# Patient Record
Sex: Female | Born: 1935 | Race: White | Hispanic: No | Smoking: Never smoker
Health system: Southern US, Community
[De-identification: ages and names within clinical notes are randomized; demographics above are authoritative.]

## PROBLEM LIST (undated history)

## (undated) ENCOUNTER — Emergency Department (HOSPITAL_COMMUNITY): Admission: EM | Payer: Medicare Other | Source: Home / Self Care

## (undated) DIAGNOSIS — D649 Anemia, unspecified: Secondary | ICD-10-CM

## (undated) DIAGNOSIS — C649 Malignant neoplasm of unspecified kidney, except renal pelvis: Secondary | ICD-10-CM

## (undated) DIAGNOSIS — S7290XA Unspecified fracture of unspecified femur, initial encounter for closed fracture: Secondary | ICD-10-CM

## (undated) DIAGNOSIS — R110 Nausea: Secondary | ICD-10-CM

## (undated) DIAGNOSIS — Z9289 Personal history of other medical treatment: Secondary | ICD-10-CM

## (undated) DIAGNOSIS — R262 Difficulty in walking, not elsewhere classified: Secondary | ICD-10-CM

## (undated) DIAGNOSIS — IMO0001 Reserved for inherently not codable concepts without codable children: Secondary | ICD-10-CM

## (undated) DIAGNOSIS — R296 Repeated falls: Secondary | ICD-10-CM

## (undated) DIAGNOSIS — C719 Malignant neoplasm of brain, unspecified: Secondary | ICD-10-CM

## (undated) DIAGNOSIS — F329 Major depressive disorder, single episode, unspecified: Secondary | ICD-10-CM

## (undated) DIAGNOSIS — C3490 Malignant neoplasm of unspecified part of unspecified bronchus or lung: Secondary | ICD-10-CM

## (undated) DIAGNOSIS — M6281 Muscle weakness (generalized): Secondary | ICD-10-CM

## (undated) DIAGNOSIS — F32A Depression, unspecified: Secondary | ICD-10-CM

## (undated) DIAGNOSIS — Z860100 Personal history of colon polyps, unspecified: Secondary | ICD-10-CM

## (undated) HISTORY — PX: NEPHRECTOMY: SHX65

## (undated) HISTORY — DX: Personal history of colonic polyps: Z86.010

## (undated) HISTORY — DX: Personal history of colon polyps, unspecified: Z86.0100

## (undated) HISTORY — DX: Nausea: R11.0

## (undated) HISTORY — DX: Reserved for inherently not codable concepts without codable children: IMO0001

## (undated) HISTORY — PX: WRIST FRACTURE SURGERY: SHX121

## (undated) HISTORY — DX: Anemia, unspecified: D64.9

## (undated) HISTORY — DX: Depression, unspecified: F32.A

## (undated) HISTORY — PX: ROTATOR CUFF REPAIR: SHX139

## (undated) HISTORY — PX: TOTAL HIP ARTHROPLASTY: SHX124

## (undated) HISTORY — PX: LUNG LOBECTOMY: SHX167

## (undated) HISTORY — PX: FOOT SURGERY: SHX648

## (undated) HISTORY — DX: Major depressive disorder, single episode, unspecified: F32.9

## (undated) SURGERY — Surgical Case
Anesthesia: *Unknown

---

## 1999-12-02 ENCOUNTER — Ambulatory Visit (HOSPITAL_COMMUNITY): Admission: RE | Admit: 1999-12-02 | Discharge: 1999-12-02 | Payer: Self-pay | Admitting: Urology

## 1999-12-02 ENCOUNTER — Encounter: Payer: Self-pay | Admitting: Urology

## 1999-12-05 ENCOUNTER — Encounter: Payer: Self-pay | Admitting: Urology

## 1999-12-09 ENCOUNTER — Encounter: Payer: Self-pay | Admitting: Urology

## 1999-12-09 ENCOUNTER — Inpatient Hospital Stay (HOSPITAL_COMMUNITY): Admission: RE | Admit: 1999-12-09 | Discharge: 1999-12-14 | Payer: Self-pay | Admitting: Urology

## 1999-12-09 ENCOUNTER — Encounter (INDEPENDENT_AMBULATORY_CARE_PROVIDER_SITE_OTHER): Payer: Self-pay | Admitting: *Deleted

## 2000-04-01 ENCOUNTER — Encounter: Payer: Self-pay | Admitting: Urology

## 2000-04-01 ENCOUNTER — Encounter: Admission: RE | Admit: 2000-04-01 | Discharge: 2000-04-01 | Payer: Self-pay | Admitting: Urology

## 2000-10-08 ENCOUNTER — Encounter: Admission: RE | Admit: 2000-10-08 | Discharge: 2000-10-08 | Payer: Self-pay | Admitting: Urology

## 2000-10-08 ENCOUNTER — Encounter: Payer: Self-pay | Admitting: Urology

## 2000-10-09 ENCOUNTER — Inpatient Hospital Stay (HOSPITAL_COMMUNITY): Admission: RE | Admit: 2000-10-09 | Discharge: 2000-10-11 | Payer: Self-pay | Admitting: Specialist

## 2000-11-16 ENCOUNTER — Encounter: Payer: Self-pay | Admitting: Urology

## 2000-11-16 ENCOUNTER — Encounter: Admission: RE | Admit: 2000-11-16 | Discharge: 2000-11-16 | Payer: Self-pay | Admitting: Urology

## 2001-01-06 ENCOUNTER — Encounter: Payer: Self-pay | Admitting: Urology

## 2001-01-06 ENCOUNTER — Encounter: Admission: RE | Admit: 2001-01-06 | Discharge: 2001-01-06 | Payer: Self-pay | Admitting: Urology

## 2001-04-14 ENCOUNTER — Encounter: Admission: RE | Admit: 2001-04-14 | Discharge: 2001-04-14 | Payer: Self-pay | Admitting: Thoracic Surgery

## 2001-04-14 ENCOUNTER — Encounter: Payer: Self-pay | Admitting: Thoracic Surgery

## 2001-08-26 ENCOUNTER — Encounter: Payer: Self-pay | Admitting: Thoracic Surgery

## 2001-08-26 ENCOUNTER — Encounter: Admission: RE | Admit: 2001-08-26 | Discharge: 2001-08-26 | Payer: Self-pay | Admitting: Thoracic Surgery

## 2001-10-21 ENCOUNTER — Encounter: Payer: Self-pay | Admitting: Urology

## 2001-10-21 ENCOUNTER — Encounter: Admission: RE | Admit: 2001-10-21 | Discharge: 2001-10-21 | Payer: Self-pay | Admitting: Urology

## 2002-02-10 ENCOUNTER — Encounter: Payer: Self-pay | Admitting: Thoracic Surgery

## 2002-02-10 ENCOUNTER — Encounter: Admission: RE | Admit: 2002-02-10 | Discharge: 2002-02-10 | Payer: Self-pay | Admitting: Thoracic Surgery

## 2002-08-25 ENCOUNTER — Encounter: Admission: RE | Admit: 2002-08-25 | Discharge: 2002-08-25 | Payer: Self-pay | Admitting: Thoracic Surgery

## 2002-08-25 ENCOUNTER — Encounter: Payer: Self-pay | Admitting: Thoracic Surgery

## 2003-03-14 ENCOUNTER — Encounter: Admission: RE | Admit: 2003-03-14 | Discharge: 2003-03-14 | Payer: Self-pay | Admitting: Thoracic Surgery

## 2003-10-25 ENCOUNTER — Ambulatory Visit (HOSPITAL_BASED_OUTPATIENT_CLINIC_OR_DEPARTMENT_OTHER): Admission: RE | Admit: 2003-10-25 | Discharge: 2003-10-25 | Payer: Self-pay | Admitting: Orthopedic Surgery

## 2005-10-15 ENCOUNTER — Encounter: Admission: RE | Admit: 2005-10-15 | Discharge: 2005-10-15 | Payer: Self-pay | Admitting: Thoracic Surgery

## 2005-10-21 ENCOUNTER — Ambulatory Visit (HOSPITAL_COMMUNITY): Admission: RE | Admit: 2005-10-21 | Discharge: 2005-10-21 | Payer: Self-pay | Admitting: Thoracic Surgery

## 2005-10-23 ENCOUNTER — Ambulatory Visit (HOSPITAL_COMMUNITY): Admission: RE | Admit: 2005-10-23 | Discharge: 2005-10-23 | Payer: Self-pay | Admitting: Thoracic Surgery

## 2005-10-23 ENCOUNTER — Encounter (INDEPENDENT_AMBULATORY_CARE_PROVIDER_SITE_OTHER): Payer: Self-pay | Admitting: Specialist

## 2005-11-18 ENCOUNTER — Encounter (INDEPENDENT_AMBULATORY_CARE_PROVIDER_SITE_OTHER): Payer: Self-pay | Admitting: Specialist

## 2005-11-18 ENCOUNTER — Inpatient Hospital Stay (HOSPITAL_COMMUNITY): Admission: RE | Admit: 2005-11-18 | Discharge: 2005-11-22 | Payer: Self-pay | Admitting: Thoracic Surgery

## 2005-11-20 ENCOUNTER — Ambulatory Visit: Payer: Self-pay | Admitting: Oncology

## 2005-11-25 ENCOUNTER — Ambulatory Visit: Payer: Self-pay | Admitting: Oncology

## 2005-12-03 ENCOUNTER — Encounter: Admission: RE | Admit: 2005-12-03 | Discharge: 2005-12-03 | Payer: Self-pay | Admitting: Thoracic Surgery

## 2005-12-23 LAB — CBC WITH DIFFERENTIAL/PLATELET
Basophils Absolute: 0 10*3/uL (ref 0.0–0.1)
EOS%: 4.2 % (ref 0.0–7.0)
Eosinophils Absolute: 0.2 10*3/uL (ref 0.0–0.5)
HCT: 35.1 % (ref 34.8–46.6)
HGB: 12.1 g/dL (ref 11.6–15.9)
MCH: 31.4 pg (ref 26.0–34.0)
MCV: 91.1 fL (ref 81.0–101.0)
MONO%: 9.9 % (ref 0.0–13.0)
NEUT%: 52.5 % (ref 39.6–76.8)
Platelets: 267 10*3/uL (ref 145–400)

## 2005-12-23 LAB — COMPREHENSIVE METABOLIC PANEL
AST: 23 U/L (ref 0–37)
Alkaline Phosphatase: 103 U/L (ref 39–117)
BUN: 23 mg/dL (ref 6–23)
Calcium: 9.4 mg/dL (ref 8.4–10.5)
Creatinine, Ser: 0.99 mg/dL (ref 0.40–1.20)
Glucose, Bld: 68 mg/dL — ABNORMAL LOW (ref 70–99)

## 2005-12-24 ENCOUNTER — Encounter: Admission: RE | Admit: 2005-12-24 | Discharge: 2005-12-24 | Payer: Self-pay | Admitting: Thoracic Surgery

## 2006-01-22 ENCOUNTER — Ambulatory Visit (HOSPITAL_COMMUNITY): Admission: RE | Admit: 2006-01-22 | Discharge: 2006-01-22 | Payer: Self-pay | Admitting: Oncology

## 2006-02-11 ENCOUNTER — Encounter: Admission: RE | Admit: 2006-02-11 | Discharge: 2006-02-11 | Payer: Self-pay | Admitting: Thoracic Surgery

## 2006-03-31 ENCOUNTER — Ambulatory Visit: Payer: Self-pay | Admitting: Oncology

## 2006-05-13 ENCOUNTER — Encounter: Admission: RE | Admit: 2006-05-13 | Discharge: 2006-05-13 | Payer: Self-pay | Admitting: Thoracic Surgery

## 2006-08-10 ENCOUNTER — Ambulatory Visit: Payer: Self-pay | Admitting: Oncology

## 2006-08-13 LAB — COMPREHENSIVE METABOLIC PANEL
Albumin: 4.3 g/dL (ref 3.5–5.2)
Alkaline Phosphatase: 90 U/L (ref 39–117)
BUN: 33 mg/dL — ABNORMAL HIGH (ref 6–23)
CO2: 25 mEq/L (ref 19–32)
Calcium: 9.1 mg/dL (ref 8.4–10.5)
Glucose, Bld: 98 mg/dL (ref 70–99)
Potassium: 4.6 mEq/L (ref 3.5–5.3)
Sodium: 138 mEq/L (ref 135–145)
Total Protein: 6.8 g/dL (ref 6.0–8.3)

## 2006-08-13 LAB — CBC WITH DIFFERENTIAL/PLATELET
Basophils Absolute: 0 10*3/uL (ref 0.0–0.1)
Eosinophils Absolute: 0.1 10*3/uL (ref 0.0–0.5)
HGB: 11.7 g/dL (ref 11.6–15.9)
MCV: 90.7 fL (ref 81.0–101.0)
MONO#: 0.4 10*3/uL (ref 0.1–0.9)
MONO%: 7.3 % (ref 0.0–13.0)
NEUT#: 2.6 10*3/uL (ref 1.5–6.5)
Platelets: 267 10*3/uL (ref 145–400)
RDW: 12.8 % (ref 11.3–14.5)
WBC: 5 10*3/uL (ref 3.9–10.0)

## 2006-08-17 ENCOUNTER — Ambulatory Visit (HOSPITAL_COMMUNITY): Admission: RE | Admit: 2006-08-17 | Discharge: 2006-08-17 | Payer: Self-pay | Admitting: Oncology

## 2006-12-01 ENCOUNTER — Ambulatory Visit: Payer: Self-pay | Admitting: Oncology

## 2006-12-13 IMAGING — CR DG CHEST 1V PORT
1 series · 1 of 1 positions shown · non-contrast
Comparison: 11/19/2005.

CLINICAL DATA: Lung nodule.  Chest tube follow-up.
 PORTABLE CHEST, ONE VIEW ? 11/20/2005 ? (4475 HOURS):

[view not recorded]
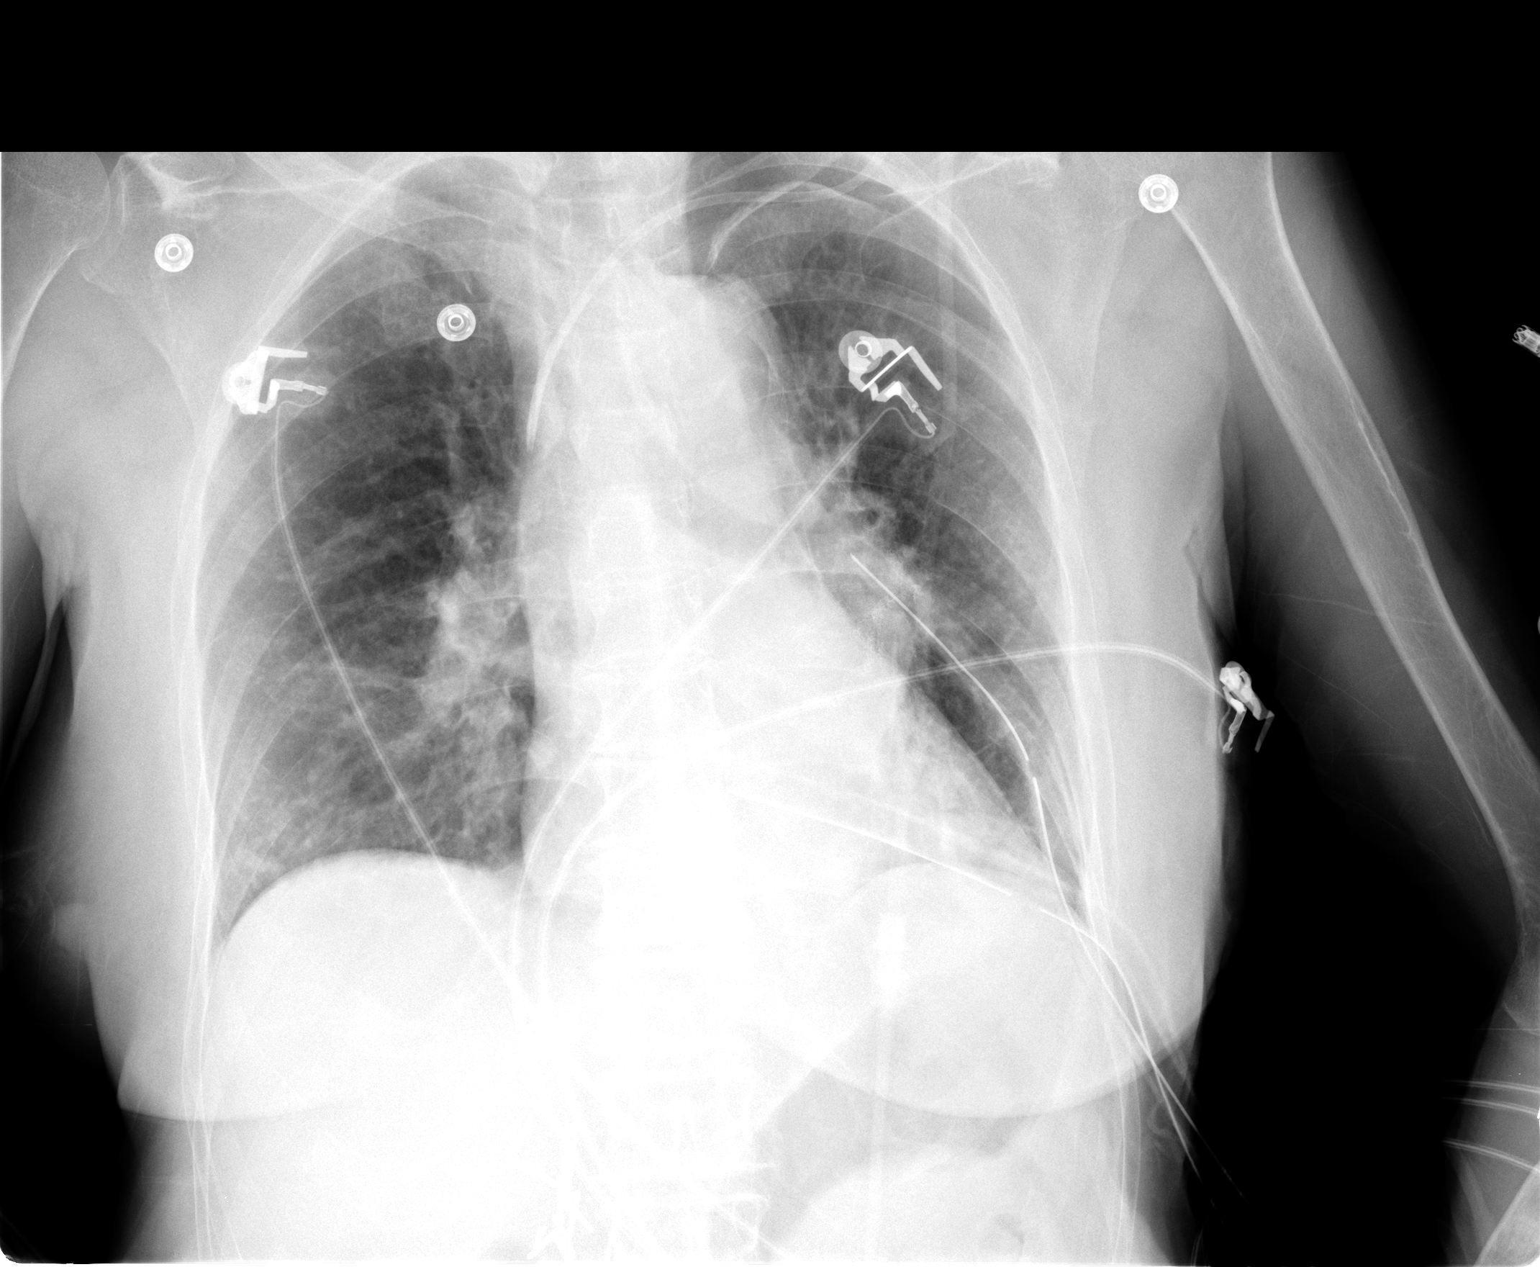

[1 of 1 positions shown; findings below may reference images not displayed]

FINDINGS: The lungs are better aerated.  There is little change in small left apical pneumothorax with two left chest tubes present.  A left central venous line is unchanged.
IMPRESSION: No change in left apical pneumothorax with two left chest tubes remaining.  Slightly better aeration.

## 2007-03-02 ENCOUNTER — Ambulatory Visit: Payer: Self-pay | Admitting: Oncology

## 2007-03-04 LAB — CBC WITH DIFFERENTIAL/PLATELET
Basophils Absolute: 0 10*3/uL (ref 0.0–0.1)
Eosinophils Absolute: 0.1 10*3/uL (ref 0.0–0.5)
HGB: 11.8 g/dL (ref 11.6–15.9)
MCV: 91.3 fL (ref 81.0–101.0)
MONO#: 0.4 10*3/uL (ref 0.1–0.9)
MONO%: 8.2 % (ref 0.0–13.0)
NEUT#: 2.6 10*3/uL (ref 1.5–6.5)
RBC: 3.64 10*6/uL — ABNORMAL LOW (ref 3.70–5.32)
RDW: 12.6 % (ref 11.3–14.5)
WBC: 4.8 10*3/uL (ref 3.9–10.0)
lymph#: 1.7 10*3/uL (ref 0.9–3.3)

## 2007-03-04 LAB — COMPREHENSIVE METABOLIC PANEL
Albumin: 4.2 g/dL (ref 3.5–5.2)
Alkaline Phosphatase: 84 U/L (ref 39–117)
BUN: 32 mg/dL — ABNORMAL HIGH (ref 6–23)
Calcium: 8.6 mg/dL (ref 8.4–10.5)
Chloride: 107 mEq/L (ref 96–112)
Creatinine, Ser: 1 mg/dL (ref 0.40–1.20)
Glucose, Bld: 89 mg/dL (ref 70–99)
Potassium: 4.4 mEq/L (ref 3.5–5.3)

## 2007-03-08 ENCOUNTER — Ambulatory Visit (HOSPITAL_COMMUNITY): Admission: RE | Admit: 2007-03-08 | Discharge: 2007-03-08 | Payer: Self-pay | Admitting: Oncology

## 2007-07-12 ENCOUNTER — Ambulatory Visit: Payer: Self-pay | Admitting: Oncology

## 2007-07-14 LAB — COMPREHENSIVE METABOLIC PANEL
ALT: 13 U/L (ref 0–35)
AST: 26 U/L (ref 0–37)
Alkaline Phosphatase: 75 U/L (ref 39–117)
Calcium: 8.7 mg/dL (ref 8.4–10.5)
Chloride: 104 mEq/L (ref 96–112)
Creatinine, Ser: 1.06 mg/dL (ref 0.40–1.20)
Total Bilirubin: 0.8 mg/dL (ref 0.3–1.2)

## 2007-07-14 LAB — CBC WITH DIFFERENTIAL/PLATELET
BASO%: 0.9 % (ref 0.0–2.0)
EOS%: 2.5 % (ref 0.0–7.0)
HCT: 34 % — ABNORMAL LOW (ref 34.8–46.6)
MCH: 31.4 pg (ref 26.0–34.0)
MCHC: 34.3 g/dL (ref 32.0–36.0)
MONO%: 8.8 % (ref 0.0–13.0)
NEUT%: 49.7 % (ref 39.6–76.8)
lymph#: 1.5 10*3/uL (ref 0.9–3.3)

## 2007-07-28 ENCOUNTER — Ambulatory Visit (HOSPITAL_COMMUNITY): Admission: RE | Admit: 2007-07-28 | Discharge: 2007-07-28 | Payer: Self-pay | Admitting: Oncology

## 2007-11-09 ENCOUNTER — Ambulatory Visit: Payer: Self-pay | Admitting: Oncology

## 2007-11-12 LAB — CBC WITH DIFFERENTIAL/PLATELET
BASO%: 0.8 % (ref 0.0–2.0)
MCHC: 34.7 g/dL (ref 32.0–36.0)
MONO#: 0.4 10*3/uL (ref 0.1–0.9)
RBC: 3.61 10*6/uL — ABNORMAL LOW (ref 3.70–5.32)
RDW: 13.4 % (ref 11.3–14.5)
WBC: 4.8 10*3/uL (ref 3.9–10.0)
lymph#: 1.8 10*3/uL (ref 0.9–3.3)

## 2007-11-12 LAB — COMPREHENSIVE METABOLIC PANEL
ALT: 13 U/L (ref 0–35)
CO2: 26 mEq/L (ref 19–32)
Calcium: 8.9 mg/dL (ref 8.4–10.5)
Chloride: 107 mEq/L (ref 96–112)
Sodium: 141 mEq/L (ref 135–145)
Total Bilirubin: 0.4 mg/dL (ref 0.3–1.2)
Total Protein: 6.3 g/dL (ref 6.0–8.3)

## 2007-11-12 LAB — LACTATE DEHYDROGENASE: LDH: 149 U/L (ref 94–250)

## 2008-03-23 ENCOUNTER — Ambulatory Visit: Payer: Self-pay | Admitting: Oncology

## 2008-03-23 ENCOUNTER — Ambulatory Visit (HOSPITAL_COMMUNITY): Admission: RE | Admit: 2008-03-23 | Discharge: 2008-03-23 | Payer: Self-pay | Admitting: Oncology

## 2008-03-23 LAB — COMPREHENSIVE METABOLIC PANEL
Albumin: 4.2 g/dL (ref 3.5–5.2)
CO2: 24 mEq/L (ref 19–32)
Glucose, Bld: 86 mg/dL (ref 70–99)
Potassium: 4.4 mEq/L (ref 3.5–5.3)
Sodium: 142 mEq/L (ref 135–145)
Total Protein: 6.6 g/dL (ref 6.0–8.3)

## 2008-03-23 LAB — CBC WITH DIFFERENTIAL/PLATELET
Basophils Absolute: 0 10*3/uL (ref 0.0–0.1)
Eosinophils Absolute: 0.1 10*3/uL (ref 0.0–0.5)
HGB: 11.9 g/dL (ref 11.6–15.9)
LYMPH%: 36.5 % (ref 14.0–48.0)
MONO#: 0.3 10*3/uL (ref 0.1–0.9)
NEUT#: 2.3 10*3/uL (ref 1.5–6.5)
Platelets: 232 10*3/uL (ref 145–400)
RBC: 3.65 10*6/uL — ABNORMAL LOW (ref 3.70–5.32)
WBC: 4.3 10*3/uL (ref 3.9–10.0)

## 2008-03-23 LAB — LACTATE DEHYDROGENASE: LDH: 154 U/L (ref 94–250)

## 2008-05-29 ENCOUNTER — Inpatient Hospital Stay (HOSPITAL_COMMUNITY): Admission: EM | Admit: 2008-05-29 | Discharge: 2008-06-06 | Payer: Self-pay | Admitting: Orthopedic Surgery

## 2008-10-03 ENCOUNTER — Ambulatory Visit: Payer: Self-pay | Admitting: Oncology

## 2008-10-05 LAB — COMPREHENSIVE METABOLIC PANEL
Albumin: 4 g/dL (ref 3.5–5.2)
Alkaline Phosphatase: 86 U/L (ref 39–117)
BUN: 27 mg/dL — ABNORMAL HIGH (ref 6–23)
Creatinine, Ser: 1.13 mg/dL (ref 0.40–1.20)
Glucose, Bld: 83 mg/dL (ref 70–99)
Potassium: 4.3 mEq/L (ref 3.5–5.3)
Total Bilirubin: 0.6 mg/dL (ref 0.3–1.2)

## 2008-10-05 LAB — CBC WITH DIFFERENTIAL/PLATELET
Basophils Absolute: 0 10*3/uL (ref 0.0–0.1)
EOS%: 3.8 % (ref 0.0–7.0)
Eosinophils Absolute: 0.2 10*3/uL (ref 0.0–0.5)
HCT: 33.8 % — ABNORMAL LOW (ref 34.8–46.6)
HGB: 11.6 g/dL (ref 11.6–15.9)
LYMPH%: 36.2 % (ref 14.0–49.7)
MCH: 32.4 pg (ref 25.1–34.0)
MCV: 94.2 fL (ref 79.5–101.0)
MONO%: 8.6 % (ref 0.0–14.0)
NEUT#: 2.1 10*3/uL (ref 1.5–6.5)
NEUT%: 50.6 % (ref 38.4–76.8)
Platelets: 269 10*3/uL (ref 145–400)

## 2008-10-05 LAB — LACTATE DEHYDROGENASE: LDH: 138 U/L (ref 94–250)

## 2008-10-12 ENCOUNTER — Ambulatory Visit (HOSPITAL_COMMUNITY): Admission: RE | Admit: 2008-10-12 | Discharge: 2008-10-12 | Payer: Self-pay | Admitting: Oncology

## 2009-03-09 ENCOUNTER — Ambulatory Visit: Payer: Self-pay | Admitting: Oncology

## 2009-03-13 LAB — CBC WITH DIFFERENTIAL/PLATELET
BASO%: 1.2 % (ref 0.0–2.0)
Basophils Absolute: 0 10*3/uL (ref 0.0–0.1)
EOS%: 2.7 % (ref 0.0–7.0)
HCT: 34.7 % — ABNORMAL LOW (ref 34.8–46.6)
HGB: 11.8 g/dL (ref 11.6–15.9)
MCH: 33 pg (ref 25.1–34.0)
MCHC: 34 g/dL (ref 31.5–36.0)
MCV: 96.9 fL (ref 79.5–101.0)
MONO%: 8.1 % (ref 0.0–14.0)
NEUT%: 58 % (ref 38.4–76.8)
RDW: 12.7 % (ref 11.2–14.5)
lymph#: 1.2 10*3/uL (ref 0.9–3.3)

## 2009-03-13 LAB — COMPREHENSIVE METABOLIC PANEL
ALT: 16 U/L (ref 0–35)
AST: 24 U/L (ref 0–37)
Alkaline Phosphatase: 74 U/L (ref 39–117)
BUN: 29 mg/dL — ABNORMAL HIGH (ref 6–23)
Chloride: 106 mEq/L (ref 96–112)
Creatinine, Ser: 1.13 mg/dL (ref 0.40–1.20)

## 2009-07-09 ENCOUNTER — Ambulatory Visit (HOSPITAL_COMMUNITY): Admission: RE | Admit: 2009-07-09 | Discharge: 2009-07-09 | Payer: Self-pay | Admitting: Oncology

## 2009-07-09 ENCOUNTER — Ambulatory Visit: Payer: Self-pay | Admitting: Oncology

## 2009-07-11 LAB — COMPREHENSIVE METABOLIC PANEL
ALT: 12 U/L (ref 0–35)
CO2: 25 mEq/L (ref 19–32)
Calcium: 9 mg/dL (ref 8.4–10.5)
Chloride: 106 mEq/L (ref 96–112)
Creatinine, Ser: 1.26 mg/dL — ABNORMAL HIGH (ref 0.40–1.20)
Glucose, Bld: 82 mg/dL (ref 70–99)
Sodium: 141 mEq/L (ref 135–145)
Total Bilirubin: 0.7 mg/dL (ref 0.3–1.2)
Total Protein: 6.8 g/dL (ref 6.0–8.3)

## 2009-07-11 LAB — CBC WITH DIFFERENTIAL/PLATELET
BASO%: 0.7 % (ref 0.0–2.0)
Eosinophils Absolute: 0.1 10*3/uL (ref 0.0–0.5)
HCT: 34.7 % — ABNORMAL LOW (ref 34.8–46.6)
LYMPH%: 38.3 % (ref 14.0–49.7)
MCHC: 34.8 g/dL (ref 31.5–36.0)
MONO#: 0.3 10*3/uL (ref 0.1–0.9)
NEUT#: 1.6 10*3/uL (ref 1.5–6.5)
NEUT%: 48.5 % (ref 38.4–76.8)
Platelets: 238 10*3/uL (ref 145–400)
WBC: 3.4 10*3/uL — ABNORMAL LOW (ref 3.9–10.3)
lymph#: 1.3 10*3/uL (ref 0.9–3.3)

## 2009-07-11 LAB — LACTATE DEHYDROGENASE: LDH: 147 U/L (ref 94–250)

## 2010-01-08 ENCOUNTER — Ambulatory Visit: Payer: Self-pay | Admitting: Oncology

## 2010-01-15 LAB — COMPREHENSIVE METABOLIC PANEL
Alkaline Phosphatase: 69 U/L (ref 39–117)
BUN: 23 mg/dL (ref 6–23)
CO2: 25 mEq/L (ref 19–32)
Creatinine, Ser: 1.24 mg/dL — ABNORMAL HIGH (ref 0.40–1.20)
Glucose, Bld: 89 mg/dL (ref 70–99)
Total Bilirubin: 0.6 mg/dL (ref 0.3–1.2)

## 2010-01-15 LAB — CBC WITH DIFFERENTIAL/PLATELET
Basophils Absolute: 0 10*3/uL (ref 0.0–0.1)
Eosinophils Absolute: 0.1 10*3/uL (ref 0.0–0.5)
HCT: 34 % — ABNORMAL LOW (ref 34.8–46.6)
LYMPH%: 44.1 % (ref 14.0–49.7)
MCV: 93.2 fL (ref 79.5–101.0)
MONO#: 0.4 10*3/uL (ref 0.1–0.9)
MONO%: 10.3 % (ref 0.0–14.0)
NEUT#: 1.4 10*3/uL — ABNORMAL LOW (ref 1.5–6.5)
NEUT%: 40.8 % (ref 38.4–76.8)
Platelets: 229 10*3/uL (ref 145–400)
RBC: 3.64 10*6/uL — ABNORMAL LOW (ref 3.70–5.45)

## 2010-01-15 LAB — LACTATE DEHYDROGENASE: LDH: 142 U/L (ref 94–250)

## 2010-01-25 ENCOUNTER — Ambulatory Visit (HOSPITAL_COMMUNITY): Admission: RE | Admit: 2010-01-25 | Discharge: 2010-01-25 | Payer: Self-pay | Admitting: Oncology

## 2010-02-19 ENCOUNTER — Encounter: Admission: RE | Admit: 2010-02-19 | Discharge: 2010-02-19 | Payer: Self-pay | Admitting: Oncology

## 2010-04-10 ENCOUNTER — Ambulatory Visit (HOSPITAL_COMMUNITY)
Admission: AD | Admit: 2010-04-10 | Discharge: 2010-04-11 | Payer: Self-pay | Source: Home / Self Care | Attending: Diagnostic Radiology | Admitting: Diagnostic Radiology

## 2010-04-10 ENCOUNTER — Encounter (INDEPENDENT_AMBULATORY_CARE_PROVIDER_SITE_OTHER): Payer: Self-pay | Admitting: Diagnostic Radiology

## 2010-05-22 ENCOUNTER — Encounter
Admission: RE | Admit: 2010-05-22 | Discharge: 2010-05-22 | Payer: Self-pay | Source: Home / Self Care | Attending: Oncology | Admitting: Oncology

## 2010-07-15 LAB — CBC
HCT: 27 % — ABNORMAL LOW (ref 36.0–46.0)
MCH: 31.4 pg (ref 26.0–34.0)
MCV: 94.1 fL (ref 78.0–100.0)
Platelets: 285 10*3/uL (ref 150–400)
RBC: 3.62 MIL/uL — ABNORMAL LOW (ref 3.87–5.11)
RDW: 12.3 % (ref 11.5–15.5)
RDW: 12.6 % (ref 11.5–15.5)
WBC: 5.5 10*3/uL (ref 4.0–10.5)
WBC: 7 10*3/uL (ref 4.0–10.5)

## 2010-07-15 LAB — BASIC METABOLIC PANEL
BUN: 17 mg/dL (ref 6–23)
BUN: 23 mg/dL (ref 6–23)
CO2: 25 mEq/L (ref 19–32)
Chloride: 107 mEq/L (ref 96–112)
Creatinine, Ser: 1.2 mg/dL (ref 0.4–1.2)
GFR calc Af Amer: 53 mL/min — ABNORMAL LOW (ref >60)
GFR calc non Af Amer: 44 mL/min — ABNORMAL LOW (ref >60)
Potassium: 4.1 mEq/L (ref 3.5–5.1)

## 2010-07-15 LAB — APTT: aPTT: 35 seconds (ref 24–37)

## 2010-07-15 LAB — CROSSMATCH
ABO/RH(D): A POS
Antibody Screen: NEGATIVE
Unit division: 0

## 2010-07-15 LAB — PROTIME-INR: INR: 0.91 (ref 0.00–1.49)

## 2010-07-25 ENCOUNTER — Other Ambulatory Visit: Payer: Self-pay | Admitting: Diagnostic Radiology

## 2010-07-25 ENCOUNTER — Other Ambulatory Visit: Payer: Self-pay | Admitting: Oncology

## 2010-07-25 DIAGNOSIS — N2889 Other specified disorders of kidney and ureter: Secondary | ICD-10-CM

## 2010-08-14 ENCOUNTER — Ambulatory Visit
Admission: RE | Admit: 2010-08-14 | Discharge: 2010-08-14 | Disposition: A | Discharge status: Home / Self Care | Payer: PRIVATE HEALTH INSURANCE | Source: Ambulatory Visit | Attending: Oncology | Admitting: Oncology

## 2010-08-14 ENCOUNTER — Ambulatory Visit
Admission: RE | Admit: 2010-08-14 | Discharge: 2010-08-14 | Disposition: A | Discharge status: Home / Self Care | Payer: PRIVATE HEALTH INSURANCE | Source: Ambulatory Visit | Attending: Diagnostic Radiology | Admitting: Diagnostic Radiology

## 2010-08-14 DIAGNOSIS — N2889 Other specified disorders of kidney and ureter: Secondary | ICD-10-CM

## 2010-08-14 MED ORDER — IOHEXOL 300 MG/ML  SOLN
80.0000 mL | Freq: Once | INTRAMUSCULAR | Status: AC | PRN
  Administered 2010-08-14: 80 mL via INTRAVENOUS

## 2010-08-15 ENCOUNTER — Encounter: Payer: Self-pay | Admitting: Cardiovascular Disease

## 2010-08-19 ENCOUNTER — Ambulatory Visit (INDEPENDENT_AMBULATORY_CARE_PROVIDER_SITE_OTHER): Payer: PRIVATE HEALTH INSURANCE | Admitting: Cardiovascular Disease

## 2010-08-19 ENCOUNTER — Encounter: Payer: Self-pay | Admitting: Cardiovascular Disease

## 2010-08-19 VITALS — BP 100/63 | HR 93 | Wt 107.0 lb

## 2010-08-19 DIAGNOSIS — R42 Dizziness and giddiness: Secondary | ICD-10-CM | POA: Insufficient documentation

## 2010-08-19 DIAGNOSIS — R0600 Dyspnea, unspecified: Secondary | ICD-10-CM | POA: Insufficient documentation

## 2010-08-19 DIAGNOSIS — R55 Syncope and collapse: Secondary | ICD-10-CM

## 2010-08-19 DIAGNOSIS — R0989 Other specified symptoms and signs involving the circulatory and respiratory systems: Secondary | ICD-10-CM

## 2010-08-19 LAB — CBC
HCT: 23.1 % — ABNORMAL LOW (ref 36.0–46.0)
HCT: 26.1 % — ABNORMAL LOW (ref 36.0–46.0)
HCT: 30.9 % — ABNORMAL LOW (ref 36.0–46.0)
Hemoglobin: 10.5 g/dL — ABNORMAL LOW (ref 12.0–15.0)
Hemoglobin: 8.8 g/dL — ABNORMAL LOW (ref 12.0–15.0)
MCHC: 33.2 g/dL (ref 30.0–36.0)
MCHC: 33.7 g/dL (ref 30.0–36.0)
MCHC: 33.8 g/dL (ref 30.0–36.0)
MCV: 92.3 fL (ref 78.0–100.0)
MCV: 94.6 fL (ref 78.0–100.0)
MCV: 95.2 fL (ref 78.0–100.0)
MCV: 95.8 fL (ref 78.0–100.0)
Platelets: 145 10*3/uL — ABNORMAL LOW (ref 150–400)
Platelets: 169 10*3/uL (ref 150–400)
RBC: 3.27 MIL/uL — ABNORMAL LOW (ref 3.87–5.11)
RBC: 3.7 MIL/uL — ABNORMAL LOW (ref 3.87–5.11)
RDW: 12.4 % (ref 11.5–15.5)
RDW: 13 % (ref 11.5–15.5)
RDW: 14.9 % (ref 11.5–15.5)
WBC: 5.5 10*3/uL (ref 4.0–10.5)
WBC: 7.2 10*3/uL (ref 4.0–10.5)

## 2010-08-19 LAB — CROSSMATCH: ABO/RH(D): A POS

## 2010-08-19 LAB — BASIC METABOLIC PANEL
BUN: 13 mg/dL (ref 6–23)
BUN: 14 mg/dL (ref 6–23)
BUN: 19 mg/dL (ref 6–23)
CO2: 23 mEq/L (ref 19–32)
CO2: 30 mEq/L (ref 19–32)
Calcium: 8.3 mg/dL — ABNORMAL LOW (ref 8.4–10.5)
Chloride: 103 mEq/L (ref 96–112)
Chloride: 107 mEq/L (ref 96–112)
Chloride: 97 mEq/L (ref 96–112)
Chloride: 99 mEq/L (ref 96–112)
Creatinine, Ser: 0.95 mg/dL (ref 0.4–1.2)
Creatinine, Ser: 0.95 mg/dL (ref 0.4–1.2)
GFR calc Af Amer: >60 mL/min (ref >60)
GFR calc Af Amer: >60 mL/min (ref >60)
GFR calc non Af Amer: 54 mL/min — ABNORMAL LOW (ref >60)
Glucose, Bld: 110 mg/dL — ABNORMAL HIGH (ref 70–99)
Glucose, Bld: 110 mg/dL — ABNORMAL HIGH (ref 70–99)
Glucose, Bld: 128 mg/dL — ABNORMAL HIGH (ref 70–99)
Glucose, Bld: 170 mg/dL — ABNORMAL HIGH (ref 70–99)
Potassium: 4 mEq/L (ref 3.5–5.1)
Potassium: 4 mEq/L (ref 3.5–5.1)
Potassium: 4.3 mEq/L (ref 3.5–5.1)
Sodium: 133 mEq/L — ABNORMAL LOW (ref 135–145)
Sodium: 134 mEq/L — ABNORMAL LOW (ref 135–145)

## 2010-08-19 NOTE — Assessment & Plan Note (Signed)
The patient's dyspnea is likely multifactorial due to overall deterioration in functional status since her recent diagnosis with the current renal cell carcinoma. She has no symptoms of chest pain or discomfort. Other than her age, she does not have risk factors for coronary artery disease. Her ECG does not show any ischemic changes. I don't think a stress test is needed at this time. However, would proceed with a transthoracic echocardiogram to evaluate her LV systolic function as well as possibility of pericardial effusion given her history of cancer. At treating urologist is Dr. Richarda Overlie in [place].

## 2010-08-19 NOTE — Assessment & Plan Note (Signed)
She was found to be slightly orthostatic recently which explains her symptoms. Blood pressure is borderline low. I advised her to take precautions when she changes position suddenly and not to stand up too fast. I also encouraged her to increase her fluid and sodium intake. We'll recheck this on followup. She is still orthostatic, we'll consider adding Midodrine.

## 2010-08-19 NOTE — Progress Notes (Signed)
HPI  [name] is a 77 year old female who is referred by Dr. Sherral Hammers for further evaluation of dizziness and slightly abnormal ECG. The patient has history of renal cell carcinoma status post right nephrectomy about 10 years ago. She had recurrence in her lung that required resection. She was recently found to have a tumor of her left kidney likely related to the same process. She is being treated for that. Since then, the patient had gradual  decline in her functional capacity. She does feel dizzy and lightheaded especially when she stands up. There has been no syncope or presyncope. She is known to have relatively low blood pressure for many years. She does not mild exertional dyspnea but that has not changed significantly recently. She continues to lose weight. She has no previous cardiac history. She had an ECG done recently which showed nonspecific ST changes in the anterior leads. She denies any PND, orthopnea or lower extremity edema.  No Known Allergies   Current Outpatient Prescriptions on File Prior to Visit  Medication Sig Dispense Refill  . buPROPion (WELLBUTRIN SR) 150 MG 12 hr tablet Take 1 tablet by mouth Twice daily.      . Calcium Carbonate-Vitamin D (CALCIUM + D PO) Take by mouth daily.        . Multiple Vitamin (MULTIVITAMIN) tablet Take 1 tablet by mouth daily.        . Omega-3 Fatty Acids (FISH OIL) 1200 MG CAPS Take by mouth daily.        Marland Kitchen omeprazole (PRILOSEC) 20 MG capsule Take 1 tablet by mouth daily.      Marland Kitchen PARoxetine (PAXIL) 10 MG tablet Take 1 tablet by mouth daily.         Past Medical History  Diagnosis Date  . Depression   . Nausea      Past Surgical History  Procedure Date  . Nephrectomy     right  . Rotator cuff repair     right  . Foot surgery   . Lung lobectomy   . Total hip arthroplasty      Family History  Problem Relation Age of Onset  . Breast cancer    . Colon cancer    . Heart disease    . Heart attack       History    Social History  . Marital Status: Widowed    Spouse Name: N/A    Number of Children: N/A  . Years of Education: N/A   Occupational History  . Not on file.   Social History Main Topics  . Smoking status: Never Smoker   . Smokeless tobacco: Not on file  . Alcohol Use: No  . Drug Use: No  . Sexually Active: Not on file   Other Topics Concern  . Not on file   Social History Narrative  . No narrative on file     ROS Constitutional: Negative for fever, chills and diaphoresis. HENT: Negative for hearing loss, nosebleeds, congestion, sore throat, facial swelling, drooling, trouble swallowing, neck pain, voice change, sinus pressure and tinnitus.  Eyes: Negative for photophobia, pain, discharge and visual disturbance.  Respiratory: Negative for apnea, cough, chest tightness and wheezing.  Cardiovascular: Negative for chest pain, palpitations and leg swelling.  Gastrointestinal: Negative for nausea, vomiting, abdominal pain, diarrhea, constipation, blood in stool and abdominal distention.  Genitourinary: Negative for dysuria, urgency, frequency, hematuria and decreased urine volume.  Musculoskeletal: Negative for myalgias, back pain, joint swelling, arthralgias and gait problem.  Skin: Negative  for color change, pallor, rash and wound.  Neurological: Negative for  tremors, seizures, syncope, speech difficulty, weakness, numbness and headaches.  Psychiatric/Behavioral: Negative for suicidal ideas, hallucinations, behavioral problems and agitation. The patient is not nervous/anxious.     PHYSICAL EXAM   BP 100/63  Pulse 93  Wt 107 lb (48.535 kg)  SpO2 96% Constitutional: She is oriented to person, place, and time. She appears underweight. No distress.  HENT: No nasal discharge.  Head: Normocephalic and atraumatic.  Eyes: Pupils are equal, round, and reactive to light. Right eye exhibits no discharge. Left eye exhibits no discharge.  Neck: Normal range of motion. Neck supple.  No JVD present. No thyromegaly present.  Cardiovascular: Normal rate, regular rhythm, normal heart sounds and intact distal pulses. Exam reveals no gallop and no friction rub.  No murmur heard.  Pulmonary/Chest: Effort normal and breath sounds normal. No stridor. No respiratory distress. She has no wheezes. She has no rales. She exhibits no tenderness.  Abdominal: Soft. Bowel sounds are normal. She exhibits no distension. There is no tenderness. There is no rebound and no guarding.  Musculoskeletal: Normal range of motion. She exhibits no edema and no tenderness.  Neurological: She is alert and oriented to person, place, and time. Coordination normal.  Skin: Skin is warm and dry. No rash noted. She is not diaphoretic. No erythema. No pallor.  Psychiatric: She has a normal mood and affect. Her behavior is normal. Judgment and thought content normal.     EKG: Normal sinus rhythm with no significant ST or T wave changes. There is no evidence of prior infarct.  ASSESSMENT AND PLAN

## 2010-08-26 ENCOUNTER — Ambulatory Visit (INDEPENDENT_AMBULATORY_CARE_PROVIDER_SITE_OTHER): Payer: PRIVATE HEALTH INSURANCE | Admitting: Cardiovascular Disease

## 2010-08-26 DIAGNOSIS — R0989 Other specified symptoms and signs involving the circulatory and respiratory systems: Secondary | ICD-10-CM

## 2010-08-26 DIAGNOSIS — R0600 Dyspnea, unspecified: Secondary | ICD-10-CM

## 2010-08-26 DIAGNOSIS — R42 Dizziness and giddiness: Secondary | ICD-10-CM

## 2010-08-27 ENCOUNTER — Other Ambulatory Visit (HOSPITAL_COMMUNITY): Payer: Self-pay | Admitting: Diagnostic Radiology

## 2010-08-29 ENCOUNTER — Other Ambulatory Visit: Payer: Self-pay

## 2010-08-29 ENCOUNTER — Encounter: Payer: Self-pay | Admitting: Cardiovascular Disease

## 2010-08-29 ENCOUNTER — Other Ambulatory Visit (HOSPITAL_COMMUNITY): Payer: Self-pay | Admitting: Diagnostic Radiology

## 2010-08-29 DIAGNOSIS — N289 Disorder of kidney and ureter, unspecified: Secondary | ICD-10-CM

## 2010-08-29 DIAGNOSIS — T43695A Adverse effect of other psychostimulants, initial encounter: Secondary | ICD-10-CM

## 2010-08-29 NOTE — Progress Notes (Signed)
HPI  [name] is a 77 year old female who was seen recently for  evaluation of dizziness and slightly abnormal ECG. The patient has history of renal cell carcinoma status post right nephrectomy about 10 years ago. She had recurrence in her lung that required resection. She was recently found to have a tumor of her left kidney likely related to the same process. She is being treated for that. Since then, the patient had gradual  decline in her functional capacity. She does feel dizzy and lightheaded especially when she stands up. There has been no syncope or presyncope. She is known to have relatively low blood pressure for many years. She does not mild exertional dyspnea but that has not changed significantly recently. She continues to lose weight. She has no previous cardiac history. She had an ECG done recently which showed nonspecific ST changes in the anterior leads. She denies any PND, orthopnea or lower extremity edema. Has been no syncope since her last visit. She does have orthostatic dizziness. However, her symptoms are overall mild.  No Known Allergies   Current Outpatient Prescriptions on File Prior to Visit  Medication Sig Dispense Refill  . buPROPion (WELLBUTRIN SR) 150 MG 12 hr tablet Take 1 tablet by mouth Twice daily.      . Calcium Carbonate-Vitamin D (CALCIUM + D PO) Take by mouth daily.        . folic acid (FOLVITE) 400 MCG tablet Take 400 mcg by mouth daily.        . Multiple Vitamin (MULTIVITAMIN) tablet Take 1 tablet by mouth daily.        . Omega-3 Fatty Acids (FISH OIL) 1200 MG CAPS Take by mouth daily.        Marland Kitchen omeprazole (PRILOSEC) 20 MG capsule Take 1 tablet by mouth daily.      Marland Kitchen PARoxetine (PAXIL) 10 MG tablet Take 1 tablet by mouth daily.      . vitamin B-12 (CYANOCOBALAMIN) 1000 MCG tablet Take 1,000 mcg by mouth daily.           Past Medical History  Diagnosis Date  . Depression   . Nausea      Past Surgical History  Procedure Date  . Nephrectomy       right  . Rotator cuff repair     right  . Foot surgery   . Lung lobectomy   . Total hip arthroplasty      Family History  Problem Relation Age of Onset  . Breast cancer    . Colon cancer    . Heart disease    . Heart attack       History   Social History  . Marital Status: Widowed    Spouse Name: N/A    Number of Children: N/A  . Years of Education: N/A   Occupational History  . Not on file.   Social History Main Topics  . Smoking status: Never Smoker   . Smokeless tobacco: Not on file  . Alcohol Use: No  . Drug Use: No  . Sexually Active: Not on file   Other Topics Concern  . Not on file   Social History Narrative  . No narrative on file     ROS Constitutional: Negative for fever, chills and diaphoresis. HENT: Negative for hearing loss, nosebleeds, congestion, sore throat, facial swelling, drooling, trouble swallowing, neck pain, voice change, sinus pressure and tinnitus.  Eyes: Negative for photophobia, pain, discharge and visual disturbance.  Respiratory: Negative for apnea, cough,  chest tightness and wheezing.  Cardiovascular: Negative for chest pain, palpitations and leg swelling.  Gastrointestinal: Negative for nausea, vomiting, abdominal pain, diarrhea, constipation, blood in stool and abdominal distention.  Genitourinary: Negative for dysuria, urgency, frequency, hematuria and decreased urine volume.  Musculoskeletal: Negative for myalgias, back pain, joint swelling, arthralgias and gait problem.  Skin: Negative for color change, pallor, rash and wound.  Neurological: Negative for  tremors, seizures, syncope, speech difficulty, weakness, numbness and headaches.  Psychiatric/Behavioral: Negative for suicidal ideas, hallucinations, behavioral problems and agitation. The patient is not nervous/anxious.     PHYSICAL EXAM   BP 94/59  Pulse 81  Ht 5\' 4"  (1.626 m)  Wt 110 lb (49.896 kg)  BMI 18.88 kg/m2  SpO2 93% Constitutional: She is oriented  to person, place, and time. She appears underweight. No distress.  HENT: No nasal discharge.  Head: Normocephalic and atraumatic.  Eyes: Pupils are equal, round, and reactive to light. Right eye exhibits no discharge. Left eye exhibits no discharge.  Neck: Normal range of motion. Neck supple. No JVD present. No thyromegaly present.  Cardiovascular: Normal rate, regular rhythm, normal heart sounds and intact distal pulses. Exam reveals no gallop and no friction rub.  No murmur heard.  Pulmonary/Chest: Effort normal and breath sounds normal. No stridor. No respiratory distress. She has no wheezes. She has no rales. She exhibits no tenderness.  Abdominal: Soft. Bowel sounds are normal. She exhibits no distension. There is no tenderness. There is no rebound and no guarding.  Musculoskeletal: Normal range of motion. She exhibits no edema and no tenderness.  Neurological: She is alert and oriented to person, place, and time. Coordination normal.  Skin: Skin is warm and dry. No rash noted. She is not diaphoretic. No erythema. No pallor.  Psychiatric: She has a normal mood and affect. Her behavior is normal. Judgment and thought content normal.      ASSESSMENT AND PLAN

## 2010-08-29 NOTE — Assessment & Plan Note (Signed)
Blood pressure is borderline low. Her echocardiogram showed normal LV systolic function, trace mitral and tricuspid regurgitation, mild to moderate aortic insufficiency, no evidence of pericardial effusion.  I advised her to take precautions when she changes position suddenly and not to stand up too fast. I also encouraged her to increase her fluid and sodium intake. If her symptoms get worse in the future, consider adding Midodrine.

## 2010-08-29 NOTE — Assessment & Plan Note (Signed)
The patient's dyspnea is likely multifactorial due to overall deterioration in functional status since her recent diagnosis with the current renal cell carcinoma.  Her echocardiogram showed normal LV systolic function and no evidence of pulmonary hypertension.

## 2010-09-05 ENCOUNTER — Other Ambulatory Visit (HOSPITAL_COMMUNITY): Payer: Medicare Other

## 2010-09-05 ENCOUNTER — Other Ambulatory Visit: Payer: Self-pay | Admitting: Diagnostic Radiology

## 2010-09-05 ENCOUNTER — Telehealth: Payer: Self-pay | Admitting: Cardiovascular Disease

## 2010-09-05 ENCOUNTER — Encounter (HOSPITAL_COMMUNITY): Payer: Medicare Other | Attending: Diagnostic Radiology

## 2010-09-05 DIAGNOSIS — Z79899 Other long term (current) drug therapy: Secondary | ICD-10-CM | POA: Insufficient documentation

## 2010-09-05 DIAGNOSIS — Z01812 Encounter for preprocedural laboratory examination: Secondary | ICD-10-CM | POA: Insufficient documentation

## 2010-09-05 DIAGNOSIS — F329 Major depressive disorder, single episode, unspecified: Secondary | ICD-10-CM | POA: Insufficient documentation

## 2010-09-05 DIAGNOSIS — F3289 Other specified depressive episodes: Secondary | ICD-10-CM | POA: Insufficient documentation

## 2010-09-05 DIAGNOSIS — C649 Malignant neoplasm of unspecified kidney, except renal pelvis: Secondary | ICD-10-CM | POA: Insufficient documentation

## 2010-09-05 LAB — BASIC METABOLIC PANEL
BUN: 26 mg/dL — ABNORMAL HIGH (ref 6–23)
Creatinine, Ser: 1.31 mg/dL — ABNORMAL HIGH (ref 0.4–1.2)
GFR calc Af Amer: 48 mL/min — ABNORMAL LOW (ref >60)
GFR calc non Af Amer: 40 mL/min — ABNORMAL LOW (ref >60)
Potassium: 4.3 mEq/L (ref 3.5–5.1)

## 2010-09-05 LAB — CBC
MCV: 90.9 fL (ref 78.0–100.0)
Platelets: 238 10*3/uL (ref 150–400)
RBC: 3.86 MIL/uL — ABNORMAL LOW (ref 3.87–5.11)
RDW: 13 % (ref 11.5–15.5)
WBC: 5.5 10*3/uL (ref 4.0–10.5)

## 2010-09-05 LAB — PROTIME-INR
INR: 1.01 (ref 0.00–1.49)
Prothrombin Time: 13.5 seconds (ref 11.6–15.2)

## 2010-09-05 NOTE — Telephone Encounter (Signed)
Faxed OV & EKG to [facility] @ [facility] ([phone]).

## 2010-09-08 ENCOUNTER — Ambulatory Visit (HOSPITAL_COMMUNITY)
Admission: RE | Admit: 2010-09-08 | Discharge: 2010-09-08 | Disposition: A | Discharge status: Home / Self Care | Payer: Medicare Other | Source: Ambulatory Visit | Attending: Diagnostic Radiology | Admitting: Diagnostic Radiology

## 2010-09-08 DIAGNOSIS — Z905 Acquired absence of kidney: Secondary | ICD-10-CM | POA: Insufficient documentation

## 2010-09-08 DIAGNOSIS — N289 Disorder of kidney and ureter, unspecified: Secondary | ICD-10-CM | POA: Insufficient documentation

## 2010-09-08 DIAGNOSIS — K869 Disease of pancreas, unspecified: Secondary | ICD-10-CM | POA: Insufficient documentation

## 2010-09-08 DIAGNOSIS — Z85528 Personal history of other malignant neoplasm of kidney: Secondary | ICD-10-CM | POA: Insufficient documentation

## 2010-09-08 DIAGNOSIS — J984 Other disorders of lung: Secondary | ICD-10-CM | POA: Insufficient documentation

## 2010-09-08 MED ORDER — GADOBENATE DIMEGLUMINE 529 MG/ML IV SOLN
5.0000 mL | Freq: Once | INTRAVENOUS | Status: AC | PRN
  Administered 2010-09-08: 5 mL via INTRAVENOUS

## 2010-09-09 ENCOUNTER — Encounter: Payer: Self-pay | Admitting: Cardiovascular Disease

## 2010-09-13 ENCOUNTER — Ambulatory Visit (HOSPITAL_COMMUNITY)
Admission: RE | Admit: 2010-09-13 | Discharge: 2010-09-13 | Disposition: A | Discharge status: Home / Self Care | Payer: Medicare Other | Source: Ambulatory Visit | Attending: Diagnostic Radiology | Admitting: Diagnostic Radiology

## 2010-09-13 ENCOUNTER — Ambulatory Visit (HOSPITAL_COMMUNITY)
Admission: RE | Admit: 2010-09-13 | Discharge: 2010-09-14 | Disposition: A | Discharge status: Home / Self Care | Payer: Medicare Other | Source: Ambulatory Visit | Attending: Diagnostic Radiology | Admitting: Diagnostic Radiology

## 2010-09-13 DIAGNOSIS — Z79899 Other long term (current) drug therapy: Secondary | ICD-10-CM | POA: Insufficient documentation

## 2010-09-13 DIAGNOSIS — Z85528 Personal history of other malignant neoplasm of kidney: Secondary | ICD-10-CM | POA: Insufficient documentation

## 2010-09-13 DIAGNOSIS — D4959 Neoplasm of unspecified behavior of other genitourinary organ: Secondary | ICD-10-CM | POA: Insufficient documentation

## 2010-09-13 DIAGNOSIS — Z905 Acquired absence of kidney: Secondary | ICD-10-CM | POA: Insufficient documentation

## 2010-09-13 DIAGNOSIS — R109 Unspecified abdominal pain: Secondary | ICD-10-CM | POA: Insufficient documentation

## 2010-09-13 DIAGNOSIS — F329 Major depressive disorder, single episode, unspecified: Secondary | ICD-10-CM | POA: Insufficient documentation

## 2010-09-13 DIAGNOSIS — F3289 Other specified depressive episodes: Secondary | ICD-10-CM | POA: Insufficient documentation

## 2010-09-13 MED ORDER — IOHEXOL 300 MG/ML  SOLN
50.0000 mL | Freq: Once | INTRAMUSCULAR | Status: AC | PRN
  Administered 2010-09-13: 50 mL via INTRAVENOUS

## 2010-09-14 LAB — CBC
HCT: 29.3 % — ABNORMAL LOW (ref 36.0–46.0)
Hemoglobin: 9.7 g/dL — ABNORMAL LOW (ref 12.0–15.0)
MCV: 91.6 fL (ref 78.0–100.0)
RBC: 3.2 MIL/uL — ABNORMAL LOW (ref 3.87–5.11)
WBC: 5.6 10*3/uL (ref 4.0–10.5)

## 2010-09-14 LAB — BASIC METABOLIC PANEL
BUN: 25 mg/dL — ABNORMAL HIGH (ref 6–23)
CO2: 28 mEq/L (ref 19–32)
Chloride: 106 mEq/L (ref 96–112)
Glucose, Bld: 86 mg/dL (ref 70–99)
Potassium: 4.3 mEq/L (ref 3.5–5.1)

## 2010-09-16 ENCOUNTER — Other Ambulatory Visit: Payer: Self-pay | Admitting: Diagnostic Radiology

## 2010-09-16 DIAGNOSIS — N2889 Other specified disorders of kidney and ureter: Secondary | ICD-10-CM

## 2010-09-17 LAB — CROSSMATCH: Unit division: 0

## 2010-09-17 NOTE — Discharge Summary (Signed)
[name], HODGENS           ACCOUNT NO.:  [id number]   MEDICAL RECORD NO.:  [id number]          PATIENT TYPE:  INP   LOCATION:  1615                         FACILITY:  [facility]   PHYSICIAN:  Madlyn Frankel. Charlann Boxer, M.D.  DATE OF BIRTH:  03-07-1933   DATE OF ADMISSION:  05/29/2008  DATE OF DISCHARGE:  06/02/2008                               DISCHARGE SUMMARY   ADMISSION DIAGNOSES:  1. Osteoporosis.  2. Cancer.   DISCHARGE DIAGNOSES:  1. Osteoporosis.  2. Cancer.  3. Acute blood  loss anemia, transfused, resolved prior to discharge.   HISTORY OF PRESENT ILLNESS:  This 77 year old female fell while walking  at [facility] on her right hip, had immediate pain and ability to bear  weight.  X-rays revealed an intertrochanteric fracture.  She was  admitted to [facility].  Dr. Charlann Boxer performed a open reduction  and internal fixation with intramedullary nailing of her right  intertrochanteric fracture.   CONSULTATION:  None.   PROCEDURE:  Open reduction internal fixation of her right  intertrochanteric fracture with intramedullary nailing by surgeon Dr.  Durene Romans.   LABORATORY DATA:  CBC:  She did have some acute blood loss anemia with  hematocrit 23.1 on January 28 to be checked prior to her discharge to  make sure it was resolved.  Her platelet count was 145.  Metabolic panel  final reading:  Sodium  139, potassium 4, BUN 13, creatinine 0.95,  glucose 110.  Calcium 8.   RADIOLOGY:  Right hip operative view showed satisfactory reduction of  the right proximal femur fracture with intramedullary nailing.  Chest,  one-view, showed postsurgical changes of the left hilum.  No acute  cardiopulmonary disease.   EKG showed normal sinus rhythm with nonspecific ST and T-wave  abnormalities, abnormal EKG.   HOSPITAL COURSE:  The patient was admitted to the hospital.  Surgery was  performed on January 26.  She did have acute blood loss anemia, was  transfused on January 28.  She  was partial weightbearing 50% of her  right lower extremity.  She made moderate progress while in the  hospital.  Dressing was changed.  No significant drainage from the  wound. Staples were intact.  By the January 29, pending lab work if she  is stable, she will be ready for discharge to a  skilled nursing  facility for rehab in stable, improved condition.   DISCHARGE DISPOSITION:  To skilled nursing facility for rehab.   CONDITION ON DISCHARGE:  Stable, improved condition.   DISCHARGE PHYSICAL THERAPY:  Partial weightbearing 50% of her right  lower extremity with a rolling walker.  Goals of physical therapy will  be to promote independence in activities of daily living.  Include upper  body strengthening exercises as well as lower extremity ambulation.   DISCHARGE DIET:  Heart-healthy.   WOUND CARE:  Keep dry.  Cover dressing.  May shower.   DISCHARGE MEDICATIONS:  1. Lovenox 40 mg subcu q. 24 for 10 days.  2. Then start enteric-coated aspirin 325 mg one p.o. daily for 4 weeks      after Lovenox completed.  3. Robaxin 500 mg one p.o. q. 6 p.r.n. muscle spasm pain.  4. Iron 325 mg one p.o. t.i.d. for 3 weeks.  5. Colace 100 mg one p.o. b.i.d. p.r.n. constipation while on      narcotics.  6. MiraLax 17 grams p.o. daily p.r.n. constipation while on narcotics.  7. Norco 7.5/325 one to two p.o. q. 4-6  p.r.n. pain.  8. Folic acid 400 mcg p.o. daily.  9. Calcium 600 mg plus vitamin D 400 units p.o. daily.  10.Vitamin D [id number] units p.o. daily.  11.Complete multivitamin p.o. daily.  12.Wellbutrin 150 mg one p.o. b.i.d..   DISCHARGE FOLLOWUP:  Follow up with Dr. Charlann Boxer at phone number [phone] in  2 weeks for wound check and staple removal.     ______________________________  Yetta Glassman. [address], [place]      Madlyn Frankel. Charlann Boxer, M.D.  Electronically Signed    BLM/MEDQ  D:  06/02/2008  T:  06/02/2008  Job:  [id number]   cc:   Alanson Aly. Roxan Hockey, M.D.  Fax: [phone]

## 2010-09-17 NOTE — H&P (Signed)
[name], [name]           ACCOUNT NO.:  [id number]   MEDICAL RECORD NO.:  [id number]          PATIENT TYPE:  INP   LOCATION:  1615                         FACILITY:  [name]   PHYSICIAN:  Julie Frankel. Charlann [name], M.D.  DATE OF BIRTH:  [birth year]   DATE OF ADMISSION:  05/29/2008  DATE OF DISCHARGE:                              HISTORY & PHYSICAL   REASON FOR ADMISSION:  Right intertrochanteric fracture.   CHIEF COMPLAINTS:  Right hip pain.   HISTORY OF PRESENT ILLNESS:  A 77 year old female who fell while walking  at [name] onto her right hip, had immediate pain and inability to bear  weight.  She was taken to [name].  X-rays revealed a right  intertrochanteric fracture.  She was transferred to [name] over to [name], where Dr. Charlann [name] took over the care.  She was admitted with plans for reduction of her of right  intertrochanteric fracture.   PAST MEDICAL HISTORY:  Significant for cancer, osteoporosis.   PAST SURGICAL HISTORY:  Right radical nephrectomy, left partial lung  resection and segmentectomy.   FAMILY HISTORY:  Coronary artery disease, cancer.   SOCIAL HISTORY:  Widowed, nonsmoker.   ALLERGIES:  No known drug allergies.   MEDICATIONS:  1. Folic acid 400 mcg daily.  2. Calcium plus vitamin D 600/400 daily.  3. Vitamin D3 at 2000 units daily.  4. Multivitamin daily.  5. Wellbutrin 150 mg b.i.d.   REVIEW OF SYSTEMS:  See HPI.   PHYSICAL EXAMINATION:  Pulse 71, respirations 20, blood pressure 110/70.  GENERAL:  Awake, alert, oriented, well developed, well nourished.  NECK:  Supple.  No carotid bruits.  CHEST:  Lungs sounds diminished but clear.  BREASTS:  Deferred.  HEART:  S1-S2 are distinct.  ABDOMEN:  Soft, nontender, nondistended.  Bowel sounds present.  GENITOURINARY:  Deferred.  EXTREMITIES:  Right lower extremity shortened and externally rotated.  SKIN:  Warm and no cellulitis.  NEUROLOGIC:  Intact.  __________   LABORATORY DATA:  CBC white blood cells 6.9, hemoglobin 10.5, hematocrit  30.9 and platelets 202.  Metabolic:  Sodium 134, potassium 4.3, BUN 23,  creatinine 1.82, glucose 170.  Calcium 8.6.   Chest x-ray:  No acute disease.   EKG:  Normal sinus rhythm, nonspecific [facility] and T-wave abnormalities.   IMPRESSION:  Right intertrochanteric femur fracture.   PLAN OF ACTION:  Open reduction,  internal fixation for right  intertrochanteric fracture with intramedullary nailing by [name] on May 30, 2008.  Risks and complications were discussed with  the patient and family members present.  Questions were encouraged and  reviewed.   Plan of stay will be for 2 to 3 days based on rehab and physical therapy  status, will determine disposition to rehab versus home health care.     ______________________________  Yetta Glassman. [address], [place]      Julie Frankel. Charlann [name], M.D.  Electronically Signed    BLM/MEDQ  D:  05/30/2008  T:  05/30/2008  Job:  [id number]   cc:   [name]  Fax: [phone]

## 2010-09-17 NOTE — Op Note (Signed)
[name], [name]           ACCOUNT NO.:  [id number]   MEDICAL RECORD NO.:  [id number]          PATIENT TYPE:  INP   LOCATION:  1615                         FACILITY:  [facility]   PHYSICIAN:  Madlyn Frankel. Charlann Boxer, M.D.  DATE OF BIRTH:  July 11, 1932   DATE OF PROCEDURE:  05/30/2008  DATE OF DISCHARGE:                               OPERATIVE REPORT   PREOPERATIVE DIAGNOSIS:  Comminuted right intertrochanteric femur  fracture.   POSTOPERATIVE DIAGNOSIS:  Comminuted right intertrochanteric femur  fracture.   PROCEDURE:  Open reduction internal fixation of right intertrochanteric  femur fracture utilizing a DePuy troch nail 11 x 2 mm, 130 degrees, with  a 95-mm lag screw and a 34-mm distal interlock.   ASSISTANT:  Surgical technician.   ANESTHESIA:  General.   BLOOD LOSS:  100 mL.   SPECIMENS:  None.   FINDINGS:  None.   INDICATIONS FOR PROCEDURE:  This patient is a 77 year old female who  while at work and [employer] fell in an [place], landing on her right hip.  She had immediate onset pain.  She was seen and evaluated initially at  [facility] and requested transfer down to [facility] for  definitive management.  We discussed the risks and benefits of the  procedure and reviewed them with her family.  Consent was obtained for  proceeding with open reduction internal fixation.  Long-term sequelae  implications were all discussed and reviewed, in addition to standard  risks of infection, DVT, nonunion, delayed union, failure of hardware.   PROCEDURE IN DETAIL:  The patient was brought to the operative theater.  Once adequate anesthesia and preoperative antibiotics, Ancef,  administered, the patient was positioned supine on the fracture table.  The left leg was flexed and abducted out of the way with bony prominence  padded, particularly the peroneal nerve.   The right foot was placed in a traction shoe with padded traction  against the peroneal post.  Traction and internal  rotation were applied  and the fracture reduced into a near-anatomic position by radiographs in  both the AP and lateral planes.  Once I had the reduction down, the  right hip was prescrubbed, prepped, and draped in a sterile fashion  using a shower-curtain technique.  A time-out was performed, identifying  the patient, extremity, fracture, and procedure planned.   Landmarks were then reidentified under fluoroscopic imaging.  The  lateral-based incision was made in the proximal trochanter.  The guide  wire was then inserted into the tip of the trochanter, and the proximal  femur was drilled and then the nail passed by hand.  Once it was in its  correct orientation, I used the guide and passed a guide pin into the  center of femoral head in AP and lateral planes.  The tipped apex angle  appeared to be well within 20 mm.   I then drilled, tamped, and placed a 95-mm lag screw as predetermined by  measurement, allowing some compression through the fracture.   This was confirmed again in the AP and lateral planes.   Using the jig, the nail inserting jig, I placed the distal  interlock  measuring 34 mm to the 200-mm static position.   Wounds were irrigated.  Final radiographs were obtained once I removed  the insertion device.  The proximal wound was closed in layers with #1  Vicryl in the gluteal fascia, and 2-0 Vicryl was then used in the subcu  layers.  Staples on skin.  The hip was then cleaned, dried, and dressed  sterilely with Mepilex dressing.  She was brought to the recovery room,  extubated, in stable condition, tolerated the procedure well.      Madlyn Frankel Charlann Boxer, M.D.  Electronically Signed     MDO/MEDQ  D:  05/30/2008  T:  05/31/2008  Job:  [id number]

## 2010-09-18 ENCOUNTER — Other Ambulatory Visit: Payer: Self-pay | Admitting: Oncology

## 2010-09-20 NOTE — H&P (Signed)
[name], [name]           ACCOUNT NO.:  [id number]   MEDICAL RECORD NO.:  [id number]          PATIENT TYPE:  INP   LOCATION:  NA                           FACILITY:  [facility]   PHYSICIAN:  Ines Bloomer, M.D. DATE OF BIRTH:  01/26/1933   DATE OF ADMISSION:  11/18/2005  DATE OF DISCHARGE:                                HISTORY & PHYSICAL   HISTORY OF PRESENT ILLNESS:  This 77 year old patient in 2001 had a right  radical nephrectomy for renal cell cancer.  She was then found to have some  nodules in the lung, particularly in the right lower lobe, and has been  followed with serial CT scans since then.  A recent CT scan showed a new  right middle lobe nodule, some hilar adenopathy and a left lower lobe nodule  that has also enlarged.  This left lower lobe nodule was first seen in  January 2007 and then we repeated her CT scan.  We did a PET scan on her,  which showed the left lower lobe lesion to enlarge to 1.8 x 1.2 cm, which is  obviously increased in size.  There is also a small 7 mm nodule that has not  changed.  Bronchoscopy and mediastinoscopy was done and showed no malignant  cells and the mediastinal nodes were also negative.  She has no had no  hemoptysis, fever, chills or weight loss but because of her left lower lobe  lesion, she is admitted for a left VATS and resection of this lesion.   PAST MEDICAL HISTORY:  She has been in really good health except for the  nephrectomy.   She is on no present medications.   She has had previous right shoulder surgery.   She has been off and on Zoloft somewhat in the past.   FAMILY HISTORY:  Positive for heart disease as well as cancer of the colon  and breast cancer.   SOCIAL HISTORY:  She is widowed, has 2 children.  Works at [employer].  Does  not smoke and does not drink.   REVIEW OF SYSTEMS:  She is 115 pounds, 5 feet 4 inches.  Some slight loss of  appetite and slight weight loss.  CARDIAC:  No angina or atrial  fibrillation.  PULMONARY:  No bronchitis,  hemoptysis or asthma.  GASTROINTESTINAL:  No nausea, vomiting, constipation or diarrhea.  KIDNEY:  See history of present illness.  VASCULAR:  No claudication, DVT or TIAs.  NEUROLOGIC:  No headaches, blackout or seizures.  ORTHOPEDIC:  No joint  pains.  PSYCHIATRIC:  She has been intermittently treated for situational  depression.  EYE/ENT:  No change in her eyesight or hearing.  HEMATOLOGIC:  No problems with anemia.   PHYSICAL EXAMINATION:  GENERAL:  She is a well-developed Caucasian female in  no acute distress.  VITAL SIGNS:  Her blood pressure is 100/64, pulse 100, respirations 18,  saturation 95%.  HEENT:  Head is atraumatic.  Eyes:  Pupils are equal, react to light and  accommodation.  Extraocular movements are normal.  Ears:  Tympanic membranes  are intact.  Nose:  There is  no septal deviation.  Throat is without lesion.  NECK:  Supple without thyromegaly.  LUNGS:  Clear to auscultation and percussion.  CARDIAC:  Regular sinus rhythm.  ABDOMEN:  Soft.  There is no hepatosplenomegaly.  Bowel sounds are normal.  There is no clubbing or edema.  NEUROLOGIC:  Intact.   IMPRESSION:  1.  Status post right nephrectomy for renal cell cancer.  2.  Enlarging left lower lobe lesion.   PLAN:  Left VATS, resection of left lower lobe lesion.           ______________________________  Ines Bloomer, M.D.     DPB/MEDQ  D:  11/14/2005  T:  11/14/2005  Job:  [id number]

## 2010-09-20 NOTE — Op Note (Signed)
. [facility]  Patient:    [name], [name]                  MRN: [id number] Proc. Date: 12/09/99 Adm. Date:  [id number] Attending:  Nelma Rothman Iii                           Operative Report  PREOPERATIVE DIAGNOSIS:  Large right renal mass.  POSTOPERATIVE DIAGNOSIS:  Large right renal mass.  OPERATIVE PROCEDURE:  Right radical nephrectomy supra 11th incision.  SURGEON:  Gaynelle Arabian, M.D.  ASSISTANT:  Maretta Bees. Vonita Moss, M.D.  ANESTHESIA:  General endotracheal anesthesia  ESTIMATED BLOOD LOSS:  350 cc  TUBES:  16 french Foley catheter and NG tube.  COMPLICATIONS:  None.  INDICATIONS FOR PROCEDURE:  Ms. Sperl is a very nice 77 year old white female presenting with gross painless hematuria, clots, and subsequently found to have a large right renal mass on CT scan and an obstructed kidney.  A stent was placed and subsequently removed.  MRI was performed and confirmed that the tumor was very large but localized to the kidney.  It was felt the renal vein ______ was present but there was no evidence of metastatic disease.  Understanding risks, benefits, and alternatives, and feeling thoracoabdominal incision may be indicated she elected to undergo radical nephrectomy.  PROCEDURE IN DETAIL:  The patient was placed in the supine position and was placed in a semiflat position.  A axillary roll was placed and she was prepped and draped with Betadine in a sterile fashion.  A 16 french Foley catheter was placed and a right subcostal incision was made and extended into the supra 11th rib area.  This was dissected down and the diaphragmatic fibers were dissected off the 11th and 12th rib and the pleura was retracted superiorly. The intercostal ligaments were incised and the rib cage opened to expose the area.  A very large tumor was noted to be well under the liver extending to the midline.  The line of Toldt was incised and the colon  along with the duodenum were reflected medially and dissection was carried to the kidney. The tumor was upper pole and displacing the primary portion of the kidney in the inferior position.  This was dissected outside Gerotas fascia and the ureter was clipped and cut.  Dissection was carried down to the renal hilum and a small anterior branch of the artery was identified along with a a large posterior branch.  These were tied to #1 silks proximally and 1 distally and cut and the dissection was carried to the upper pole.  There were dense desmoplastic reaction around the entire hilum and dissection was carried superiorly to the adrenal, and the adrenal was noted to be stuck to the vena cava and this was serially dissected out and approximately 3/4 of the adrenal gland was dissected with the specimen, again extra Gerotally.  The hilum was then approached.  The main renal artery was ligated proximally and distally to ties proximally and 1 distally and the vein was quite adherent to the vena cava.  It was felt that there was tumor probably in the vein, therefore a Satinsky clamp was placed on the edge of the vena cava and the the vein specimen was excised.  The overlying renal vein was closed with a running 5-0 Prolene suture, double armed, two runs, and the Satinsky clamp was released and good hemostasis was noted  to be present.  Thorough irrigation was performed.  The hilum was noted to be dry.  There was slight oozing of adrenal and layers were oversewn with 4-0 chromic catgut and Avitene was placed and again hemostasis was present.  The liver and colon were placed in the right upper quadrant.  Thorough irrigation was performed, an NG was placed, no other lesions were noted palpably normal but the left kidney appeared to be palpably normal and there were no abnormalities noted throughout the exploratory laparotomy.  The fascia was then closed with running PDS, and the posterior rectus  sheath and the internal oblique were closed in continuity, and then the anterior rectus sheath and the external oblique, and fascia was closed in continuity.  Irrigation was performed, and the skin was closed with skin staples, dressed in sterile fashion. The patient tolerated the procedure well.  No complications.  Specimen submitted to pathology. DD:  12/09/99 TD:  12/10/99 Job: [id number]

## 2010-09-20 NOTE — Procedures (Signed)
[facility]  Patient:    DALY, WHIPKEY                  MRN: [id number] Proc. Date: 12/09/99 Adm. Date:  [id number] Attending:  Nelma Rothman Iii                           Procedure Report  PROCEDURE:  Epidural catheter placement.  DIAGNOSIS:  Renal tumor.  INDICATIONS:  Ms. Dashonna Chagnon is a 78 year old white female who presents to the operating room for a radical nephrectomy.  The patient discussed her postoperative pain control options with her surgeon, Windy Fast L. Ovidio Hanger, M.D. and with the anesthesiologist.  After discussing the risks and benefits of this procedure, the patient decided on an epidural catheter placement.  The patient gave full consent for the risks and benefits for this procedure.  DESCRIPTION OF PROCEDURE:  Following a routine surgical case, the patient was placed in the left lateral decubitus position.  The patient remained under general anesthesia while the lumbar spine was sterilely prepped and draped. The 17 gauge Tuohy needle was advanced via loss of resistance technique at the L1-L2 interspace.  The epidural needle did not aspirate blood or CSF and the epidural catheter was then inserted approximately 4 cm into the epidural space.  The epidural needle was then removed and the catheter did not aspirate blood or CSF.  The catheter was then secured to the patients back with tape. The patient was given 15 mg of lidocaine and following this negative test dose, 2 cc of fentanyl diluted to 6 cc with normal saline were administered to the patient.  The patient was then placed in the supine position, extubated, and brought to the PACU where she was placed on a Marcaine/fentanyl infusion. The patient tolerated the procedure well. DD:  12/09/99 TD:  12/09/99 Job: [id number]

## 2010-09-20 NOTE — Consult Note (Signed)
[name], [name]           ACCOUNT NO.:  [id number]   MEDICAL RECORD NO.:  [id number]          PATIENT TYPE:  INP   LOCATION:  3305                         FACILITY:  [facility]   PHYSICIAN:  [name]        DATE OF BIRTH:  [birth year]   DATE OF CONSULTATION:  DATE OF DISCHARGE:                                   CONSULTATION   REASON FOR CONSULTATION:  Metastatic renal cell.   HISTORY OF PRESENT ILLNESS:  This 77 year old female with really no  significant past medical history was diagnosed back in 2001 with renal cell  carcinoma.  The patient presented with right flank pain and hematuria.  Subsequently under CT scan revealed a large right renal tumor with  obstruction.  She subsequently underwent a cystoscopy and placement and  stent in [place] and confirmed the presence of a large right kidney mass.  The patient subsequently had a right nephrectomy done on December 09, 1999 with  a tumor revealed to be T3, B clear cell histology.  The patient followed  closely by Dr. Earlene [name], however more recently the patient developed a left  lower lobe nodule first seen in January of 2007 and repeat CT scan revealed  an enlarging mass measuring 1.8 x 1.2 cm.  However, because it was not avid,  remained rather suspicious.  The patient subsequently underwent a left video  assisted thoracic surgery, mini thoracotomy and a wedge resection of the  left lower lobe lesion as well as a basal left lower lobe segmentectomy done  on November 18, 2005 with Dr. Edwyna [name].  The patient tolerated the procedure well  without any major complications with the pathology revealing that the wedge  resection of the left lower lobe revealed a metastatic carcinoma consistent  with renal cell.  The left lower lobe lobectomy did reveal metastatic  carcinoma with renal cell.  The margins were negative.  The vascular margins  were free of tumor.  Two level 12 lymph nodes with no evidence of metastatic  carcinoma.  There is  multiple lymph nodes biopsied and they were all  negative for metastatic carcinoma.  The patient is currently recovering well  and Dr. Edwyna [name] asked me to evaluate her for possible followup therapy.  Clinically, she reports that she is feeling well and she has had some  episodic confusion and memory loss.  Secondary to morphine pain is under  good control.   REVIEW OF SYSTEMS:  No double vision.  Does not report any fever, chills,  night sweats, weight loss.  No chest pain, shortness of breath, difficulty  breathing, no cough, hemoptysis.  No nausea, vomiting or abdominal pain.  No  hematochezia, melena, bleeding, clotting.  Rest unremarkable.   PAST MEDICAL HISTORY:  Essentially unremarkable.  She does have a history of  depression.   PAST SURGICAL HISTORY:  The previous surgery was as mentioned.  A  nephrectomy as well as a thoracotomy.   MEDICATIONS:  At the time of admission was on Zoloft, been on and off of it.   FAMILY HISTORY:  Positive for heart disease, also colon cancer and  breast  cancer.   SOCIAL HISTORY:  She is widowed.  She has 2 children.  She works at [employer].  Currently resides in [place].  Does not report any smoking or  drinking.   PHYSICAL EXAMINATION:  General: Alert, awake female does not appear in any  physical distress.  Vital signs: Her blood pressure is 102/40, pulse is 98, respirations 18.  Saturations 99% on 2 liters.  HEENT: Head is normocephalic, atraumatic.  Pupils equal, round and reactive  to light.  Mucous membranes moist and pink.  Neck: Supple without lymphadenopathy.  Heart: Regular rate and rhythm.  S1, S2.  Lungs: Clear to auscultation and percussion.  Abdomen: Soft, nontender.  No hepatosplenomegaly.  Extremities: No clubbing, cyanosis or edema.  Neurologic: Intact.  Motor, sensory and deep tendon reflexes.   LABORATORY DATA:  His blood counts showed a hemoglobin of 12.0, white cells  7.4, platelet count of 178.  Creatinine  1.0, bilirubin 1.0, potassium 3.9,  calcium is 8.5, LFTs are normal.   ASSESSMENT AND PLAN:  A very pleasant 77 year old female in excellent health  and shape.  In past medical history there is some mild depression diagnosed  with other large high risk renal cell tumor status post nephrectomy back in  2001.  Developed what appears to be metastatic disease in her lungs.  She  has 2 lesions.  She has underwent a mastectomy a couple of days ago under  Dr. Edwyna [name] and she is recovery well.  Had a long discussion today with Mr.  [name] discussing the risks and benefits of adjuvant therapy.  Explained  to her that there was a debate whether immunotherapy is of any value after  mastectomy.  Most recent study published in the Journal Of Urology/Oncology  reveals really no clear benefit of adjuvant immunotherapy after mastectomy.  I believe that a close surveillance after she is fully recovered would be  the way to go at this point.  It is certainly very provocative to think  about the inhibitors in the adjuvant setting but I think right now the  standard of care of surveillance.  I will set her up to followup with me in  about a months time at the [facility].  Thank you for allowing  me to participate in her care.           ______________________________  Julie Nicely. [facility]  Electronically Signed     FNS/MEDQ  D:  11/20/2005  T:  11/20/2005  Job:  [id number]   cc:   [name], M.D.

## 2010-09-20 NOTE — Discharge Summary (Signed)
[facility]  Patient:    [name], [name]              MRN: [id number] Adm. Date:  [id number] Disc. Date: [id number] Attending:  Pierce Crane                           Discharge Summary  There was no dictation for this report.  Dictation canceled. DD:  11/09/00 TD:  11/09/00 Job: [id number]

## 2010-09-20 NOTE — Op Note (Signed)
[name], Julie           ACCOUNT NO.:  [id number]   MEDICAL RECORD NO.:  [id number]          PATIENT TYPE:  AMB   LOCATION:  SDS                          FACILITY:  [facility]   PHYSICIAN:  Ines Bloomer, M.D. DATE OF BIRTH:  1932/06/29   DATE OF PROCEDURE:  10/23/2005  DATE OF DISCHARGE:                                 OPERATIVE REPORT   PREOPERATIVE DIAGNOSES:  1.  Left lobe mass.  2.  History of renal cancer.   POSTOPERATIVE DIAGNOSES:  1.  Left lobe mass.  2.  History of renal cancer.   OPERATION:  Video bronchoscopy.   After local anesthesia with Cetacaine, Xylocaine and IV sedation, the video  bronchoscope was passed through the mouth.  The cords were normal.  The  trachea was normal.  The carina was midline.  The right upper lobe and right  lower lobe orifices were normal but there were a lot of secretions in the  right middle lobe where she had had some previous changes and was thought to  have possible lesion there.  We irrigated out the secretions for culture and  no other lesions were seen.  Then we went to the left side.  The left main  stem bronchus was normal.  The left lower lobe was normal but we could see  the enlarging 1.8 cm lesion in the left upper lobe.  We did transbronchial  biopsies and brushings.  The video bronchoscope was removed.  The patient  returned to the recovery room in stable condition.           ______________________________  Ines Bloomer, M.D.     DPB/MEDQ  D:  10/23/2005  T:  10/23/2005  Job:  [id number]

## 2010-09-20 NOTE — Op Note (Signed)
NAME:  [name], [name]                     ACCOUNT NO.:  [id number]   MEDICAL RECORD NO.:  [id number]                   PATIENT TYPE:  AMB   LOCATION:  DSC                                  FACILITY:  [facility]   PHYSICIAN:  Leonides Grills, M.D.                  DATE OF BIRTH:  April 16, 1933   DATE OF PROCEDURE:  10/25/2003  DATE OF DISCHARGE:                                 OPERATIVE REPORT   PREOPERATIVE DIAGNOSES:  1. Left hallux valgus.  2. Left second hammer toe.   POSTOPERATIVE DIAGNOSES:  1. Left hallux valgus.  2. Left second hammer toe.   OPERATION PERFORMED:  1. Left chevron bunionectomy.  2. Left stress x-rays.  3. Left second toe metatarsophalangeal joint dorsal capsulotomy with     collateral release.  4. Left second toe proximal phalanx head resection.  5. Left second toe flexor digitorum longus to proximal phalanx transfer.  6. Left second toe extensor digitorum brevis to extensor digitorum longus     transfer.   SURGEON:  Leonides Grills, M.D.   ASSISTANT:  Lianne Cure, P.A.   ANESTHESIA:  Block with sedation.   ESTIMATED BLOOD LOSS:  Minimal.   TOURNIQUET TIME:  Approximately an hour.   COMPLICATIONS:  None.   DISPOSITION:  Stable to PR.   INDICATIONS FOR PROCEDURE:  The patient is a 77 year old female who has had  longstanding forefoot pain and discomfort that is interfering with her life  to the point where she can not do what she wants to do despite wider toe box  shoes and extra depth shoe.  The patient  has consented for the above  procedure.  All risks which include infection, neurovascular injury,  nonunion, malunion, hardware irritation, hardware failure, stiffness,  arthritis and recurrence of deformity were all explained, questions were  encouraged and answered.   DESCRIPTION OF PROCEDURE:  The patient was brought to the operating room and  placed in supine position after adequate general endotracheal tube  anesthesia was administered as  well as Ancef 1 g IV piggyback.  The left  lower extremity was then prepped and draped in sterile manner over a  proximally placed thigh tourniquet.  The limb was gravity exsanguinated and  the tourniquet was elevated to 290 mmHg.  A longitudinal incision was made  over the medial aspect of the left great toe MTP joint.  Dissection was  carried down through skin and hemostasis was obtained.  Neurovascular  structures identified both superiorly and inferiorly and protected  throughout the case.  L-shaped capsulotomy was then performed.  Simple  bunionectomy was then performed with a sagittal saw.  Lateral capsule was  then released with a curved Beaver blade.  Center of the head was then  identified.  Soft tissue was then elevated both superiorly and inferiorly  and Chevron osteotomy was then created.  Head was then translated  approximately 4 mm laterally.  This was then  fixed with a 2 mm fully  threaded cortical screw using a 1.5 mm drill hole respectively.  The head  was countersunk.  The redundant bone medially was then trimmed off with the  sagittal saw.  __________ ridge was then rounded off with a rongeur.  Capsule was advanced both superiorly and proximally and fixed with 2-0  Vicryl suture. This had excellent correction of the deformity and excellent  range of motion of the joint.  Stress x-rays were obtained in AP and lateral  planes and showed no gross motion at the osteotomy site.  Fixation was in  proper position and the deformity was corrected.  Area also to prior capsule  closure was copiously irrigated with normal saline including the joint.  The  longitudinal incision over the dorsal aspect of the second toe was then  made. Dissection was carried down through the skin and hemostasis was  obtained.  Extensor digitorum longus was tenotomized proximal and medial and  the brevis distal lateral.  The MTP joint was then identified and with a 15  blade scalpel, a dorsal capsulotomy  with collateral release was performed.  PIP joint was then entered and the distal aspect of the proximal phalanx was  skeletonized.  The head was then removed with a rongeur followed by a bone  cutter.  FDL tendon was then identified with a longitudinal incision in the  plantar plate and the tendon was tenotomized as distal as possible within  the wound.  A 2.5 and 3.5 mm drill hole respectively was then placed in the  proximal phalanx.  The FDL was then transferred through the drill hole from  plantar to dorsal.  A double ended trocar 0.045 K-wire was then placed  through the middle and distal phalanx antegrade.  PIP joint was then entered  and then fired retrograde.  With the toe held in reduced position with the  ankle in neutral dorsiflexion and tension on the FDL tendon through the  drill hole.  Once the K-wire was placed across the MTP joint, the toe was in  excellent alignment, the area was copiously irrigated with normal saline.  EDB to EDL was then transferred as well as connected to the FDL dorsally  with 3-0 PDS suture.  Tourniquet was deflated, hemostasis was obtained.  Skin relieving incisions were made on either side of the K-wire.  The K-wire  was bent, cut and capped.  The wound was closed with 4-0 nylon suture over  all wounds.  Sterile dressing was applied, a Programmer, multimedia dressing was  applied.  The patient was stable to the PR.                                               Leonides Grills, M.D.    PB/MEDQ  D:  10/25/2003  T:  10/26/2003  Job:  [id number]

## 2010-09-20 NOTE — Discharge Summary (Signed)
[facility]  Patient:    [name], [name]              MRN: [id number] Adm. Date:  [id number] Disc. Date: [id number] Attending:  Pierce Crane Dictator:   Della Goo, P.A.                           Discharge Summary  ADMISSION DIAGNOSES: 1. Rotator cuff tear and acromioclavicular joint arthrosis, right shoulder. 2. Osteoporosis. 3. History of right kidney cancer, status post right nephrectomy in August of    2001.  DISCHARGE DIAGNOSES: 1. Rotator cuff tear and acromioclavicular joint arthrosis, right shoulder. 2. Osteoporosis. 3. History of right kidney cancer, status post right nephrectomy in August of    2001. 4. Severe postoperative nausea.  PROCEDURES:  On October 09, 2000, the patient underwent open rotator cuff repair, distal clavicle resection, and excision of a spur at the mid anterior aspect of the acromion of the right shoulder.  This was performed by Javier Docker, M.D., assisted by Dorie Rank, P.A., under general anesthesia.  CONSULTATIONS:  None.  BRIEF HISTORY:  The patient is a 77 year old female with right shoulder pain refractory to conservative treatment.  MRI indicated rotator cuff tear, AC joint arthrosis, and prominent anterior subacromial spur.  It was felt that she would require surgical intervention and was admitted for the procedure as stated above.  BRIEF HOSPITAL COURSE:  The patient tolerated the procedure without difficulty.  Postoperatively neurovascular and motor function within the right upper extremity was noted to be intact.  The patient developed significant postoperative nausea and vomiting on the evening of the first postoperative day.  She was treated with antiemetics.  She vomited large amounts of food several times.  The morphine was discontinued and her analgesics were adjusted.  The following morning, her nausea and vomiting had resolved.  The patient was able to be out of bed and  ambulate to the bathroom.  Occupational therapy assisted with ADLs.  She was afebrile.  Vital signs were stable throughout the hospital stay.  The patients dressing was changed prior to discharge and her wound was found to have no signs of infection.  She was felt to be stable for discharge to home with her family.  PERTINENT LABORATORY VALUES:  Admission hemoglobin and hematocrit 11.1 and 32.7, respectively.  Her BMET on admission was normal with the exception of BUN 29.  The urinalysis on admission was negative for urinary tract infection.  The EKG on admission was normal sinus rhythm, occasional paroxysmal supraventricular complexes, and otherwise normal.  EKG with no old tracings for comparison.  DISPOSITION:  The patient was discharged to her home.  She was given instructions to wear her should immobilizer at all times.  DIET:  She will resume a regular diet.  WOUND CARE:  She will keep her incision clean and dry.  She has been instructed to change her dressing daily and has been given supplies to do so.  FOLLOW-UP:  She will follow up with Javier Docker, M.D., in the office two weeks from the date of her surgery and has been instructed to call the office to arrange an appointment.  DISCHARGE MEDICATIONS:  She will resume all of her home medications.  She is given prescriptions for Robaxin 500 mg one every eight hours as needed for spasm, Percocet 5 mg one to two every four to six hours as needed for pain.  SPECIAL  INSTRUCTIONS:  She will call the office if she has questions, concerns, or complications prior to her return office visit. DD:  11/09/00 TD:  11/09/00 Job: [id number]

## 2010-09-20 NOTE — Discharge Summary (Signed)
[place]  Patient:    DEBROH, SIELOFF                  MRN: [id number] Adm. Date:  [id number] Disc. Date: [id number] Attending:  Nelma Rothman Iii                           Discharge Summary  DIAGNOSIS:  Right renal tumor.  OPERATIVE PROCEDURE:  Right radical nephrectomy on December 09, 1999.  HISTORY AND PHYSICAL EXAMINATION:  Ms. Balthaser is a very nice 77 year old white female who presented with right flank pain and hematuria who subsequently underwent a CT scan which revealed a large right renal tumor with obstruction.  She underwent cystoscopy and placement of a double J stent in [place], [place], by Dr. ____________.  She was found to have a very large mass in the right kidney. She came for a second opinion and an MRI was subsequently obtained and there was questionable involvement of the vena cava and possibly pelvis; however, MRI revealed that there appeared to be no involvement of the pelvis. There was very large tumor in the renal vein but not in the inferior vena cava.  She subsequently, after understanding the risks, benefits, and alternatives, elected to undergo resection.  For past medical history, social history, family history, and review of systems, please seen history and physical for full details.  PHYSICAL EXAMINATION:  GENERAL APPEARANCE:  She is well-developed, well-nourished, thin and in no acute distress.  She is oriented x 3.  VITAL SIGNS:  She is afebrile and vital signs are stable.  HEENT:  PERRLA.  Ears, nose and throat clear.  NECK:  There are no masses or thyromegaly.  CHEST:  Clear in the posterior without rales or rhonchi.  ABDOMEN:  Soft and nontender without masses or organomegaly. A palpable right upper quadrant mass was noted.  EXTREMITIES:  Normal.  NEUROLOGICAL: Intact.  HOSPITAL COURSE: The patient was admitted after undergoing proper preoperative evaluation.  She was  subsequently taken to surgery on December 09, 1999, and underwent right radical nephrectomy uneventfully.  Postoperatively, she initially did well and epidural morphine was placed and subsequently initially postoperatively chem-7 was essentially normal.  Hemoglobin was 13.9, hematocrit 40.0 and white cell count was 12.5.  She was in ICU overnight, subsequently transferred to the floor on December 10, 1999.  The epidural fentanyl was not particularly working well.  She had pain on top of the incision and the superior portion of the incision.  Therefore, it was discontinued and she was begun on morphine PCA and IV Toradol.  Hemoglobin was 12.2, hematocrit 35, and white cell count 7.2. Chem-7 was essentially normal. By December 11, 1999, she was afebrile.  Chest was clear.  Abdomen was soft, slightly distended.  The NG tube was clamped and subsequently discontinued. The Foley was discontinued and Dulcolax suppositories were used.  Ambulation was promoted.  By December 12, 1999, T-max was 100.1 degrees F.  There were a few tape blisters noted on the incisional area.  Chest was clear and abdomen was soft.  She was advanced to a full diet.  She had some hallucinatory problems with morphine.  Also she had stopped her Zoloft and this was restarted.  Tylox was given for pain.  By December 13, 1999, she continued to progressively improve, tolerating a diet, much less teary, and was subsequently discharge on December 14, 1999.  She was voiding well,  eating well and incision was dry. She was to follow up for staple removal and specific instructions were given.  DISCHARGE MEDICATIONS: Tylox.  DISCHARGE CONDITION:  Improved.  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  This was discussed with her. DD:  01/07/00 TD:  01/07/00 Job: [id number]

## 2010-09-20 NOTE — Op Note (Signed)
[facility]  Patient:    [name], [name]              MRN: [id number] Proc. Date: 10/09/00 Adm. Date:  [id number] Attending:  Pierce Crane                           Operative Report  PREOPERATIVE DIAGNOSES:  Rotator cuff tear, acromioclavicular arthrosis.  POSTOPERATIVE DIAGNOSES:  Rotator cuff tear, acromioclavicular arthrosis.  PROCEDURE:  Open rotator cuff repair, distal clavicle resection, excision of a spur mid anterior aspect of the acromion.  ANESTHESIA:  General.  ASSISTANT:  Dorie Rank, P.A.-C.  BRIEF HISTORY AND INDICATIONS:  This is a 77 year old female with right shoulder pain refractory.  MRI indicated rotator cuff tear, AC arthrosis, prominent anterior subacromial spur.  Operative intervention was indicated for decompression of the subacromion joint and distal clavicle resection and repair of the rotator cuff.  Risks and benefits discussed including bleeding, infection, damage to neurovascular structures, recurrent tear, adhesive capsulitis, need for repeat operation, etc.  TECHNIQUE:  The patient is in supine beach chair position.  After the induction of adequate anesthesia, 1 gram of Kefzol IV.  The patient placed in the beach chair position.  All bony prominences well padded.  Right shoulder, precordial region, and upper extremity was prepped and draped in the usual sterile fashion.  The acromion, AC joint, and clavicle were delineated with a surgical marker.  Incision was made over the anterior aspect of the acromion in Langers lines.  Subcutaneous tissue was dissected and electrocautery utilized to achieve hemostasis.  Marcaine 0.25% with epinephrine infiltrated in the subcutaneous tissue.  The raphe between the anterior and lateral heads of the deltoid was identified.  The capsule over the [facility] joint was identified. There was significant prominence and spurring of the distal clavicle noted. An incision was made  over the capsule of the distal clavicle.  This line of the clavicle was then skeletonized.  The [facility] joint was entered.  Synovial fluid was evacuated.  No evidence of infection.  There was severe osteoporosis noted.  The [facility] joint was debrided.  With the distal end of the clavicle skeletonized, approximately 1 cm of the distal clavicle was removed with an oscillating saw with a Bennett retractor protecting the cuff at all times. This decompressed the [facility] joint as well as inferior clavicular spur which was removed that was projecting into the rotator cuff.  Next, CA ligament was detached.  The raphe between the anterolateral heads of the deltoid was identified, and the subperiosteum elevated from the acromion.  There was a large spur from the acromion projected anteriorly, inferiorly, and laterally. This was excised with a Theatre manager.  It was noted that significant lateral sloping acromion impinging upon the rotator cuff.  There were two.  The bursa was then excised.  Inspection revealed two separate tears in the rotator cuff, one in the typical anterolateral aspect of the cuff and supraspinatus and the second in the supraspinatus infraspinatus interval more laterally.  This was from the lateral and sloping aspect of the acromion.  The minimal amount of the acromion was removed to decompress this area of impingement anterolaterally and posterolaterally.  The patient had a thin acromion. Minimal acromion was removed, less than 1 mm.  There was no fracture acromion though.  The wound was copiously irrigated.  There was adequate decompression following this acromioplasty.  Next, after the bursa was excised, the cuff was  digitally mobilized.  The two tears measured approximately 1.5 x .5 cm.  Each was freshened with a #15 blade to good bleeding tissue.  The bone beneath it was decorticated with the [facility] rongeur.  The wound was copiously irrigated, and both tears were closed with #1 Vicryl  interrupted figure-of-eight sutures, three sutures in each tear.  There was excellent coverage and repair without significant tension noted.  There was, in addition, a spur off the anteromedial aspect of the acromion which was removed with the [facility] rongeur. Again, no residual impingement was noted with good range of motion of the shoulder.  The wound copiously irrigated again.  The capsule and trapezial fascia over the distal AC joint was repaired with #1 Vicryl interrupted figure-of-eight sutures.  The raphe was repaired with #1 Vicryl interrupted figure-of-eight sutures, and the deltoid repaired to the acromion with #1 Vicryl interrupted figure-of-eight sutures.  Excellent repair.  No tension on the wound site.  The patient had a fairly thin anterior and lateral deltoid, presumably secondary to nonuse.  Next, the wound was copiously irrigated, and the subcutaneous tissue was reapproximated with 2-0 Vicryl simple sutures. The skin was reapproximated with 4-0 subcuticular Prolene.  The wound was dressed sterilely, the patient placed in abduction pillow, extubated without difficulty, and transported to the recovery room in satisfactory condition.  The patient tolerated the procedure well with no complications. DD:  10/09/00 TD:  10/09/00 Job: [id number]

## 2010-09-20 NOTE — Discharge Summary (Signed)
NAMEJOORY, GOUGH           ACCOUNT NO.:  [id number]   MEDICAL RECORD NO.:  [id number]           PATIENT TYPE:  INP   LOCATION:                               FACILITY:  [facility]   PHYSICIAN:  Madlyn Frankel. Charlann Boxer, M.D.  DATE OF BIRTH:  [birth year]   DATE OF ADMISSION:  DATE OF DISCHARGE:  06/06/2008                               DISCHARGE SUMMARY   ADDENDUM TO DISCHARGE SUMMARY:   HOSPITAL COURSE:  The patient remained in hospital due to transfer  issues.  No significant changes in health status while remaining in  hospital.  When seen on February 31, she was stable.  Still with limited  progress with need for further rehab and skilled nursing facility.   DISCHARGE DIAGNOSES:  Unchanged with:  1. Primary osteoporosis.  2. Cancer.  3. Acute blood loss anemia transfused, resolved prior to discharge.     ______________________________  Yetta Glassman. [address], [place]      Madlyn Frankel. Charlann Boxer, M.D.  Electronically Signed    BLM/MEDQ  D:  07/03/2008  T:  07/03/2008  Job:  [id number]

## 2010-09-20 NOTE — H&P (Signed)
[place]. [facility]  Patient:    [name], [name]                  MRN: [id number] Adm. Date:  [id number] Attending:  Nelma Rothman Iii                         History and Physical  CHIEF COMPLAINT:  "I have a large right kidney tumor."  HISTORY OF PRESENT ILLNESS:  [name] is a very nice 77 year old white female who basically developed right flank pain and hematuria.  She subsequently underwent a CT scan which revealed a large right renal tumor with obstruction.  She subsequently underwent cystoscopy and placement of a stent in [place], [place] by Dr. Juliette Mangle and was found again to have a very large mass in the right kidney.  She came for a second opinion.  An MRI was subsequently obtained.  There was some question of involvement of the vena cava and possibly in the pelvis.  However, the MRI revealed that there appeared to be no involvement of the pelvis.  There was a very large tumor in the renal vein but not into the inferior vena cava.  After understanding the risks, benefits and alternatives, she elected to undergo radical nephrectomy.  ALLERGIES:   She has no known allergies.  MEDICATIONS:  Zoloft one q.d.  PAST MEDICAL HISTORY:  Illnesses are essentially none.  PAST SURGICAL HISTORY:  None previously.  SOCIAL HISTORY:  Negative smoker. Negative drinker.  FAMILY HISTORY:  Heart problems, Parkinsons disease and pneumonia but otherwise negative.  There is some prostate cancer and kidney disease, and kidney stones in the family.  REVIEW OF SYSTEMS:  She had some chills, weight loss, some weakness, lethargy, depression, abdominal pain, diarrhea, occasional cough and occasional hesitancy and frequency of urination and passing of blood clots as noted.  PHYSICAL EXAMINATION:  VITAL SIGNS:  Blood pressure 112/70, pulse 84, temperature 97, respirations 16.  GENERAL:  She is well-developed, well-nourished, thin in no acute  distress. Oriented times three.  HEENT:  PERRLA.  Nose and throat clear.  NECK:  Without masses or thyromegaly.  CHEST:  Clear anterior to posterior without rales or rhonchi.  ABDOMEN:  Soft and nontender without organomegaly.  A palpable right upper quadrant mass is present. She is quite thin.  EXTREMITIES: Normal.  NEUROLOGIC:  Intact.  GENITALIA:  Female genitalia is normal.  IMPRESSION:  Large right renal tumor.  PLAN:  Right radial nephrectomy, possibly thoracoabdominally. DD:  12/09/99 TD:  12/09/99 Job: [id number]

## 2010-09-20 NOTE — Discharge Summary (Signed)
[name], [name]           ACCOUNT NO.:  [id number]   MEDICAL RECORD NO.:  [id number]          PATIENT TYPE:  INP   LOCATION:  3305                         FACILITY:  [facility]   PHYSICIAN:  Ines Bloomer, M.D. DATE OF BIRTH:  1933-03-24   DATE OF ADMISSION:  11/18/2005  DATE OF DISCHARGE:  11/22/2005                                 DISCHARGE SUMMARY   DICTATED BY:  Rowe Clack, P.A.-C.   HISTORY OF PRESENT ILLNESS:  The patient is a 77 year old female with a  history of a renal cell adenocarcinoma clear cell type, status post right  radical nephrectomy in 2001 by Dr. Earlene Plater.  At that time, she was found to  have some nodules in the lung, particularly in the right lower lobe and has  been followed with serial CT scans since that time.  Recent studies showed a  new right middle lobe nodule, hilar adenopathy, and a left lower lobe nodule  that has also enlarged.  This left lower lobe nodule was first seen in  January 2007.  Subsequent to that, a PET scan was done which showed the left  lower lesion to be 1.8 x 1.2 cm which has increased in size.  There is also  reportedly some increase in metabolic activity, but these studies not  currently available to the chart.  It was Dr. Scheryl Darter opinion that the  lesion should be resected for the possibility that this is metastatic  disease.  She was admitted this hospitalization for the procedure.  Of note,  the patient did have a bronchoscopy and mediastinoscopy done in June of  2007, which revealed no malignant cells and the mediastinal lymph nodes were  negative.   PAST MEDICAL HISTORY:  History of renal cell carcinoma status post  nephrectomy as described above.   MEDICATIONS PRIOR TO ADMISSION:  1.  Cymbalta 60 mg daily.  2.  Calcium carbonate 500 mg daily.  3.  Multivitamin one daily.  4.  Deplin 7.5 mg daily.   PAST SURGICAL HISTORY:  History of right shoulder surgery.   Family history, social history, review of systems,  and physical exam, please  see the history and physical done at the time of admission.   HOSPITAL COURSE:  Patient was admitted electively on November 18, 2005.  Taken  to the operating room at which time she underwent the following procedures:  1.  Left video assisted thoracoscopic surgery with mini-thoracotomy and      wedge resection of left lower lobe lesion.  2.  Basilar left lower-lobe segmentectomy.   Patient tolerated procedure well. Was taken to the post anesthesia care  unite in stable condition.   POSTOPERATIVE HOSPITAL COURSE:  Patient has done well.  All routine lines,  monitors, and draining devices have been discontinued in a standard fashion.  She did have a postoperative anemia requiring transfusion on November 18, 2005,  for hematocrit of 25.  Her incision is healing well, without evidence of  infection.  She is tolerating a gradual increase in activity, commenced to a  level of postoperative convalescence using routine protocols.  The patient  has  been seen in oncology consultation by Dr. Clelia Croft, and his current  recommendation is surveillance with no adjuvant therapy recommendations at  this time.  She will be followed up at the [facility] in  approximately one month for further evaluation of the situation.  The  pathology does reveal metastatic carcinoma consistent with renal cell  metastatic disease.   Overall, the patient's status is felt to be stable for discharge in the  morning, November 22, 2005, pending morning round reevaluations.   INSTRUCTIONS:  The patient will receive written instructions in regard to  medications, activity, diet, wound care, and follow up.  Medications will be  as preop including:  Cymbalta, calcium, Deplin, and multivitamins.  For  pain:  Tylox one every six hours as needed.  Wound care:  The patient is  instructed to clean her incisions gently with soap and water.  She may  shower.  She is instructed not to drive for two weeks.  No  heavy lifting for  two weeks.  Follow up will be Dr. Edwyna Shell on Wednesday, December 03, 2005, at  the [facility] office with a chest x-ray from [facility].   FINAL DIAGNOSIS:  Metastatic renal cell carcinoma as described.   Other diagnoses include:  1.  Postoperative anemia.  2.  History of previous right shoulder surgery.           ______________________________  Ines Bloomer, M.D.     DPB/MEDQ  D:  11/21/2005  T:  11/21/2005  Job:  [id number]   cc:   Windy Fast L. Earlene Plater, M.D.  Fax: [phone]   Blenda Nicely. [employer]

## 2010-09-20 NOTE — Op Note (Signed)
[name], [name]           ACCOUNT NO.:  [id number]   MEDICAL RECORD NO.:  [id number]          PATIENT TYPE:  INP   LOCATION:  2550                         FACILITY:  [facility]   PHYSICIAN:  Ines Bloomer, M.D. DATE OF BIRTH:  04-17-1933   DATE OF PROCEDURE:  DATE OF DISCHARGE:                                 OPERATIVE REPORT   PREOPERATIVE DIAGNOSIS:  Enlarging left lower lobe lesions.   POSTOPERATIVE DIAGNOSIS:  Probable metastatic renal cell cancer.   OPERATION PERFORMED:  1.  Left video-assisted thoracic surgery, mini thoracotomy, and wedge      resection of left lower lobe lesion.  2.  Basilar left lower lobe segmentectomy.   SURGEON:  D.P. Edwyna Shell, M.D.   FIRST ASSISTANT:  Zadie Rhine, PA-C   After percutaneous insertion of all monitoring lines, the patient was turned  in the left lateral thoracotomy position and was prepped and draped in the  usual sterile manner.  Two trocar sites were made in the anterior and  posterior axillary line to the 7th intercostal space.  Two trocars were  inserted.  No lesions could be seen on the lung, so a small posterolateral  thoracotomy was made over the fifth intercostal space.  The latissimus was  partially divided.  This radius was reflected medially, and the fifth  intercostal space was entered.  A small __________ was placed in the space,  and we palpated and first found a small 7-mm lesion in the basilar segment  of the left lower lobe, and we wedged it out and sent it for frozen section.  It appeared to be cancer.  The other lesion was also in the basilar segment,  and we tried to save the superior segment of the left lower lobe, so  dissection was started.  The inferior pulmonary ligament was taken down and  the pulmonary vein was dissected out.  Then resection started in the  fissure, first dividing the superior portion of the fissure with  electrocautery and then dividing the inferior portion of the fissure with  staplers.  This exposed the basilar branches to the left lower lobe.  These  were dissected out, looped, and then stapled with the other Autosuture 30  stapler.  Right underneath this, we exposed the bronchus to the basilar  segments of the left lower lobe.  This was dissected out, dissecting the  vein off the bronchus and stapling it with the Autosuture green stapler.  Finally, the inferior pulmonary vein branches to the basilar segments were  dissected out, stapled, and divided with the Autosuture 30 white  Roticulator, and then the segment was divided with the EZ45 stapler and the  Autosuture 60 green stapler.  Frozen section of the second lesion was also  metastatic cancer.  Two chest tubes were brought in through the trocar  sites, a right-angled chest tube posteriorly and a straight chest tube  anteriorly.  Marcaine block was done in the usual fashion.  The On-Q  catheter was placed in the usual fashion.  The chest was closed with 2  pericostals with drilling through the 6th intercostal rib and putting the  intercostals around  the superior rib, and then the muscle area was closed with #1-Vicryl and 2-0  Vicryl in the subcutaneous tissue and 3-0 Vicryl as a subcuticular stitch,  Dermabond for the skin.  The patient tolerated the procedure well and was  returned to the recovery room in stable condition.           ______________________________  Ines Bloomer, M.D.     DPB/MEDQ  D:  11/18/2005  T:  11/18/2005  Job:  [id number]   cc:   Ines Bloomer, M.D.  [address]  [place]  [place] [address]   Lucrezia Starch. Earlene Plater, M.D.  Fax: [phone]

## 2010-10-15 ENCOUNTER — Ambulatory Visit
Admission: RE | Admit: 2010-10-15 | Discharge: 2010-10-15 | Disposition: A | Discharge status: Home / Self Care | Payer: Medicare Other | Source: Ambulatory Visit | Attending: Diagnostic Radiology | Admitting: Diagnostic Radiology

## 2010-10-15 ENCOUNTER — Other Ambulatory Visit: Payer: Medicare Other

## 2010-10-15 DIAGNOSIS — N2889 Other specified disorders of kidney and ureter: Secondary | ICD-10-CM

## 2010-10-16 ENCOUNTER — Telehealth: Payer: Self-pay | Admitting: Gastroenterology

## 2010-10-17 NOTE — Telephone Encounter (Signed)
Notified Melissa in scheduling I have sent info for possible EUS to Dr Christella Hartigan; we will call her back.

## 2010-10-22 ENCOUNTER — Encounter: Payer: Self-pay | Admitting: Gastroenterology

## 2010-11-12 ENCOUNTER — Other Ambulatory Visit: Payer: Self-pay | Admitting: Oncology

## 2010-11-12 DIAGNOSIS — N2889 Other specified disorders of kidney and ureter: Secondary | ICD-10-CM

## 2010-11-16 ENCOUNTER — Ambulatory Visit
Admission: RE | Admit: 2010-11-16 | Discharge: 2010-11-16 | Disposition: A | Discharge status: Home / Self Care | Payer: Medicare Other | Source: Ambulatory Visit | Attending: Oncology | Admitting: Oncology

## 2010-11-16 DIAGNOSIS — N2889 Other specified disorders of kidney and ureter: Secondary | ICD-10-CM

## 2010-11-16 MED ORDER — GADOBENATE DIMEGLUMINE 529 MG/ML IV SOLN: 5.0000 mL | Freq: Once | INTRAVENOUS | Status: AC | PRN

## 2010-12-04 ENCOUNTER — Ambulatory Visit (INDEPENDENT_AMBULATORY_CARE_PROVIDER_SITE_OTHER): Payer: Medicare Other | Admitting: Gastroenterology

## 2010-12-04 ENCOUNTER — Encounter: Payer: Self-pay | Admitting: Gastroenterology

## 2010-12-04 VITALS — BP 124/60 | HR 80 | Ht 64.0 in | Wt 110.0 lb

## 2010-12-04 DIAGNOSIS — R933 Abnormal findings on diagnostic imaging of other parts of digestive tract: Secondary | ICD-10-CM

## 2010-12-04 NOTE — Progress Notes (Signed)
HPI: This is a  very pleasant 77 year old woman who is here with her older sister today. She had 13 siblings, 11 of whom are still alive today. All of them lived well into their 39s.   10 years ago right nephrectomy for rcc.  Spread to lung 5 years, treated with lung resection.  2011 found to have left renal involvement.  Has undergone cryoablation procedure time 2.  Imaging around this showed pancreatic lesion.  I reviewed the images and this is a small, 1-2 cm hypervascular lesion in the body of pancreas.  Has never been told she had pancreatic disease, never a etoh.     Review of systems: Pertinent positive and negative review of systems were noted in the above HPI section.  All other review of systems was otherwise negative.   Past Medical History  Diagnosis Date  . Depression   . Nausea   . Renal cell carcinoma     right   . Metastatic lung cancer   . Anemia   . History of colon polyps     Past Surgical History  Procedure Date  . Nephrectomy     right  . Rotator cuff repair     right  . Foot surgery   . Lung lobectomy   . Total hip arthroplasty      reports that she has never smoked. She has never used smokeless tobacco. She reports that she does not drink alcohol or use illicit drugs.  family history includes Breast cancer in her sister; Colon cancer in her sister; Colon polyps in her sister; Heart attack in her brother; and Heart disease in her brother.    Current Medications, Allergies were all reviewed with the patient via [facility] electronic medical record system.    Physical Exam: BP 124/60  Pulse 80  Ht 5\' 4"  (1.626 m)  Wt 110 lb (49.896 kg)  BMI 18.88 kg/m2 Constitutional: generally well-appearing Psychiatric: alert and oriented x3 Eyes: extraocular movements intact Mouth: oral pharynx moist, no lesions Neck: supple no lymphadenopathy Cardiovascular: heart regular rate and rhythm Lungs: clear to auscultation bilaterally Abdomen: soft,  nontender, nondistended, no obvious ascites, no peritoneal signs, normal bowel sounds Extremities: no lower extremity edema bilaterally Skin: no lesions on visible extremities    Assessment and plan: 77 y.o. female with pancreatic body lesion in the setting of recurrent, metastatic renal cell carcinoma  We will proceed with endoscopic ultrasound, fine needle aspiration at her soonest convenience. Renal cell cancer can metastasize really anywhere in the body and so that is a possibility. Islet cell tumor is another possibility. Imaging characteristics are not really consistent with pancreatic adenocarcinoma.

## 2010-12-04 NOTE — Patient Instructions (Signed)
Upper EUS, radial +/- linear for pancreatic body lesion at [facility]. A copy of this information will be made available to Drs .Dorna Leitz, Keturah Barre at [place], [place].

## 2010-12-12 ENCOUNTER — Ambulatory Visit (HOSPITAL_COMMUNITY)
Admission: RE | Admit: 2010-12-12 | Discharge: 2010-12-12 | Disposition: A | Discharge status: Home / Self Care | Payer: Medicare Other | Source: Ambulatory Visit | Attending: Gastroenterology | Admitting: Gastroenterology

## 2010-12-12 ENCOUNTER — Other Ambulatory Visit: Payer: Self-pay | Admitting: Gastroenterology

## 2010-12-12 ENCOUNTER — Encounter: Payer: Medicare Other | Admitting: Gastroenterology

## 2010-12-12 DIAGNOSIS — Z85118 Personal history of other malignant neoplasm of bronchus and lung: Secondary | ICD-10-CM | POA: Insufficient documentation

## 2010-12-12 DIAGNOSIS — Z860100 Personal history of colon polyps, unspecified: Secondary | ICD-10-CM | POA: Insufficient documentation

## 2010-12-12 DIAGNOSIS — Z79899 Other long term (current) drug therapy: Secondary | ICD-10-CM | POA: Insufficient documentation

## 2010-12-12 DIAGNOSIS — K869 Disease of pancreas, unspecified: Secondary | ICD-10-CM | POA: Insufficient documentation

## 2010-12-12 DIAGNOSIS — D649 Anemia, unspecified: Secondary | ICD-10-CM | POA: Insufficient documentation

## 2010-12-12 DIAGNOSIS — Z85528 Personal history of other malignant neoplasm of kidney: Secondary | ICD-10-CM | POA: Insufficient documentation

## 2010-12-12 DIAGNOSIS — R933 Abnormal findings on diagnostic imaging of other parts of digestive tract: Secondary | ICD-10-CM

## 2011-01-07 ENCOUNTER — Ambulatory Visit (INDEPENDENT_AMBULATORY_CARE_PROVIDER_SITE_OTHER): Payer: Medicare Other | Admitting: Gastroenterology

## 2011-01-07 ENCOUNTER — Encounter: Payer: Self-pay | Admitting: Gastroenterology

## 2011-01-07 DIAGNOSIS — C649 Malignant neoplasm of unspecified kidney, except renal pelvis: Secondary | ICD-10-CM | POA: Insufficient documentation

## 2011-01-07 DIAGNOSIS — R933 Abnormal findings on diagnostic imaging of other parts of digestive tract: Secondary | ICD-10-CM

## 2011-01-07 NOTE — Patient Instructions (Addendum)
You will be set up for an upper endoscopic ultrasound at [facility] for pancreatic mass (repeat EUS FNA), with propofol sedation at [facility]. A copy of this information will be made available to Dr Richarda Overlie, Dr. Eli Hose.  Your Endoscopic Ultrasound is scheduled on 02/20/2011 at 8am. You will arrive at [facility] at 6am Your Pre admit appointment is scheduled on 02/18/2011 at 12pm. You will go straight to admitting

## 2011-01-07 NOTE — Progress Notes (Signed)
Review of pertinent gastrointestinal problems: 1. 1.5 cm solid lesion in body of pancreas in the setting of known metastatic renal cell carcinoma. Fine needle aspiration August 2012 done by endoscopic ultrasound showed rare atypical cells, otherwise nondiagnostic.  HPI: This is a very pleasant 77 year old woman whom I last saw 3-4 weeks ago at the time of upper endoscopic ultrasound. My fine needle aspiration showed rare atypical cells but was not diagnostic overall. She had a death in her family and we set her up for this appointment to discuss repeat biopsy attempt.  She is here with one of her sisters today.    Past Medical History:   Depression                                                   Nausea                                                       Renal cell carcinoma                                           Comment:right    Metastatic lung cancer                                       Anemia                                                       History of colon polyps                                     Past Surgical History:   NEPHRECTOMY                                                    Comment:right   ROTATOR CUFF REPAIR                                            Comment:right   FOOT SURGERY                                                 LUNG LOBECTOMY  TOTAL HIP ARTHROPLASTY                                       reports that she has never smoked. She has never used smokeless tobacco. She reports that she does not drink alcohol or use illicit drugs.  family history includes Breast cancer in her sister; Colon cancer in her sister; Colon polyps in her sister; Heart attack in her brother; and Heart disease in her brother.    Current medicines and allergies were reviewed in [facility]    Physical Exam: BP 108/72  Pulse 58  Ht 5\' 4"  (1.626 m)  Wt 110 lb (49.896 kg)  BMI 18.88 kg/m2 Constitutional: generally  well-appearing Psychiatric: alert and oriented x3 Abdomen: soft, nontender, nondistended, no obvious ascites, no peritoneal signs, normal bowel sounds     Assessment and plan: 77 y.o. female with solid mass in pancreas, known recurrent renal cell cancer  I think repeat endoscopic ultrasound with fine-needle aspiration is a good next step. I suspect she either has an islet cell tumor or perhaps metastatic renal cell carcinoma. Clearly treatment for those 2 are very different.

## 2011-02-04 LAB — GLUCOSE, CAPILLARY: Glucose-Capillary: 78

## 2011-02-18 ENCOUNTER — Ambulatory Visit (HOSPITAL_COMMUNITY)
Admission: RE | Admit: 2011-02-18 | Discharge: 2011-02-18 | Disposition: A | Discharge status: Home / Self Care | Payer: Medicare Other | Source: Ambulatory Visit | Attending: Gastroenterology | Admitting: Gastroenterology

## 2011-02-18 LAB — HEMOGLOBIN AND HEMATOCRIT, BLOOD
HCT: 35.8 % — ABNORMAL LOW (ref 36.0–46.0)
Hemoglobin: 11.8 g/dL — ABNORMAL LOW (ref 12.0–15.0)

## 2011-02-18 LAB — BASIC METABOLIC PANEL
BUN: 29 mg/dL — ABNORMAL HIGH (ref 6–23)
Creatinine, Ser: 1.23 mg/dL — ABNORMAL HIGH (ref 0.50–1.10)
GFR calc Af Amer: 48 mL/min — ABNORMAL LOW (ref >90)
GFR calc non Af Amer: 42 mL/min — ABNORMAL LOW (ref >90)

## 2011-02-20 ENCOUNTER — Ambulatory Visit (HOSPITAL_COMMUNITY)
Admission: RE | Admit: 2011-02-20 | Discharge: 2011-02-20 | Disposition: A | Discharge status: Still In Hospital | Payer: Medicare Other | Source: Ambulatory Visit | Attending: Gastroenterology | Admitting: Gastroenterology

## 2011-02-20 ENCOUNTER — Telehealth: Payer: Self-pay

## 2011-02-20 ENCOUNTER — Encounter: Payer: Medicare Other | Admitting: Gastroenterology

## 2011-02-20 DIAGNOSIS — K8689 Other specified diseases of pancreas: Secondary | ICD-10-CM

## 2011-02-20 DIAGNOSIS — K869 Disease of pancreas, unspecified: Secondary | ICD-10-CM | POA: Insufficient documentation
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
## 2011-02-21 ENCOUNTER — Telehealth: Payer: Self-pay | Admitting: Gastroenterology

## 2011-02-21 NOTE — Telephone Encounter (Signed)
Advised pt that as soon as I get her scheduled I will call, I have to speak with Dr Christella Hartigan on Monday when he returns pt daughter agreed

## 2011-02-21 NOTE — Telephone Encounter (Signed)
No Nov appt available will call and schedule for a dec appt

## 2011-02-26 ENCOUNTER — Other Ambulatory Visit: Payer: Self-pay | Admitting: Gastroenterology

## 2011-02-26 ENCOUNTER — Telehealth: Payer: Self-pay | Admitting: Gastroenterology

## 2011-02-26 NOTE — Telephone Encounter (Signed)
Spoke with sister see alternate phone note

## 2011-02-26 NOTE — Telephone Encounter (Signed)
Pt aware of the appt instructed and meds reviewed pt was also given her pre op appt and was mailed copies

## 2011-04-07 ENCOUNTER — Encounter (HOSPITAL_COMMUNITY): Payer: Self-pay

## 2011-04-07 ENCOUNTER — Ambulatory Visit (HOSPITAL_COMMUNITY)
Admission: RE | Admit: 2011-04-07 | Discharge: 2011-04-07 | Disposition: A | Discharge status: Home / Self Care | Payer: Medicare Other | Source: Ambulatory Visit | Attending: Gastroenterology | Admitting: Gastroenterology

## 2011-04-07 NOTE — Anesthesia Preprocedure Evaluation (Addendum)
Anesthesia Evaluation  Patient identified by MRN, date of birth, ID band Patient awake    Reviewed: Allergy & Precautions, H&P , NPO status , Patient's Chart, lab work & pertinent test results  Airway Mallampati: II TM Distance: >3 FB Neck ROM: Full    Dental No notable dental hx.    Pulmonary neg pulmonary ROS, shortness of breath (mild s/p lobecotomy for cancer),  clear to auscultation  Pulmonary exam normal       Cardiovascular neg cardio ROS Regular Normal    Neuro/Psych Negative Neurological ROS  Negative Psych ROS   GI/Hepatic negative GI ROS, Neg liver ROS,   Endo/Other  Negative Endocrine ROS  Renal/GU negative Renal ROS  Genitourinary negative   Musculoskeletal negative musculoskeletal ROS (+)   Abdominal   Peds negative pediatric ROS (+)  Hematology negative hematology ROS (+)   Anesthesia Other Findings   Reproductive/Obstetrics negative OB ROS                           Anesthesia Physical Anesthesia Plan  ASA: II  Anesthesia Plan: MAC   Post-op Pain Management:    Induction:   Airway Management Planned:   Additional Equipment:   Intra-op Plan:   Post-operative Plan:   Informed Consent: I have reviewed the patients History and Physical, chart, labs and discussed the procedure including the risks, benefits and alternatives for the proposed anesthesia with the patient or authorized representative who has indicated his/her understanding and acceptance.   Dental advisory given  Plan Discussed with:   Anesthesia Plan Comments:         Anesthesia Quick Evaluation

## 2011-04-10 ENCOUNTER — Other Ambulatory Visit: Payer: Self-pay | Admitting: Gastroenterology

## 2011-04-10 ENCOUNTER — Encounter (HOSPITAL_COMMUNITY): Payer: Self-pay | Admitting: *Deleted

## 2011-04-10 ENCOUNTER — Encounter (HOSPITAL_COMMUNITY)
Admission: RE | Disposition: A | Discharge status: Home / Self Care | Payer: Self-pay | Source: Ambulatory Visit | Attending: Gastroenterology

## 2011-04-10 ENCOUNTER — Encounter (HOSPITAL_COMMUNITY): Payer: Self-pay | Admitting: Anesthesiology

## 2011-04-10 ENCOUNTER — Ambulatory Visit (HOSPITAL_COMMUNITY)
Admission: RE | Admit: 2011-04-10 | Discharge: 2011-04-10 | Disposition: A | Discharge status: Home / Self Care | Payer: Medicare Other | Source: Ambulatory Visit | Attending: Gastroenterology | Admitting: Gastroenterology

## 2011-04-10 ENCOUNTER — Encounter (HOSPITAL_COMMUNITY): Payer: Self-pay | Admitting: Gastroenterology

## 2011-04-10 ENCOUNTER — Ambulatory Visit (HOSPITAL_COMMUNITY): Payer: Medicare Other | Admitting: Anesthesiology

## 2011-04-10 DIAGNOSIS — K863 Pseudocyst of pancreas: Secondary | ICD-10-CM

## 2011-04-10 DIAGNOSIS — C649 Malignant neoplasm of unspecified kidney, except renal pelvis: Secondary | ICD-10-CM | POA: Insufficient documentation

## 2011-04-10 DIAGNOSIS — Z85118 Personal history of other malignant neoplasm of bronchus and lung: Secondary | ICD-10-CM | POA: Insufficient documentation

## 2011-04-10 DIAGNOSIS — R933 Abnormal findings on diagnostic imaging of other parts of digestive tract: Secondary | ICD-10-CM

## 2011-04-10 DIAGNOSIS — C7889 Secondary malignant neoplasm of other digestive organs: Secondary | ICD-10-CM | POA: Insufficient documentation

## 2011-04-10 DIAGNOSIS — K862 Cyst of pancreas: Secondary | ICD-10-CM

## 2011-04-10 HISTORY — PX: EUS: SHX5427

## 2011-04-10 SURGERY — UPPER ENDOSCOPIC ULTRASOUND (EUS) LINEAR
Anesthesia: Monitor Anesthesia Care

## 2011-04-10 MED ORDER — MIDAZOLAM HCL 5 MG/5ML IJ SOLN
INTRAMUSCULAR | Status: DC | PRN
  Administered 2011-04-10: 2 mg via INTRAVENOUS

## 2011-04-10 MED ORDER — PROPOFOL 10 MG/ML IV EMUL
INTRAVENOUS | Status: DC | PRN
  Administered 2011-04-10: 75 ug/kg/min via INTRAVENOUS

## 2011-04-10 MED ORDER — PROPOFOL 10 MG/ML IV BOLUS
INTRAVENOUS | Status: DC | PRN
  Administered 2011-04-10: 50 mg via INTRAVENOUS

## 2011-04-10 MED ORDER — BUTAMBEN-TETRACAINE-BENZOCAINE 2-2-14 % EX AERO
INHALATION_SPRAY | CUTANEOUS | Status: DC | PRN
  Administered 2011-04-10 (×2): 1 via TOPICAL

## 2011-04-10 MED ORDER — LACTATED RINGERS IV SOLN
INTRAVENOUS | Status: DC
  Administered 2011-04-10: 08:00:00 via INTRAVENOUS

## 2011-04-10 MED ORDER — FENTANYL CITRATE 0.05 MG/ML IJ SOLN
INTRAMUSCULAR | Status: DC | PRN
  Administered 2011-04-10: 50 ug via INTRAVENOUS

## 2011-04-10 NOTE — Op Note (Signed)
[facility] [address] [place], [place]  [address]  ENDOSCOPIC ULTRASOUND PROCEDURE REPORT  PATIENT:  [name], [name]  MR#:  [id number] BIRTHDATE:  05-26-1932  GENDER:  female ENDOSCOPIST:  Rachael Fee, MD PROCEDURE DATE:  04/10/2011 PROCEDURE:  Upper EUS w/FNA ASA CLASS:  Class II INDICATIONS:  1.5 cm solid lesion in body of pancreas in the setting of known metastatic renal cell carcinoma. Fine needle aspiration August 2012 done by endoscopic ultrasound showed rare atypical cells, otherwise nondiagnostic. MEDICATIONS:  MAC sedation, administered by CRNA DESCRIPTION OF PROCEDURE:   After the risks benefits and alternatives of the procedure were  explained, informed consent was obtained. The patient was then placed in the left, lateral, decubitus postion and IV sedation was administered. Throughout the procedure, the patient's blood pressure, pulse and oxygen saturations were monitored continuously.  Under direct visualization, the  endoscope was introduced through the mouth and advanced to the .  Water was used as necessary to provide an acoustic interface.  Upon completion of the imaging, water was removed and the patient was sent to the recovery room in satisfactory condition. <<PROCEDUREIMAGES>> Endoscopic findings: 1. Normal UGI tract (limited exam with radial and linear echoendoscopes)  EUS findings: 1. Unchanged round, well demarcated 1.5cm hypoechoic, vascular mass in neck of pancreas. This does not involve any surrounding vessels (is completely contained within pancreatic parenchyma). The mass was sampled with 4 passes of a 22 gauge BS EUS FNA needle using color doppler. 2. Otherwise the pancreatic parenchyma was normal. 3. Normal main pancreatic duct 4. CBD was normal, non-dilated 5. Gallbladder was normal. 6. No peripancreatic adenopathy. 7. Limited views of liver, spleen, portal vessels were all normal  Impression: 1.5cm mass in neck of  pancreas. This was sampled with FNA, preliminary cytology review is very suggestive of a metastatic renal cell site.  Await final pathology.  ______________________________ Rachael Fee, MD  cc: Annett Gula, MD  n. Rosalie Doctor:   Rachael Fee at 04/10/2011 09:50 AM  Orrin Brigham, [phone]

## 2011-04-10 NOTE — Transfer of Care (Signed)
Immediate Anesthesia Transfer of Care Note  Patient: [name]  Procedure(s) Performed:  UPPER ENDOSCOPIC ULTRASOUND (EUS) LINEAR  Patient Location: PACU and Endoscopy Unit  Anesthesia Type: MAC  Level of Consciousness: awake  Airway & Oxygen Therapy: Patient Spontanous Breathing and Patient connected to nasal cannula oxygen  Post-op Assessment: Report given to PACU RN  Post vital signs: Reviewed and stable  Complications: No apparent anesthesia complications

## 2011-04-10 NOTE — H&P (Signed)
  HPI: This is a  Woman with mass in pancreas, here for repeat FNA    Review of systems: Pertinent positive and negative review of systems were noted in the above HPI section. Complete review of systems was performed and was otherwise normal.    Past Medical History  Diagnosis Date  . Depression   . Nausea   . Renal cell carcinoma     right   . Metastatic lung cancer   . Anemia   . History of colon polyps     Past Surgical History  Procedure Date  . Nephrectomy     right  . Rotator cuff repair     right  . Foot surgery   . Lung lobectomy   . Total hip arthroplasty     Current Facility-Administered Medications  Medication Dose Route Frequency Provider Last Rate Last Dose  . lactated ringers infusion   Intravenous Continuous Rob Bunting, MD 20 mL/hr at 04/10/11 0800      Allergies as of 02/26/2011  . (No Known Allergies)    Family History  Problem Relation Age of Onset  . Breast cancer Sister     x 3   . Colon cancer Sister     and Brother  . Heart disease Brother   . Heart attack Brother     and Sister   . Colon polyps Sister     and Brother    History   Social History  . Marital Status: Widowed    Spouse Name: N/A    Number of Children: 2  . Years of Education: N/A   Occupational History  . Retired    Social History Main Topics  . Smoking status: Never Smoker   . Smokeless tobacco: Never Used  . Alcohol Use: No  . Drug Use: No  . Sexually Active: Yes    Birth Control/ Protection: Post-menopausal   Other Topics Concern  . Not on file   Social History Narrative   One caffeine drink daily        Physical Exam: BP 140/77  Pulse 68  Temp(Src) 98.6 F (37 C) (Oral)  Resp 17  Ht 5\' 4"  (1.626 m)  Wt 48.988 kg (108 lb)  BMI 18.54 kg/m2  SpO2 98% Constitutional: generally well-appearing Cardiovascular: heart regular rate and rhythm Lungs: clear to auscultation bilaterally Abdomen: soft, nontender, nondistended, no obvious  ascites, no peritoneal signs, normal bowel sounds     Assessment and plan: 77 y.o. female with  Mass in pancreas  Plan on eus fna with propofol sedation

## 2011-04-10 NOTE — Anesthesia Postprocedure Evaluation (Signed)
  Anesthesia Post-op Note  Patient: [name]  Procedure(s) Performed:  UPPER ENDOSCOPIC ULTRASOUND (EUS) LINEAR  Patient Location: PACU  Anesthesia Type: MAC  Level of Consciousness: awake and alert   Airway and Oxygen Therapy: Patient Spontanous Breathing  Post-op Pain: mild  Post-op Assessment: Post-op Vital signs reviewed, Patient's Cardiovascular Status Stable, Respiratory Function Stable, Patent Airway and No signs of Nausea or vomiting  Post-op Vital Signs: stable  Complications: No apparent anesthesia complications

## 2011-04-10 NOTE — Preoperative (Signed)
Beta Blockers   Reason not to administer Beta Blockers:Not Applicable 

## 2011-04-11 ENCOUNTER — Encounter (HOSPITAL_COMMUNITY): Payer: Self-pay | Admitting: Gastroenterology

## 2011-04-11 ENCOUNTER — Other Ambulatory Visit: Payer: Self-pay | Admitting: Oncology

## 2011-04-11 ENCOUNTER — Telehealth: Payer: Self-pay | Admitting: Oncology

## 2011-04-11 DIAGNOSIS — C649 Malignant neoplasm of unspecified kidney, except renal pelvis: Secondary | ICD-10-CM

## 2011-04-11 NOTE — Telephone Encounter (Signed)
S/w pt re appt for 12/13 @ 1:30 pm.

## 2011-04-17 ENCOUNTER — Other Ambulatory Visit (HOSPITAL_BASED_OUTPATIENT_CLINIC_OR_DEPARTMENT_OTHER): Payer: Medicare Other

## 2011-04-17 ENCOUNTER — Ambulatory Visit (HOSPITAL_BASED_OUTPATIENT_CLINIC_OR_DEPARTMENT_OTHER): Payer: Medicare Other | Admitting: Oncology

## 2011-04-17 ENCOUNTER — Telehealth: Payer: Self-pay | Admitting: Oncology

## 2011-04-17 VITALS — BP 126/65 | HR 79 | Temp 97.6°F | Ht 64.0 in | Wt 109.7 lb

## 2011-04-17 DIAGNOSIS — C649 Malignant neoplasm of unspecified kidney, except renal pelvis: Secondary | ICD-10-CM

## 2011-04-17 DIAGNOSIS — R634 Abnormal weight loss: Secondary | ICD-10-CM

## 2011-04-17 DIAGNOSIS — K869 Disease of pancreas, unspecified: Secondary | ICD-10-CM

## 2011-04-17 DIAGNOSIS — C7800 Secondary malignant neoplasm of unspecified lung: Secondary | ICD-10-CM

## 2011-04-17 LAB — CBC WITH DIFFERENTIAL/PLATELET
BASO%: 0.9 % (ref 0.0–2.0)
Basophils Absolute: 0 10*3/uL (ref 0.0–0.1)
EOS%: 4.4 % (ref 0.0–7.0)
Eosinophils Absolute: 0.2 10*3/uL (ref 0.0–0.5)
HCT: 34.1 % — ABNORMAL LOW (ref 34.8–46.6)
HGB: 11.3 g/dL — ABNORMAL LOW (ref 11.6–15.9)
LYMPH%: 36.2 % (ref 14.0–49.7)
MCH: 30.1 pg (ref 25.1–34.0)
MCHC: 33.1 g/dL (ref 31.5–36.0)
MCV: 90.9 fL (ref 79.5–101.0)
MONO#: 0.3 10*3/uL (ref 0.1–0.9)
MONO%: 7.5 % (ref 0.0–14.0)
NEUT#: 2.2 10*3/uL (ref 1.5–6.5)
NEUT%: 51 % (ref 38.4–76.8)
Platelets: 208 10*3/uL (ref 145–400)
RBC: 3.75 10*6/uL (ref 3.70–5.45)
RDW: 13.5 % (ref 11.2–14.5)
WBC: 4.3 10*3/uL (ref 3.9–10.3)
lymph#: 1.6 10*3/uL (ref 0.9–3.3)
nRBC: 0 % (ref 0–0)

## 2011-04-17 LAB — COMPREHENSIVE METABOLIC PANEL
Albumin: 4 g/dL (ref 3.5–5.2)
CO2: 26 mEq/L (ref 19–32)
Chloride: 107 mEq/L (ref 96–112)
Glucose, Bld: 90 mg/dL (ref 70–99)
Potassium: 4.2 mEq/L (ref 3.5–5.3)
Sodium: 140 mEq/L (ref 135–145)
Total Protein: 6.3 g/dL (ref 6.0–8.3)

## 2011-04-17 NOTE — Progress Notes (Signed)
Hematology and Oncology Follow Up Visit  JAKEYA GHERARDI [id number] 1932/09/21 77 y.o. 04/17/2011 2:26 PM  CC: Gaynelle Arabian, M.D.  Cameron Proud, M.D.   Principle Diagnosis: This is a 77 year old female with renal cell carcinoma initially diagnosed with stage IIIB in August 2001.  She has developed a lung metastasis with stage IV disease in 2007. And recently found to have a pancreatic lesion.   Prior Therapy: 1.         She had underwent a radical nephrectomy in August 2001.  She had a stage IIIB disease. 2. The patient developed pulmonary metastases in July 2007 status post surgical resection.  She has not had any recurrent disease since that time. 3.         S/P Cryoablation of a left kidney mass done in 09/2010 Done by IR. 4.         S/P recent FNA of a pancreatic lesion by Dr. Christella Hartigan. Pathology indicate metastatic renal cell cancer in 04/2011.   Current therapy: Observation and surveillance.  Interim History: Ms. Guimaraes presents today for a followup visit.  She has continued to do relatively fair without any clear-cut evidence of recurrent disease.  She had not rally report any clear-cut pulmonary symptoms such as chest pain, shortness of breath, difficulty breathing.  She had not reported any hemoptysis.  She had not reported any abdominal pain.  She had not reported any discomfort.  Her energy and performance status, although marginal is unchanged. As mentioned above, she had a pancreatic biopsy which confirmed the presence of renal cell cancer. She report no abdominal pain. No nausea or vomiting.   She has a good quality of life at this point.   Medications: I have reviewed the patient's current medications. Current outpatient prescriptions:buPROPion (WELLBUTRIN SR) 150 MG 12 hr tablet, Take 1 tablet by mouth Twice daily., Disp: , Rfl: ;  Calcium Carbonate-Vitamin D (CALCIUM + D PO), Take by mouth daily.  , Disp: , Rfl: ;  folic acid (FOLVITE) 400 MCG tablet, Take 400  mcg by mouth daily.  , Disp: , Rfl: ;  Multiple Vitamin (MULTIVITAMIN) tablet, Take 1 tablet by mouth daily.  , Disp: , Rfl:  Omega-3 Fatty Acids (FISH OIL) 1200 MG CAPS, Take by mouth daily.  , Disp: , Rfl: ;  omeprazole (PRILOSEC) 20 MG capsule, Take 1 tablet by mouth daily., Disp: , Rfl: ;  PARoxetine (PAXIL) 10 MG tablet, Take 1 tablet by mouth daily., Disp: , Rfl: ;  vitamin B-12 (CYANOCOBALAMIN) 1000 MCG tablet, Take 1,000 mcg by mouth daily.  , Disp: , Rfl:   Allergies: No Known Allergies  Past Medical History, Surgical history, Social history, and Family History were reviewed and updated.  Review of Systems: Constitutional:  Negative for fever, chills, night sweats, anorexia, weight loss, pain. Cardiovascular: no chest pain or dyspnea on exertion Respiratory: no cough, shortness of breath, or wheezing Neurological: no TIA or stroke symptoms Dermatological: negative ENT: negative Skin: Negative. Gastrointestinal: no abdominal pain, change in bowel habits, or black or bloody stools Genito-Urinary: no dysuria, trouble voiding, or hematuria Hematological and Lymphatic: negative Breast: negative for breast lumps Musculoskeletal: negative Remaining ROS negative. Physical Exam: Blood pressure 126/65, pulse 79, temperature 97.6 F (36.4 C), temperature source Oral, height 5\' 4"  (1.626 m), weight 109 lb 11.2 oz (49.76 kg). ECOG: 1 General appearance: Alert, awake and frail appearing women. Head: Normocephalic, without obvious abnormality, atraumatic Neck: no adenopathy, no carotid bruit, no JVD, supple, symmetrical, trachea midline  and thyroid not enlarged, symmetric, no tenderness/mass/nodules Lymph nodes: Cervical, supraclavicular, and axillary nodes normal. Heart:regular rate and rhythm, S1, S2 normal, no murmur, click, rub or gallop Lung:chest clear, no wheezing, rales, normal symmetric air entry Abdomin: soft, non-tender, without masses or organomegaly EXT:no erythema, induration,  or nodules   Lab Results: Lab Results  Component Value Date   WBC 4.3 04/17/2011   HGB 11.3* 04/17/2011   HCT 34.1* 04/17/2011   MCV 90.9 04/17/2011   PLT 208 04/17/2011     Chemistry      Component Value Date/Time   NA 136 02/18/2011 1253   K 4.4 02/18/2011 1253   CL 101 02/18/2011 1253   CO2 26 02/18/2011 1253   BUN 29* 02/18/2011 1253   CREATININE 1.23* 02/18/2011 1253      Component Value Date/Time   CALCIUM 9.7 02/18/2011 1253   ALKPHOS 69 01/15/2010 0916   AST 24 01/15/2010 0916   ALT 11 01/15/2010 0916   BILITOT 0.6 01/15/2010 0916     MRI of the abdomin  On 11/12/2010  IMPRESSION:  1. The foci of recurrent renal cell carcinoma along the original  left renal ablation bed appear to have been successfully included  in the new ablation zone, with no residual arterial phase  enhancement currently identified. Surveillance imaging is  recommended.  2. The small hypervascular pancreatic body mass is subtle on  today's exam due to motion artifact.  3. Trace left pleural effusion with adjacent passive atelectasis.  4. Cholelithiasis.      Impression and Plan:  This is a pleasant 77 year old female with the following issues: 1. Stage IV renal cell carcinoma, clear cell histology resected initially in 2001.  She had a metastasectomy in 2007 for a lung lesion.  She is also S/P recent Cryoablation of a Left renal mass. Now she has documented pancreatic lesion biopsy proven to be renal in etiology. Option of treatment discussed today with Malita, her sister and son. Surgery is not an option at this point given her age and frail status. Systemic therapy might be an option (TKI of VEGEF of MTOR inhibitors) these can be reserved if she develops wide spread disease. Observation might be the best choice at this time. Will arrange for follow up CTs in 3 months. 2. Depression.  She is doing better from that 3. Weight loss.  I have encouraged her to increase nutritional status.  I have  given her suggestion to using Ensure at least three times a day and I will reevaluate her in six months.    [facility], MD 12/13/20122:26 PM

## 2011-04-17 NOTE — Telephone Encounter (Signed)
appts printed for pt for 3/13 and 3/15,aom

## 2011-06-26 ENCOUNTER — Other Ambulatory Visit: Payer: Self-pay | Admitting: Diagnostic Radiology

## 2011-07-16 ENCOUNTER — Ambulatory Visit (HOSPITAL_COMMUNITY)
Admission: RE | Admit: 2011-07-16 | Discharge: 2011-07-16 | Disposition: A | Discharge status: Home / Self Care | Payer: Medicare Other | Source: Ambulatory Visit | Attending: Oncology | Admitting: Oncology

## 2011-07-16 ENCOUNTER — Other Ambulatory Visit (HOSPITAL_BASED_OUTPATIENT_CLINIC_OR_DEPARTMENT_OTHER): Payer: Medicare Other | Admitting: Lab

## 2011-07-16 ENCOUNTER — Ambulatory Visit
Admission: RE | Admit: 2011-07-16 | Discharge: 2011-07-16 | Disposition: A | Discharge status: Home / Self Care | Payer: Medicare Other | Source: Ambulatory Visit | Attending: Diagnostic Radiology | Admitting: Diagnostic Radiology

## 2011-07-16 ENCOUNTER — Encounter (HOSPITAL_COMMUNITY): Payer: Self-pay

## 2011-07-16 ENCOUNTER — Other Ambulatory Visit: Payer: Self-pay | Admitting: Oncology

## 2011-07-16 DIAGNOSIS — K869 Disease of pancreas, unspecified: Secondary | ICD-10-CM | POA: Insufficient documentation

## 2011-07-16 DIAGNOSIS — Z905 Acquired absence of kidney: Secondary | ICD-10-CM | POA: Insufficient documentation

## 2011-07-16 DIAGNOSIS — C649 Malignant neoplasm of unspecified kidney, except renal pelvis: Secondary | ICD-10-CM

## 2011-07-16 DIAGNOSIS — J984 Other disorders of lung: Secondary | ICD-10-CM | POA: Insufficient documentation

## 2011-07-16 DIAGNOSIS — E041 Nontoxic single thyroid nodule: Secondary | ICD-10-CM | POA: Insufficient documentation

## 2011-07-16 HISTORY — DX: Malignant neoplasm of unspecified kidney, except renal pelvis: C64.9

## 2011-07-16 LAB — CBC WITH DIFFERENTIAL/PLATELET
Basophils Absolute: 0 10*3/uL (ref 0.0–0.1)
EOS%: 3.8 % (ref 0.0–7.0)
Eosinophils Absolute: 0.2 10*3/uL (ref 0.0–0.5)
HGB: 11.6 g/dL (ref 11.6–15.9)
LYMPH%: 26 % (ref 14.0–49.7)
MCH: 31.3 pg (ref 25.1–34.0)
MCV: 92.6 fL (ref 79.5–101.0)
MONO%: 9.9 % (ref 0.0–14.0)
NEUT#: 3.1 10*3/uL (ref 1.5–6.5)
Platelets: 222 10*3/uL (ref 145–400)
RBC: 3.71 10*6/uL (ref 3.70–5.45)

## 2011-07-16 LAB — CMP (CANCER CENTER ONLY)
CO2: 28 mEq/L (ref 18–33)
Calcium: 8.8 mg/dL (ref 8.0–10.3)
Chloride: 100 mEq/L (ref 98–108)
Creat: 1.3 mg/dl — ABNORMAL HIGH (ref 0.6–1.2)
Glucose, Bld: 85 mg/dL (ref 73–118)
Sodium: 144 mEq/L (ref 128–145)
Total Bilirubin: 0.7 mg/dl (ref 0.20–1.60)
Total Protein: 6.7 g/dL (ref 6.4–8.1)

## 2011-07-16 MED ORDER — IOHEXOL 300 MG/ML  SOLN
100.0000 mL | Freq: Once | INTRAMUSCULAR | Status: AC | PRN
  Administered 2011-07-16: 100 mL via INTRAVENOUS

## 2011-07-16 NOTE — Progress Notes (Signed)
Patient here with two sisters in follow-up cryoablation left renal cell carcinoma x2.  Denies anorexia/nausea/weight loss.  Eating well.  Complains of RUQ abdominal pain radiating to lower right ribs.  Pain exacerbated with sneezing, coughing, lying on right side and turning to right.  Ibuprofen "might help."  Has hydrocodone for bilateral wrist fractures sustained "a bit more than three months ago" but isn't using it for this current right-sided pain.  Patient reports she fell in the bathtub "a long time ago" and was told she bruised ribs on the right, but that pain went away.  In the past week or so the pain has returned and is "really bad."  jkl

## 2011-07-18 ENCOUNTER — Telehealth: Payer: Self-pay | Admitting: Oncology

## 2011-07-18 ENCOUNTER — Ambulatory Visit (HOSPITAL_BASED_OUTPATIENT_CLINIC_OR_DEPARTMENT_OTHER): Payer: Medicare Other | Admitting: Oncology

## 2011-07-18 VITALS — BP 120/70 | HR 76 | Temp 97.0°F | Ht 64.0 in | Wt 112.4 lb

## 2011-07-18 DIAGNOSIS — C649 Malignant neoplasm of unspecified kidney, except renal pelvis: Secondary | ICD-10-CM

## 2011-07-18 DIAGNOSIS — C7889 Secondary malignant neoplasm of other digestive organs: Secondary | ICD-10-CM

## 2011-07-18 NOTE — Progress Notes (Signed)
Hematology and Oncology Follow Up Visit  [name] [id number] 30-Oct-1932 78 y.o. 07/18/2011 9:03 AM  CC: [name], M.D.  [name], M.D.   Principle Diagnosis: This is a 78 year old female with renal cell carcinoma initially diagnosed with stage IIIB in August 2001.  She has developed a lung metastasis with stage IV disease in 2007. And recently found to have a pancreatic lesion.   Prior Therapy: 1.         She had underwent a radical nephrectomy in August 2001.  She had a stage IIIB disease. 2. The patient developed pulmonary metastases in July 2007 status post surgical resection.  She has not had any recurrent disease since that time. 3.         S/P Cryoablation of a left kidney mass done in 09/2010 Done by IR. 4.         S/P recent FNA of a pancreatic lesion by Dr. Christella [name]. Pathology indicate metastatic renal cell cancer in 04/2011.   Current therapy: Observation and surveillance.  Interim History: [name] presents today for a followup visit.  She has continued to do relatively fair.  She had not rally report any clear-cut pulmonary symptoms such as chest pain, shortness of breath, difficulty breathing.  She had not reported any hemoptysis.  She had not reported any abdominal pain.  She had not reported any discomfort.  Her energy and performance status, although marginal is unchanged. As mentioned above, she had a pancreatic biopsy which confirmed the presence of renal cell cancer. She report no abdominal pain. No nausea or vomiting.   She has a good quality of life at this point. She is eating better and have gained 3 lbs.   Medications: I have reviewed the patient's current medications. Current outpatient prescriptions:buPROPion (WELLBUTRIN SR) 150 MG 12 hr tablet, Take 1 tablet by mouth Twice daily., Disp: , Rfl: ;  Calcium Carbonate-Vitamin D (CALCIUM + D PO), Take by mouth daily.  , Disp: , Rfl: ;  folic acid (FOLVITE) 400 MCG tablet, Take 400 mcg by  mouth daily.  , Disp: , Rfl: ;  Multiple Vitamin (MULTIVITAMIN) tablet, Take 1 tablet by mouth daily.  , Disp: , Rfl:  Omega-3 Fatty Acids (FISH OIL) 1200 MG CAPS, Take by mouth daily.  , Disp: , Rfl: ;  omeprazole (PRILOSEC) 20 MG capsule, Take 1 tablet by mouth daily., Disp: , Rfl: ;  PARoxetine (PAXIL) 10 MG tablet, Take 1 tablet by mouth daily., Disp: , Rfl: ;  vitamin B-12 (CYANOCOBALAMIN) 1000 MCG tablet, Take 1,000 mcg by mouth daily.  , Disp: , Rfl:   Allergies: No Known Allergies  Past Medical History, Surgical history, Social history, and Family History were reviewed and updated.  Review of Systems: Constitutional:  Negative for fever, chills, night sweats, anorexia, weight loss, pain. Cardiovascular: no chest pain or dyspnea on exertion Respiratory: no cough, shortness of breath, or wheezing Neurological: no TIA or stroke symptoms Dermatological: negative ENT: negative Skin: Negative. Gastrointestinal: no abdominal pain, change in bowel habits, or black or bloody stools Genito-Urinary: no dysuria, trouble voiding, or hematuria Hematological and Lymphatic: negative Breast: negative for breast lumps Musculoskeletal: negative Remaining ROS negative. Physical Exam: Blood pressure 120/70, pulse 76, temperature 97 F (36.1 C), temperature source Oral, height 5\' 4"  (1.626 m), weight 112 lb 6.4 oz (50.984 kg). ECOG: 1 General appearance: Alert, awake and frail appearing women. Head: Normocephalic, without obvious abnormality, atraumatic Neck: no adenopathy, no carotid bruit, no JVD, supple, symmetrical,  trachea midline and thyroid not enlarged, symmetric, no tenderness/mass/nodules Lymph nodes: Cervical, supraclavicular, and axillary nodes normal. Heart:regular rate and rhythm, S1, S2 normal, no murmur, click, rub or gallop Lung:chest clear, no wheezing, rales, normal symmetric air entry Abdomin: soft, non-tender, without masses or organomegaly EXT:no erythema, induration, or  nodules   Lab Results: Lab Results  Component Value Date   WBC 5.1 07/16/2011   HGB 11.6 07/16/2011   HCT 34.4* 07/16/2011   MCV 92.6 07/16/2011   PLT 222 07/16/2011     Chemistry      Component Value Date/Time   NA 144 07/16/2011 0928   NA 140 04/17/2011 1329   K 4.8* 07/16/2011 0928   K 4.2 04/17/2011 1329   CL 100 07/16/2011 0928   CL 107 04/17/2011 1329   CO2 28 07/16/2011 0928   CO2 26 04/17/2011 1329   BUN 22 07/16/2011 0928   BUN 28* 04/17/2011 1329   CREATININE 1.3* 07/16/2011 0928   CREATININE 1.34* 04/17/2011 1329      Component Value Date/Time   CALCIUM 8.8 07/16/2011 0928   CALCIUM 9.1 04/17/2011 1329   ALKPHOS 107* 07/16/2011 0928   ALKPHOS 84 04/17/2011 1329   AST 29 07/16/2011 0928   AST 24 04/17/2011 1329   ALT 11 04/17/2011 1329   BILITOT 0.70 07/16/2011 0928   BILITOT 0.4 04/17/2011 1329     CT CHEST WITH CONTRAST AND CT ABDOMEN WITHOUT AND WITH CONTRAST  Technique: Multidetector CT imaging of the abdomen was performed  without intravenous contrast. Multidetector CT imaging of the chest  and abdomen was then performed during bolus administration of  intravenous contrast.  Contrast: OMNIPAQUE IOHEXOL 300 MG/ML IJ SOLN  Comparison: [facility] abdominal MRI exam from  11/16/2010. [facility] abdomen  and pelvisCT exam from 08/14/2010. [facility] chest CT  exam from 01/25/2010.  CT CHEST  Findings: There is no axillary, mediastinal, or hilar  lymphadenopathy. Heart size is normal. Coronary artery  calcification is noted. No pericardial or pleural effusion.  Tiny right thyroid nodule noted.  Pleuroparenchymal scarring is noted in the lung apices. 6 mm  nodule identified in the right middle lobe on image 54 is unchanged  since 01/25/2010. There is some anteromedial wall airspace disease  in the right upper lobe, just anterior to the minor fissure. This  is new since the prior chest CT. 8 mm right upper  lobe pulmonary  nodule on image 28 has increased in size from 4 mm on the study of  01/25/2010. 9 mm right lower lobe pulmonary nodule on image 74 has  increased from 6 mm on the 01/25/2010 exam. The 8 mm left lower  lobe pulmonary nodule on image 72 has increased from 5 mm  previously. Left lower lobe suture line and adjacent scarring is  stable.  Bone windows reveal no worrisome lytic or sclerotic osseous  lesions.  IMPRESSION:  Four pulmonary nodules are identified. Three of the four have  progressed in the interval and one has remained relatively stable  over the last 18 months of imaging follow-up. Slowly progressive  metastatic disease is a concern.  Tiny right thyroid nodule may be of no clinical significance in  this individual. Attention on follow-up imaging is recommended.  CT ABDOMEN/PELVIS  Findings: No focal abnormalities seen in the liver or spleen. The  stomach, duodenum, and left adrenal gland are unremarkable.  Gallbladder is unremarkable.  15 mm hypervascular lesion in the body the  pancreas has increased  since the prior CT of 01/25/2010, when it measured about 9 mm. It  is also increased in size when comparing to the CT scan of  08/14/2010 when it measured 12 mm.  The two previously identified areas of hyperenhancement in the  ablation bed of the interpolar left kidney are again identified.  The more posterior of the two measures 10 x 12 mm today compared 8  x 14 mm previously. The more anterior of the two measures 12 x 7  mm today compared to 12 x 6 mm previously. There has been some  continued interval organization of the ablation bed. The left  renal artery and vein are patent.  The right kidney and adrenal gland are surgically absent.  Bone windows reveal no worrisome lytic or sclerotic osseous  lesions.  IMPRESSION:  To areas of nodular enhancement are identified in the left renal  ablation defect. The patient has had interval repeat cryo  ablation, but  the nodules are very similar in appearance to the  08/14/2010 exam which was prior to the second cryo ablation  therapy. These appear to enhance more avidly than the background  renal cortex and also washout more quickly than the background  cortex. These enhancement characteristics are similar to what was  seen in the renal lesion on the 01/25/2010 pretreatment exam. As  such, the areas of nodular enhancement must be considered  suspicious for recurrent disease.  Continued further progression of the hypervascular lesion in the  body the pancreas. The main imaging differential would be  metastatic deposit versus hypervascular islet cell tumor.     Impression and Plan:  This is a pleasant 78 year old female with the following issues: 1. Stage IV renal cell carcinoma, clear cell histology resected initially in 2001.  She had a metastasectomy in 2007 for a lung lesion.  She is also S/P recent Cryoablation of a Left renal mass. Now she has documented pancreatic lesion biopsy proven to be renal in etiology. Option of treatment discussed today with S[place]ette, her sister again today . Systemic therapy might be an option (TKI of VEGEF of MTOR inhibitors) these can be reserved if she develops wide spread disease. I still think the benefits do not justify the risks at this time. Observation might be the best choice at this time. Will arrange for follow up CTs in 6 months. 2. Depression.  She is doing better from that 3. Weight loss.  That has improved now. 4.         Left Kidney mass: I doubt any local therapy can be done any more. I will discuss this with Dr. Lowella Dandy and Dr. Laverle Patter to see if surgery is an option. We might have to observe this given her age and PS.  [facility], MD 3/15/20139:03 AM

## 2011-07-18 NOTE — Telephone Encounter (Signed)
appts made and printed for pt aom °

## 2011-10-21 ENCOUNTER — Ambulatory Visit (HOSPITAL_BASED_OUTPATIENT_CLINIC_OR_DEPARTMENT_OTHER): Payer: Medicare Other | Admitting: Oncology

## 2011-10-21 ENCOUNTER — Telehealth: Payer: Self-pay | Admitting: Oncology

## 2011-10-21 ENCOUNTER — Other Ambulatory Visit (HOSPITAL_BASED_OUTPATIENT_CLINIC_OR_DEPARTMENT_OTHER): Payer: Medicare Other | Admitting: Lab

## 2011-10-21 VITALS — BP 112/64 | HR 74 | Temp 96.9°F | Ht 64.0 in | Wt 108.9 lb

## 2011-10-21 DIAGNOSIS — C7800 Secondary malignant neoplasm of unspecified lung: Secondary | ICD-10-CM

## 2011-10-21 DIAGNOSIS — C7889 Secondary malignant neoplasm of other digestive organs: Secondary | ICD-10-CM

## 2011-10-21 DIAGNOSIS — C649 Malignant neoplasm of unspecified kidney, except renal pelvis: Secondary | ICD-10-CM

## 2011-10-21 LAB — COMPREHENSIVE METABOLIC PANEL
ALT: <8 U/L (ref 0–35)
Albumin: 3.8 g/dL (ref 3.5–5.2)
CO2: 24 mEq/L (ref 19–32)
Calcium: 9.1 mg/dL (ref 8.4–10.5)
Chloride: 106 mEq/L (ref 96–112)
Glucose, Bld: 81 mg/dL (ref 70–99)
Potassium: 4.9 mEq/L (ref 3.5–5.3)
Sodium: 138 mEq/L (ref 135–145)
Total Protein: 6.1 g/dL (ref 6.0–8.3)

## 2011-10-21 LAB — CBC WITH DIFFERENTIAL/PLATELET
BASO%: 1 % (ref 0.0–2.0)
Eosinophils Absolute: 0.2 10*3/uL (ref 0.0–0.5)
LYMPH%: 19.7 % (ref 14.0–49.7)
MONO#: 0.5 10*3/uL (ref 0.1–0.9)
NEUT#: 4.5 10*3/uL (ref 1.5–6.5)
Platelets: 275 10*3/uL (ref 145–400)
RBC: 3.65 10*6/uL — ABNORMAL LOW (ref 3.70–5.45)
RDW: 14.4 % (ref 11.2–14.5)
WBC: 6.6 10*3/uL (ref 3.9–10.3)
lymph#: 1.3 10*3/uL (ref 0.9–3.3)

## 2011-10-21 NOTE — Progress Notes (Signed)
Hematology and Oncology Follow Up Visit  [name] [id number] 1932/08/17 78 y.o. 10/21/2011 9:02 AM  CC: [name], M.D.  [name], M.D.   Principle Diagnosis: This is a 78 year old female with renal cell carcinoma initially diagnosed with stage IIIB in August 2001.  She has developed a lung metastasis with stage IV disease in 2007. And recently found to have a pancreatic lesion.   Prior Therapy: 1.         She had underwent a radical nephrectomy in August 2001.  She had a stage IIIB disease. 2. The patient developed pulmonary metastases in July 2007 status post surgical resection.  She has not had any recurrent disease since that time. 3.         S/P Cryoablation of a left kidney mass done in 09/2010 Done by IR. 4.         S/P recent FNA of a pancreatic lesion by Dr. Christella [name]. Pathology indicate metastatic renal cell cancer in 04/2011.   Current therapy: Observation and surveillance.  Interim History: [name] presents today for a followup visit.  She has continued to do relatively fair.  She had not rally report any  pulmonary symptoms such as chest pain, shortness of breath, difficulty breathing.  She had not reported any hemoptysis.  She had not reported any abdominal pain.  She had not reported any discomfort.  Her energy and performance status, although marginal is unchanged. As mentioned above, she had a pancreatic biopsy which confirmed the presence of renal cell cancer. She report no abdominal pain. No nausea or vomiting.   She has a good quality of life at this point. Although she is eating better, she have lost 3 lbs. She is able to swallow food without a problem. She reports mainly she forgets to eat.    Medications: I have reviewed the patient's current medications. Current outpatient prescriptions:buPROPion (WELLBUTRIN SR) 150 MG 12 hr tablet, Take 1 tablet by mouth Twice daily., Disp: , Rfl: ;  Calcium Carbonate-Vitamin D (CALCIUM + D PO), Take by  mouth daily.  , Disp: , Rfl: ;  folic acid (FOLVITE) 400 MCG tablet, Take 400 mcg by mouth daily.  , Disp: , Rfl: ;  Multiple Vitamin (MULTIVITAMIN) tablet, Take 1 tablet by mouth daily.  , Disp: , Rfl:  Omega-3 Fatty Acids (FISH OIL) 1200 MG CAPS, Take by mouth daily.  , Disp: , Rfl: ;  vitamin B-12 (CYANOCOBALAMIN) 1000 MCG tablet, Take 1,000 mcg by mouth daily.  , Disp: , Rfl: ;  omeprazole (PRILOSEC) 20 MG capsule, Take 1 tablet by mouth daily., Disp: , Rfl: ;  PARoxetine (PAXIL) 10 MG tablet, Take 1 tablet by mouth daily., Disp: , Rfl:   Allergies: No Known Allergies  Past Medical History, Surgical history, Social history, and Family History were reviewed and updated.  Review of Systems: Constitutional:  Negative for fever, chills, night sweats, anorexia, weight loss, pain. Cardiovascular: no chest pain or dyspnea on exertion Respiratory: no cough, shortness of breath, or wheezing Neurological: no TIA or stroke symptoms Dermatological: negative ENT: negative Skin: Negative. Gastrointestinal: no abdominal pain, change in bowel habits, or black or bloody stools Genito-Urinary: no dysuria, trouble voiding, or hematuria Hematological and Lymphatic: negative Breast: negative for breast lumps Musculoskeletal: negative Remaining ROS negative. Physical Exam: Blood pressure 112/64, pulse 74, temperature 96.9 F (36.1 C), temperature source Oral, height 5\' 4"  (1.626 m), weight 108 lb 14.4 oz (49.397 kg). ECOG: 1 General appearance: Alert, awake and frail  appearing women. Head: Normocephalic, without obvious abnormality, atraumatic Neck: no adenopathy, no carotid bruit, no JVD, supple, symmetrical, trachea midline and thyroid not enlarged, symmetric, no tenderness/mass/nodules Lymph nodes: Cervical, supraclavicular, and axillary nodes normal. Heart:regular rate and rhythm, S1, S2 normal, no murmur, click, rub or gallop Lung:chest clear, no wheezing, rales, normal symmetric air entry Abdomin:  soft, non-tender, without masses or organomegaly EXT:no erythema, induration, or nodules   Lab Results: Lab Results  Component Value Date   WBC 6.6 10/21/2011   HGB 10.9* 10/21/2011   HCT 33.1* 10/21/2011   MCV 90.9 10/21/2011   PLT 275 10/21/2011     Chemistry      Component Value Date/Time   NA 144 07/16/2011 0928   NA 140 04/17/2011 1329   K 4.8* 07/16/2011 0928   K 4.2 04/17/2011 1329   CL 100 07/16/2011 0928   CL 107 04/17/2011 1329   CO2 28 07/16/2011 0928   CO2 26 04/17/2011 1329   BUN 22 07/16/2011 0928   BUN 28* 04/17/2011 1329   CREATININE 1.3* 07/16/2011 0928   CREATININE 1.34* 04/17/2011 1329      Component Value Date/Time   CALCIUM 8.8 07/16/2011 0928   CALCIUM 9.1 04/17/2011 1329   ALKPHOS 107* 07/16/2011 0928   ALKPHOS 84 04/17/2011 1329   AST 29 07/16/2011 0928   AST 24 04/17/2011 1329   ALT 11 04/17/2011 1329   BILITOT 0.70 07/16/2011 0928   BILITOT 0.4 04/17/2011 1329       Impression and Plan:  This is a pleasant 78 year old female with the following issues: 1. Stage IV renal cell carcinoma, clear cell histology resected initially in 2001.  She had a metastasectomy in 2007 for a lung lesion.  She is also S/P recent Cryoablation of a Left renal mass. Now she has documented pancreatic lesion biopsy proven to be renal in etiology. Option of treatment discussed today with [name], her sisters again today . Systemic therapy might be an option (TKI of VEGEF of MTOR inhibitors) these can be reserved if she develops wide spread disease. I still think the benefits do not justify the risks at this time. Observation might be the best choice at this time. Will arrange for follow up CTs in 3 months. 2. Depression.  She is doing better from that 3. Weight loss.  I encouraged her to schedule meals and use boost or ensure in addition to meals.  4.         Left Kidney mass: I doubt any local therapy can be done any more. Continued observation for now.  [facility],  MD 6/18/20139:02 AM

## 2011-10-21 NOTE — Telephone Encounter (Signed)
appts made and printed for pt aom °

## 2011-11-12 ENCOUNTER — Encounter: Payer: Self-pay | Admitting: Dietician

## 2011-11-12 NOTE — Progress Notes (Signed)
Brief Out-patient Oncology Nutrition Note  Reason: Patient screened positive for nutrition risk for unintentional weight loss and decreased appetite.   [name] is a 78 year old female patient of Dr. Clelia Croft, diagnosed with renal cancer. Contacted patient via telephone for nutrition risk. Patient reported her appetite is getting a little better. She stated some days her appetite is better than others. She reported today she has had two good meals. She reported she drinks 2 Ensure nutrition supplements daily. She reported her MD would like her to drink 3 Ensure daily.   Wt Readings from Last 10 Encounters:  10/21/11 108 lb 14.4 oz (49.397 kg)  07/18/11 112 lb 6.4 oz (50.984 kg)  04/17/11 109 lb 11.2 oz (49.76 kg)  04/10/11 108 lb (48.988 kg)  04/10/11 108 lb (48.988 kg)  01/07/11 110 lb (49.896 kg)  12/04/10 110 lb (49.896 kg)  08/26/10 110 lb (49.896 kg)  08/19/10 107 lb (48.535 kg)  08/14/10 111 lb (50.349 kg)   BMI: 18.5 kg/m^2  I have educated the patient on a high calorie, high protein diet and nutritional symptom management for nausea. We discussed higher calorie, higher protein foods. Patient declines nutrition appointment. Patient was without any nutrition related questions.   RD available for nutrition needs.   Iven Finn [facility] [phone]

## 2012-01-20 ENCOUNTER — Other Ambulatory Visit (HOSPITAL_BASED_OUTPATIENT_CLINIC_OR_DEPARTMENT_OTHER): Payer: Medicare Other | Admitting: Lab

## 2012-01-20 ENCOUNTER — Other Ambulatory Visit: Payer: Self-pay | Admitting: Oncology

## 2012-01-20 ENCOUNTER — Ambulatory Visit (HOSPITAL_COMMUNITY)
Admission: RE | Admit: 2012-01-20 | Discharge: 2012-01-20 | Disposition: A | Discharge status: Home / Self Care | Payer: Medicare Other | Source: Ambulatory Visit | Attending: Oncology | Admitting: Oncology

## 2012-01-20 DIAGNOSIS — R918 Other nonspecific abnormal finding of lung field: Secondary | ICD-10-CM | POA: Insufficient documentation

## 2012-01-20 DIAGNOSIS — J479 Bronchiectasis, uncomplicated: Secondary | ICD-10-CM | POA: Insufficient documentation

## 2012-01-20 DIAGNOSIS — C649 Malignant neoplasm of unspecified kidney, except renal pelvis: Secondary | ICD-10-CM

## 2012-01-20 DIAGNOSIS — I2510 Atherosclerotic heart disease of native coronary artery without angina pectoris: Secondary | ICD-10-CM | POA: Insufficient documentation

## 2012-01-20 LAB — COMPREHENSIVE METABOLIC PANEL (CC13)
ALT: 10 U/L (ref 0–55)
Albumin: 3.6 g/dL (ref 3.5–5.0)
Alkaline Phosphatase: 89 U/L (ref 40–150)
CO2: 19 mEq/L — ABNORMAL LOW (ref 22–29)
Calcium: 9.7 mg/dL (ref 8.4–10.4)
Glucose: 78 mg/dl (ref 70–99)
Potassium: 4.3 mEq/L (ref 3.5–5.1)
Sodium: 138 mEq/L (ref 136–145)
Total Bilirubin: 0.8 mg/dL (ref 0.20–1.20)
Total Protein: 7.1 g/dL (ref 6.4–8.3)

## 2012-01-20 LAB — CBC WITH DIFFERENTIAL/PLATELET
BASO%: 1.5 % (ref 0.0–2.0)
Eosinophils Absolute: 0.2 10*3/uL (ref 0.0–0.5)
LYMPH%: 37.6 % (ref 14.0–49.7)
MCHC: 33.8 g/dL (ref 31.5–36.0)
MONO#: 0.5 10*3/uL (ref 0.1–0.9)
NEUT#: 2.6 10*3/uL (ref 1.5–6.5)
RBC: 4.11 10*6/uL (ref 3.70–5.45)
RDW: 14 % (ref 11.2–14.5)
WBC: 5.4 10*3/uL (ref 3.9–10.3)

## 2012-01-23 ENCOUNTER — Telehealth: Payer: Self-pay | Admitting: Oncology

## 2012-01-23 ENCOUNTER — Ambulatory Visit (HOSPITAL_BASED_OUTPATIENT_CLINIC_OR_DEPARTMENT_OTHER): Payer: Medicare Other | Admitting: Oncology

## 2012-01-23 VITALS — BP 106/65 | HR 88 | Temp 97.1°F | Resp 20 | Ht 64.0 in | Wt 111.1 lb

## 2012-01-23 DIAGNOSIS — C649 Malignant neoplasm of unspecified kidney, except renal pelvis: Secondary | ICD-10-CM

## 2012-01-23 DIAGNOSIS — C7889 Secondary malignant neoplasm of other digestive organs: Secondary | ICD-10-CM

## 2012-01-23 DIAGNOSIS — C7800 Secondary malignant neoplasm of unspecified lung: Secondary | ICD-10-CM

## 2012-01-23 NOTE — Progress Notes (Signed)
Hematology and Oncology Follow Up Visit  [name] [id number] 12-10-1932 78 y.o. 01/23/2012 8:45 AM  CC: [name], M.D.  [name], M.D.   Principle Diagnosis: This is a 78 year old female with renal cell carcinoma initially diagnosed with stage IIIB in August 2001.  She has developed a lung metastasis with stage IV disease in 2007. And recently found to have a pancreatic lesion.   Prior Therapy: 1.         She had underwent a radical nephrectomy in August 2001.  She had a stage IIIB disease. 2. The patient developed pulmonary metastases in July 2007 status post surgical resection.  She has not had any recurrent disease since that time. 3.         S/P Cryoablation of a left kidney mass done in 09/2010 Done by IR. 4.         S/P recent FNA of a pancreatic lesion by Dr. Christella [name]. Pathology indicate metastatic renal cell cancer in 04/2011.   Current therapy: Observation and surveillance.  Interim History: [name] presents today for a followup visit.  She has continued to do relatively fair.  She had not rally report any  pulmonary symptoms such as chest pain, shortness of breath, difficulty breathing.  She had not reported any hemoptysis.  She had not reported any abdominal pain.  She had not reported any discomfort.  Her energy and performance status, although marginal is unchanged. As mentioned above, she had a pancreatic biopsy which confirmed the presence of renal cell cancer. She report no abdominal pain. No nausea or vomiting.   She has a good quality of life at this point. She is eating better and gained 3 lbs . She is able to swallow food without a problem.   Medications: I have reviewed the patient's current medications. Current outpatient prescriptions:buPROPion (WELLBUTRIN SR) 150 MG 12 hr tablet, Take 1 tablet by mouth Twice daily., Disp: , Rfl: ;  Calcium Carbonate-Vitamin D (CALCIUM + D PO), Take by mouth daily.  , Disp: , Rfl: ;  folic acid (FOLVITE)  400 MCG tablet, Take 400 mcg by mouth daily.  , Disp: , Rfl: ;  Multiple Vitamin (MULTIVITAMIN) tablet, Take 1 tablet by mouth daily.  , Disp: , Rfl:  Omega-3 Fatty Acids (FISH OIL) 1200 MG CAPS, Take by mouth daily.  , Disp: , Rfl: ;  omeprazole (PRILOSEC) 20 MG capsule, Take 1 tablet by mouth daily., Disp: , Rfl: ;  PARoxetine (PAXIL) 10 MG tablet, Take 1 tablet by mouth daily., Disp: , Rfl: ;  vitamin B-12 (CYANOCOBALAMIN) 1000 MCG tablet, Take 1,000 mcg by mouth daily.  , Disp: , Rfl: ;  MEGACE ES 625 MG/5ML suspension, Take 625 mg by mouth., Disp: , Rfl:   Allergies: No Known Allergies  Past Medical History, Surgical history, Social history, and Family History were reviewed and updated.  Review of Systems: Constitutional:  Negative for fever, chills, night sweats, anorexia, weight loss, pain. Cardiovascular: no chest pain or dyspnea on exertion Respiratory: no cough, shortness of breath, or wheezing Neurological: no TIA or stroke symptoms Dermatological: negative ENT: negative Skin: Negative. Gastrointestinal: no abdominal pain, change in bowel habits, or black or bloody stools Genito-Urinary: no dysuria, trouble voiding, or hematuria Hematological and Lymphatic: negative Breast: negative for breast lumps Musculoskeletal: negative Remaining ROS negative. Physical Exam: Blood pressure 106/65, pulse 88, temperature 97.1 F (36.2 C), temperature source Oral, resp. rate 20, height 5\' 4"  (1.626 m), weight 111 lb 1.6 oz (50.395  kg). ECOG: 1 General appearance: Alert, awake and frail appearing women. Head: Normocephalic, without obvious abnormality, atraumatic Neck: no adenopathy, no carotid bruit, no JVD, supple, symmetrical, trachea midline and thyroid not enlarged, symmetric, no tenderness/mass/nodules Lymph nodes: Cervical, supraclavicular, and axillary nodes normal. Heart:regular rate and rhythm, S1, S2 normal, no murmur, click, rub or gallop Lung:chest clear, no wheezing, rales,  normal symmetric air entry Abdomin: soft, non-tender, without masses or organomegaly EXT:no erythema, induration, or nodules   Lab Results: Lab Results  Component Value Date   WBC 5.4 01/20/2012   HGB 12.9 01/20/2012   HCT 38.1 01/20/2012   MCV 92.8 01/20/2012   PLT 268 01/20/2012     Chemistry      Component Value Date/Time   NA 138 01/20/2012 0757   NA 138 10/21/2011 0818   NA 144 07/16/2011 0928   K 4.3 01/20/2012 0757   K 4.9 10/21/2011 0818   K 4.8* 07/16/2011 0928   CL 109* 01/20/2012 0757   CL 106 10/21/2011 0818   CL 100 07/16/2011 0928   CO2 19* 01/20/2012 0757   CO2 24 10/21/2011 0818   CO2 28 07/16/2011 0928   BUN 31.0* 01/20/2012 0757   BUN 32* 10/21/2011 0818   BUN 22 07/16/2011 0928   CREATININE 1.6* 01/20/2012 0757   CREATININE 1.25* 10/21/2011 0818   CREATININE 1.3* 07/16/2011 0928      Component Value Date/Time   CALCIUM 9.7 01/20/2012 0757   CALCIUM 9.1 10/21/2011 0818   CALCIUM 8.8 07/16/2011 0928   ALKPHOS 89 01/20/2012 0757   ALKPHOS 81 10/21/2011 0818   ALKPHOS 107* 07/16/2011 0928   AST 19 01/20/2012 0757   AST 16 10/21/2011 0818   AST 29 07/16/2011 0928   ALT 10 01/20/2012 0757   ALT <8 10/21/2011 0818   BILITOT 0.80 01/20/2012 0757   BILITOT 0.4 10/21/2011 0818   BILITOT 0.70 07/16/2011 0928     CT CHEST, ABDOMEN AND PELVIS WITHOUT CONTRAST  Technique: Multidetector CT imaging of the chest, abdomen and  pelvis was performed following the standard protocol without IV  contrast.  Comparison: 07/16/2011  CT CHEST  Findings:  No axillary or supraclavicular adenopathy. No mediastinal or hilar  adenopathy. Calcifications involving the LAD coronary artery  identified. No pericardial or pleural effusions. Progressive  consolidation and bronchiectasis within the right middle lobe and  medial left lower lobe.  Pulmonary nodule within the left lower lobe measures 8.6 mm, image  46. Previously 8.1 mm. Index nodule in the right lower lobe  measures 10.1 mm, image 49.  Previously 9.2 mm. Index right upper  lobe pulmonary nodule measures 7.9 mm, image 24. Previously 7.7  mm.  Review of the visualized osseous structures is significant for  osteopenia. No worrisome lytic or sclerotic bone lesions.  IMPRESSION:  1. Index pulmonary nodules are stable to minimally increased in  size in the interval.  2. Progressive consolidation and bronchiectasis involving the  right middle lobe and medial left lower lobe. Favor sequelae of  atypical infection.  CT ABDOMEN AND PELVIS  Findings:  There is no focal liver abnormalities. The gallbladder appears  within normal limits. Lesion within the body of the pancreas  measures lesion within the body of pancreas is difficult to see  given the lack of IV contrast material. I believe this measures  1.4 cm on today's study which is not significantly changed from  previous exam, image 64.  Normal appearance of the spleen. Postoperative change from right  adrenalectomy  and nephrectomy. The left adrenal gland appears  normal. Post ablation changes involving the interpolar region of  the left kidney are again noted. Areas of residual enhancement  (indicating residual/recurrent tumor) cannot be re-assessed due to  lack of intravenous contrast material.  The urinary bladder appears normal. The uterus and the left adnexa  appears normal. There is a cyst in the right ovary which measures  1.4 cm, image 93.  No enlarged lymph nodes identified within the upper abdomen. No  pelvic or inguinal adenopathy. No free fluid or fluid collections  identified.  The stomach appears within normal limits. The small bowel loops  have a normal caliber. The appendix is identified and appears  normal. Normal appearance of the colon.  Review of the visualized osseous structures is significant for  osteopenia. There are postsurgical changes involving the proximal  right femur. Degenerative disc disease noted within the lumbar  spine.  IMPRESSION:   1. Diminished exam detail due to lack of intravenous contrast  material. Without IV contrast material the previously described  "hypervascular lesion in the body of the pancreas" is difficult to  reassess. Additionally, the previously identified areas of  "hyperenhancement in the ablation bed of the left kidney" also  difficult to distinguish from the adjacent renal parenchyma.  2. No evidence for metastatic adenopathy identified.   Impression and Plan:  This is a pleasant 78 year old female with the following issues: 1. Stage IV renal cell carcinoma, clear cell histology resected initially in 2001.  She had a metastasectomy in 2007 for a lung lesion.  She is also S/P recent Cryoablation of a Left renal mass. Now she has documented pancreatic lesion biopsy proven to be renal in etiology. CT scans discussed today and showed stable disease for the most part. Systemic therapy might be an option for her in the future (TKI of VEGEF of MTOR inhibitors) these can be reserved if she develops wide spread disease. I still think the benefits do not justify the risks at this time. Observation might be the best choice at this time. Will arrange follow up in 3 months.  2. Depression.  She is doing better from that 3. Weight loss.  Improved now.  4.         Left Kidney mass: I doubt any local therapy can be done any more. Continued observation for now.  [facility], MD 9/20/20138:45 AM

## 2012-01-23 NOTE — Telephone Encounter (Signed)
gv and printed pt schedule.....Marland Kitchensed

## 2012-04-23 ENCOUNTER — Ambulatory Visit (HOSPITAL_BASED_OUTPATIENT_CLINIC_OR_DEPARTMENT_OTHER): Payer: Medicare Other | Admitting: Oncology

## 2012-04-23 ENCOUNTER — Telehealth: Payer: Self-pay | Admitting: Oncology

## 2012-04-23 ENCOUNTER — Other Ambulatory Visit (HOSPITAL_BASED_OUTPATIENT_CLINIC_OR_DEPARTMENT_OTHER): Payer: Medicare Other | Admitting: Lab

## 2012-04-23 VITALS — BP 105/59 | HR 73 | Temp 96.6°F | Resp 20 | Ht 64.0 in | Wt 107.7 lb

## 2012-04-23 DIAGNOSIS — C7800 Secondary malignant neoplasm of unspecified lung: Secondary | ICD-10-CM

## 2012-04-23 DIAGNOSIS — C649 Malignant neoplasm of unspecified kidney, except renal pelvis: Secondary | ICD-10-CM

## 2012-04-23 LAB — COMPREHENSIVE METABOLIC PANEL (CC13)
BUN: 27 mg/dL — ABNORMAL HIGH (ref 7.0–26.0)
CO2: 27 mEq/L (ref 22–29)
Calcium: 9.5 mg/dL (ref 8.4–10.4)
Chloride: 105 mEq/L (ref 98–107)
Creatinine: 1.4 mg/dL — ABNORMAL HIGH (ref 0.6–1.1)
Glucose: 80 mg/dl (ref 70–99)

## 2012-04-23 LAB — CBC WITH DIFFERENTIAL/PLATELET
Basophils Absolute: 0.1 10*3/uL (ref 0.0–0.1)
EOS%: 5.7 % (ref 0.0–7.0)
Eosinophils Absolute: 0.2 10*3/uL (ref 0.0–0.5)
HCT: 34.7 % — ABNORMAL LOW (ref 34.8–46.6)
HGB: 11.8 g/dL (ref 11.6–15.9)
MCH: 31.8 pg (ref 25.1–34.0)
MONO#: 0.4 10*3/uL (ref 0.1–0.9)
NEUT%: 43.4 % (ref 38.4–76.8)
lymph#: 1.5 10*3/uL (ref 0.9–3.3)

## 2012-04-23 NOTE — Telephone Encounter (Signed)
appts made and printed for pt,contrast given and pt aware that she will get a letter with the scan

## 2012-04-23 NOTE — Progress Notes (Signed)
Hematology and Oncology Follow Up Visit  [name] [id number] Apr 13, 1933 78 y.o. 04/23/2012 8:46 AM  CC: Gaynelle Arabian, M.D.  Cameron Proud, M.D.   Principle Diagnosis: This is a 78 year old female with renal cell carcinoma initially diagnosed with stage IIIB in August 2001.  She has developed a lung metastasis with stage IV disease in 2007. And recently found to have a pancreatic lesion.  Prior Therapy: 1.         She had underwent a radical nephrectomy in August 2001.  She had a stage IIIB disease. 2. The patient developed pulmonary metastases in July 2007 status post surgical resection.  She has not had any recurrent disease since that time. 3.         S/P Cryoablation of a left kidney mass done in 09/2010 Done by IR. 4.         S/P recent FNA of a pancreatic lesion by Dr. Christella Hartigan. Pathology indicate metastatic renal cell cancer in 04/2011.   Current therapy: Observation and surveillance.  Interim History: [name] presents today for a followup visit.  She has continued to do relatively fair.  She had not rally report any  pulmonary symptoms such as chest pain, shortness of breath, difficulty breathing. She had not reported any hemoptysis.  She had not reported any abdominal pain.  She had not reported any discomfort.  Her energy and performance status, although marginal is unchanged. She has a good quality of life at this point. She is eating better. She is able to swallow food without a problem.  She is reporting unsteadiness and falls at times. She has a cane but rarely uses it.   Medications: I have reviewed the patient's current medications. Current outpatient prescriptions:buPROPion (WELLBUTRIN SR) 150 MG 12 hr tablet, Take 1 tablet by mouth Twice daily., Disp: , Rfl: ;  Calcium Carbonate-Vitamin D (CALCIUM + D PO), Take by mouth daily.  , Disp: , Rfl: ;  folic acid (FOLVITE) 400 MCG tablet, Take 400 mcg by mouth daily.  , Disp: , Rfl: ;  MEGACE ES 625 MG/5ML  suspension, Take 625 mg by mouth., Disp: , Rfl:  Multiple Vitamin (MULTIVITAMIN) tablet, Take 1 tablet by mouth daily.  , Disp: , Rfl: ;  Omega-3 Fatty Acids (FISH OIL) 1200 MG CAPS, Take by mouth daily.  , Disp: , Rfl: ;  omeprazole (PRILOSEC) 20 MG capsule, Take 1 tablet by mouth daily., Disp: , Rfl: ;  PARoxetine (PAXIL) 10 MG tablet, Take 1 tablet by mouth daily., Disp: , Rfl: ;  vitamin B-12 (CYANOCOBALAMIN) 1000 MCG tablet, Take 1,000 mcg by mouth daily.  , Disp: , Rfl:   Allergies: No Known Allergies  Past Medical History, Surgical history, Social history, and Family History were reviewed and updated.  Review of Systems: Constitutional:  Negative for fever, chills, night sweats, anorexia, weight loss, pain. Cardiovascular: no chest pain or dyspnea on exertion Respiratory: no cough, shortness of breath, or wheezing Neurological: no TIA or stroke symptoms Dermatological: negative ENT: negative Skin: Negative. Gastrointestinal: no abdominal pain, change in bowel habits, or black or bloody stools Genito-Urinary: no dysuria, trouble voiding, or hematuria Hematological and Lymphatic: negative Breast: negative for breast lumps Musculoskeletal: negative Remaining ROS negative. Physical Exam: Blood pressure 105/59, pulse 73, temperature 96.6 F (35.9 C), temperature source Oral, resp. rate 20, height 5\' 4"  (1.626 m), weight 107 lb 11.2 oz (48.852 kg). ECOG: 1 General appearance: Alert, awake and frail appearing women. Head: Normocephalic, without obvious abnormality, atraumatic  Neck: no adenopathy, no carotid bruit, no JVD, supple, symmetrical, trachea midline and thyroid not enlarged, symmetric, no tenderness/mass/nodules Lymph nodes: Cervical, supraclavicular, and axillary nodes normal. Heart:regular rate and rhythm, S1, S2 normal, no murmur, click, rub or gallop Lung:chest clear, no wheezing, rales, normal symmetric air entry Abdomin: soft, non-tender, without masses or  organomegaly EXT:no erythema, induration, or nodules   Lab Results: Lab Results  Component Value Date   WBC 3.9 04/23/2012   HGB 11.8 04/23/2012   HCT 34.7* 04/23/2012   MCV 93.6 04/23/2012   PLT 271 04/23/2012     Chemistry      Component Value Date/Time   NA 138 01/20/2012 0757   NA 138 10/21/2011 0818   NA 144 07/16/2011 0928   K 4.3 01/20/2012 0757   K 4.9 10/21/2011 0818   K 4.8* 07/16/2011 0928   CL 109* 01/20/2012 0757   CL 106 10/21/2011 0818   CL 100 07/16/2011 0928   CO2 19* 01/20/2012 0757   CO2 24 10/21/2011 0818   CO2 28 07/16/2011 0928   BUN 31.0* 01/20/2012 0757   BUN 32* 10/21/2011 0818   BUN 22 07/16/2011 0928   CREATININE 1.6* 01/20/2012 0757   CREATININE 1.25* 10/21/2011 0818   CREATININE 1.3* 07/16/2011 0928      Component Value Date/Time   CALCIUM 9.7 01/20/2012 0757   CALCIUM 9.1 10/21/2011 0818   CALCIUM 8.8 07/16/2011 0928   ALKPHOS 89 01/20/2012 0757   ALKPHOS 81 10/21/2011 0818   ALKPHOS 107* 07/16/2011 0928   AST 19 01/20/2012 0757   AST 16 10/21/2011 0818   AST 29 07/16/2011 0928   ALT 10 01/20/2012 0757   ALT <8 10/21/2011 0818   BILITOT 0.80 01/20/2012 0757   BILITOT 0.4 10/21/2011 0818   BILITOT 0.70 07/16/2011 0928       Impression and Plan:  This is a pleasant 78 year old female with the following issues: 1. Stage IV renal cell carcinoma, clear cell histology resected initially in 2001.  She had a metastasectomy in 2007 for a lung lesion.  She is also S/P recent Cryoablation of a Left renal mass. Now she has documented pancreatic lesion biopsy proven to be renal in etiology. CT scans from 01/2012 showed stable disease for the most part. Systemic therapy might be an option for her in the future (TKI of VEGEF of MTOR inhibitors) these can be reserved if she develops wide spread disease. I still think the benefits do not justify the risks at this time. Observation might be the best choice at this time. Will arrange follow up in 3 months and repeat CT scan then.   2. Depression.  She is doing better from that 3. Weight loss.  Improved now.  4.         Left Kidney mass: I doubt any local therapy can be done any more. Continued observation for now. 5.         Falls and unsteadiness: I encouraged her to use her cane more often at this time.   [facility], MD 12/20/20138:46 AM

## 2012-07-19 ENCOUNTER — Telehealth: Payer: Self-pay | Admitting: Oncology

## 2012-07-20 ENCOUNTER — Other Ambulatory Visit: Payer: Medicare Other

## 2012-07-20 ENCOUNTER — Ambulatory Visit (HOSPITAL_COMMUNITY): Payer: Medicare Other

## 2012-07-21 ENCOUNTER — Telehealth: Payer: Self-pay | Admitting: *Deleted

## 2012-07-21 NOTE — Telephone Encounter (Signed)
Sister rachel calling to say patient broke her hip yesterday and had surgery today, need to cancel appt for 07/22/12.   they will call back to re-schedule, when patient recovers .dr Clelia Croft notified.

## 2012-07-22 ENCOUNTER — Other Ambulatory Visit: Payer: Medicare Other | Admitting: Lab

## 2012-07-22 ENCOUNTER — Ambulatory Visit: Payer: Medicare Other | Admitting: Oncology

## 2012-07-22 ENCOUNTER — Ambulatory Visit (HOSPITAL_COMMUNITY): Payer: Medicare Other

## 2012-08-07 IMAGING — CT CT ABDOMEN WO/W CM
2 of 11 series · 11 of 46 positions shown, 17 images · IV contrast (APPLIED)
Comparison: [HOSPITAL] [abdominal MRI] exam from
and pelvisCT] exam from  08/14/2010. [HOSPITAL] chest CT
exam from 01/25/2010.

CT CHEST

CLINICAL DATA: kidney cancer. The patient is status post left
renal cryo ablation on 04/10/2010.  Subsequently re-treatment by
cryo ablation was performed on 09/13/2010.  Pancreatic lesion is
seen on follow-up.

CT CHEST WITH CONTRAST AND CT ABDOMEN WITHOUT AND WITH CONTRAST
TECHNIQUE: Multidetector CT imaging of the abdomen was performed
without intravenous contrast. Multidetector CT imaging of the chest
and abdomen was then performed during bolus administration of
intravenous contrast.
Contrast: 100mL OMNIPAQUE IOHEXOL 300 MG/ML IJ SOLN

[Series 6: venous · axial · portal-venous · 0.62mm/px · z∈[-360,-62]mm · 9 of 125 slices shown, 15 images]
[im 13/125  soft-tissue]
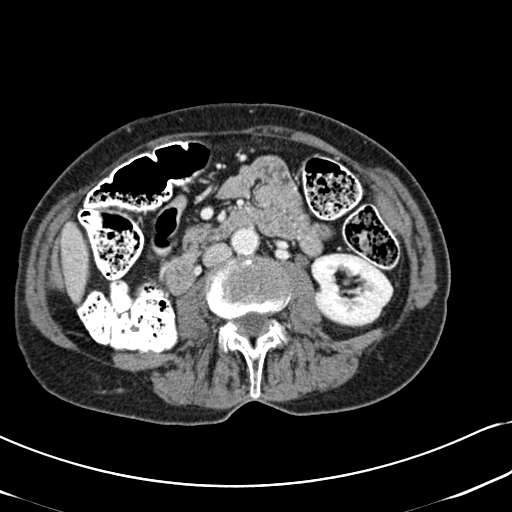
[im 13/125  bone]
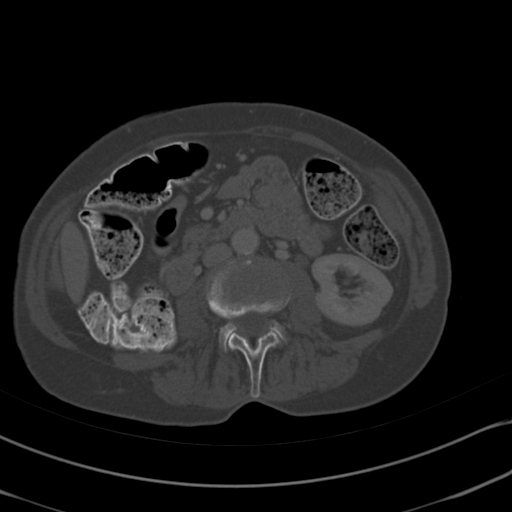
[im 25/125  soft-tissue]
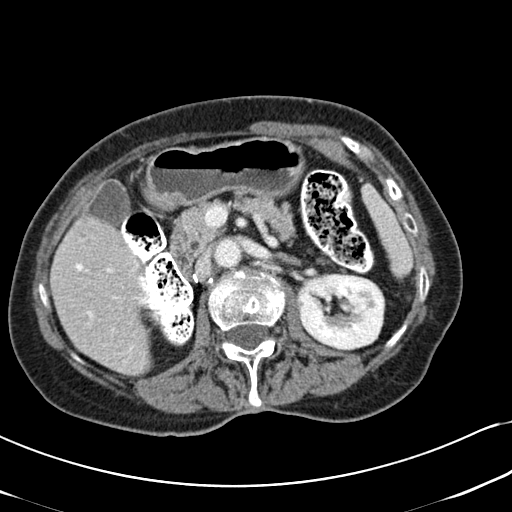
[im 38/125  soft-tissue]
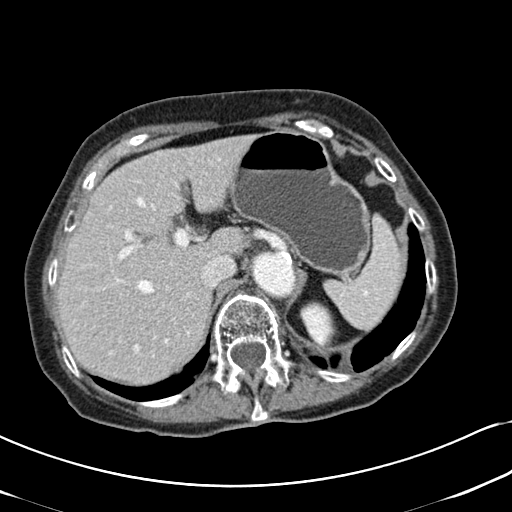
[im 50/125  soft-tissue]
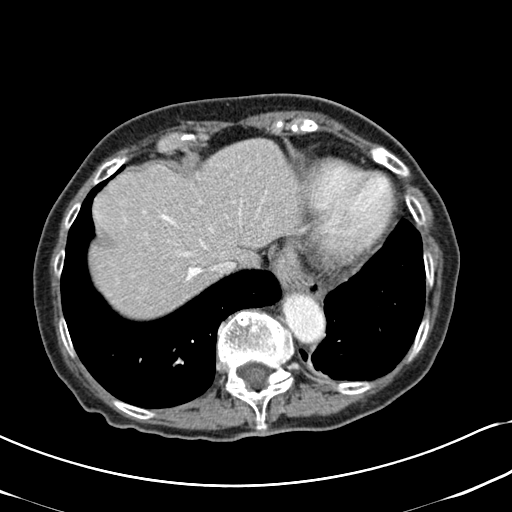
[im 63/125  soft-tissue]
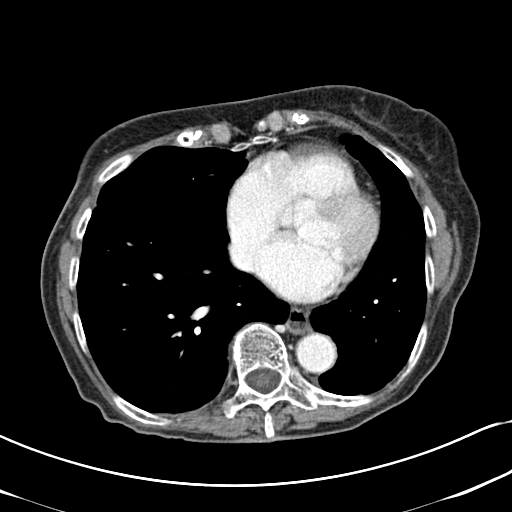
[im 75/125  soft-tissue]
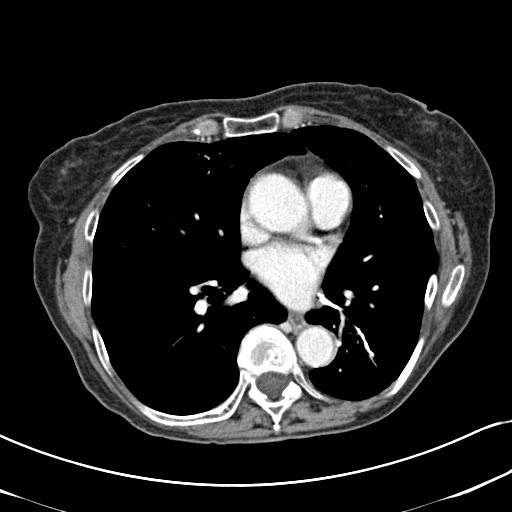
[im 75/125  lung]
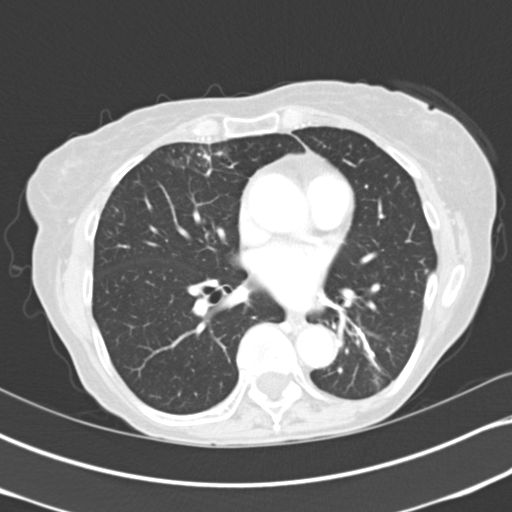
[im 87/125  soft-tissue]
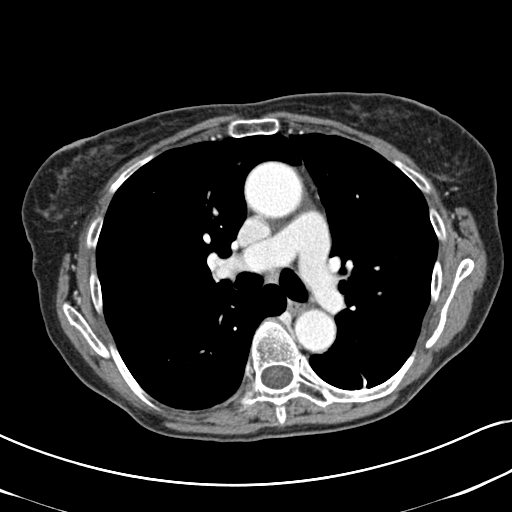
[im 87/125  lung]
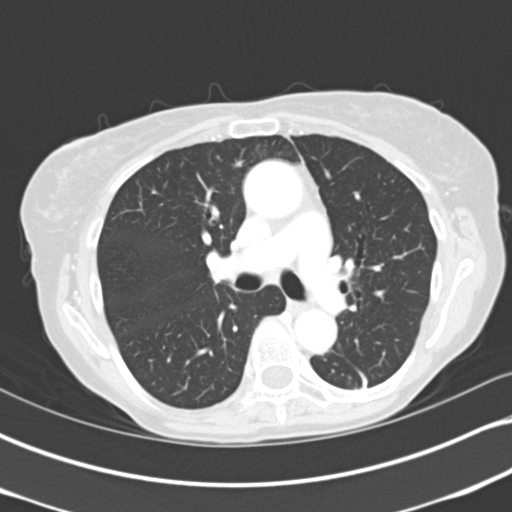
[im 100/125  soft-tissue]
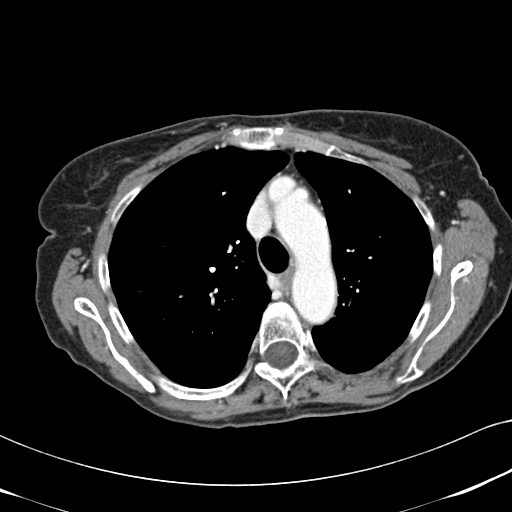
[im 100/125  lung]
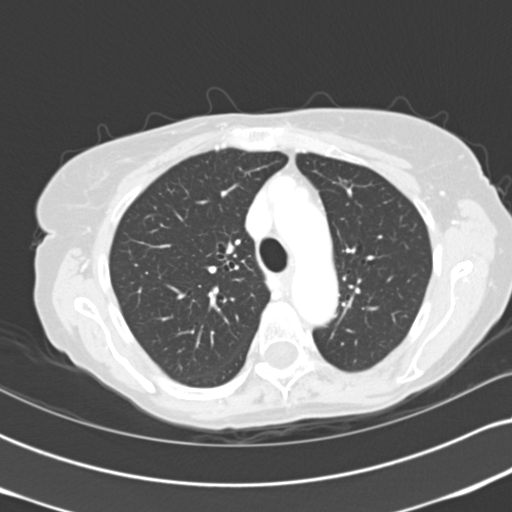
[im 112/125  soft-tissue]
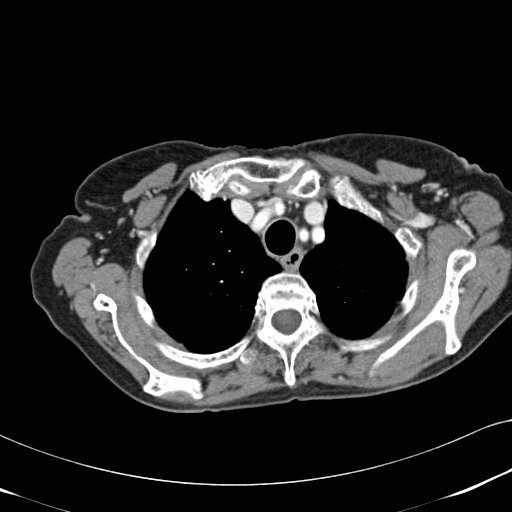
[im 112/125  lung]
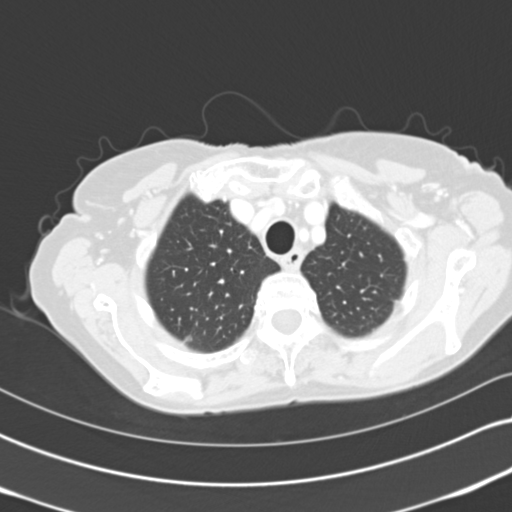
[im 112/125  bone]
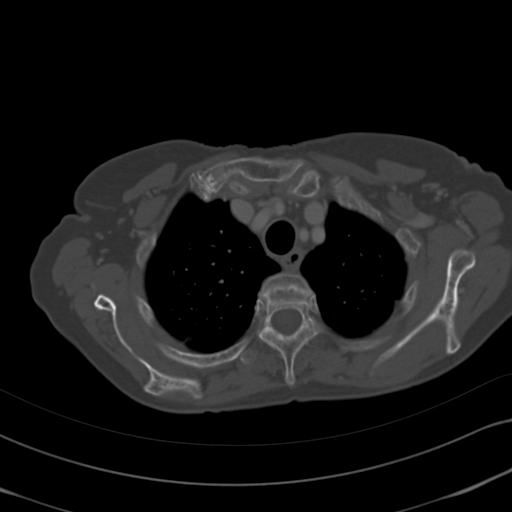

[Series 602: <mpr thick range> · coronal · 0.62mm/px · 2 of 69 slices shown]
[im 23/69  soft-tissue]
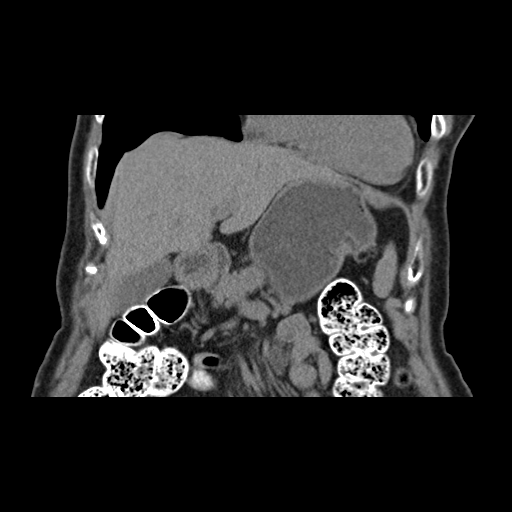
[im 46/69  soft-tissue]
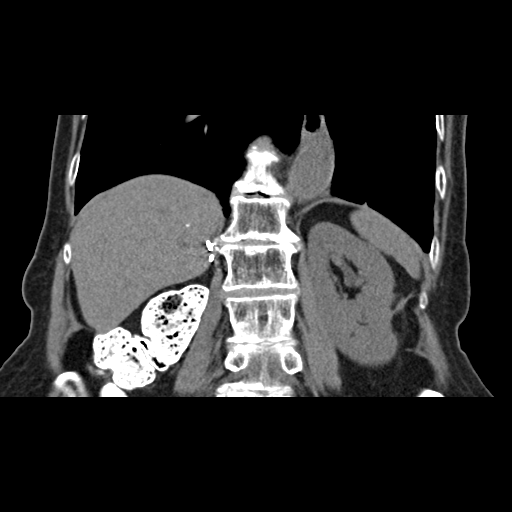

[11 of 46 positions shown; findings below may reference images not displayed]

FINDINGS: There is no axillary, mediastinal, or hilar
lymphadenopathy.  Heart size is normal. Coronary artery
calcification is noted. No pericardial or pleural effusion.

Tiny right thyroid nodule noted.

Pleuroparenchymal scarring is noted in the lung apices.  6 mm
nodule identified in the right middle lobe on image 54 is unchanged
since 01/25/2010. There is some anteromedial wall airspace disease
in the right upper lobe, just anterior to the minor fissure.  This
is new since the prior chest CT. 8 mm right upper lobe pulmonary
nodule on image 28 has increased in size from 4 mm on the study of
01/25/2010.  9 mm right lower lobe pulmonary nodule on image 74 has
increased from 6 mm on the 01/25/2010 exam.  The 8 mm left lower
lobe pulmonary nodule on image 72 has increased from 5 mm
previously.  Left lower lobe suture line and adjacent scarring is
stable.

Bone windows reveal no worrisome lytic or sclerotic osseous
lesions.
IMPRESSION: Four pulmonary nodules are identified.  Three of the four have
progressed in the interval and one has remained relatively stable
over the last 18 months of imaging follow-up.  Slowly progressive
metastatic disease is a concern.

Tiny right thyroid nodule may be of no clinical significance in
this individual.  Attention on follow-up imaging is recommended.

CT ABDOMEN/PELVIS
FINDINGS: No focal abnormalities seen in the liver or spleen.  The
stomach, duodenum, and left adrenal gland are unremarkable.
Gallbladder is unremarkable.

15 mm hypervascular lesion in the body the pancreas has increased
since the prior CT of 01/25/2010, when it measured about 9 mm.  It
is also increased in size when comparing to the CT scan of
08/14/2010 when it measured 12 mm.

The two previously identified areas of hyperenhancement in the
ablation bed of the interpolar left kidney are again identified.
The more posterior of the two measures 10 x 12 mm today compared 8
x 14 mm previously.  The more anterior of the two measures 12 x 7
mm today compared to 12 x 6 mm previously.  There has been some
continued interval organization of the ablation bed.  The left
renal artery and vein are patent.

The right kidney and adrenal gland are surgically absent.

Bone windows reveal no worrisome lytic or sclerotic osseous
lesions.
IMPRESSION: To areas of nodular enhancement are identified in the left renal
ablation defect.  The patient has had interval repeat cryo
ablation, but the nodules are very similar in appearance to the
08/14/2010 exam which was prior to the second cryo ablation
therapy.  These appear to enhance more avidly than the background
renal cortex and also washout more quickly than the background
cortex.  These enhancement characteristics are similar to what was
seen in the renal lesion on the 01/25/2010 pretreatment exam.  As
such, the areas of nodular enhancement must be considered
suspicious for recurrent disease.

Continued further progression of the hypervascular lesion in the
body the pancreas.  The main imaging differential would be
metastatic deposit versus hypervascular islet cell tumor.

## 2012-09-14 ENCOUNTER — Other Ambulatory Visit: Payer: Self-pay | Admitting: *Deleted

## 2012-09-16 ENCOUNTER — Telehealth: Payer: Self-pay | Admitting: Oncology

## 2012-09-16 ENCOUNTER — Telehealth: Payer: Self-pay | Admitting: *Deleted

## 2012-09-16 NOTE — Telephone Encounter (Signed)
Caregiver rachel asking if CT needs to be done before patient's appt with dr Clelia Croft 10/05/12. CT-body was NOT done 07/20/12 d/t [place] another city, for a fractured hip. Note to dr Alver Fisher desk

## 2012-09-16 NOTE — Telephone Encounter (Signed)
Add to previous note. Desk nurse will let me know if pt needs ct prior to seeing FS. Per Genevie Cheshire pt did not have ct done in March due to being in rehab.

## 2012-09-16 NOTE — Telephone Encounter (Signed)
Per 5/13 pof s/w rachel davis re appt for 6/3. appt was already on schedule.

## 2012-10-05 ENCOUNTER — Ambulatory Visit (HOSPITAL_BASED_OUTPATIENT_CLINIC_OR_DEPARTMENT_OTHER): Payer: Medicare Other | Admitting: Oncology

## 2012-10-05 ENCOUNTER — Other Ambulatory Visit (HOSPITAL_BASED_OUTPATIENT_CLINIC_OR_DEPARTMENT_OTHER): Payer: Medicare Other | Admitting: Lab

## 2012-10-05 ENCOUNTER — Telehealth: Payer: Self-pay | Admitting: Oncology

## 2012-10-05 VITALS — BP 106/62 | HR 90 | Temp 96.8°F | Resp 18 | Ht 64.0 in | Wt 98.2 lb

## 2012-10-05 DIAGNOSIS — R634 Abnormal weight loss: Secondary | ICD-10-CM

## 2012-10-05 DIAGNOSIS — C649 Malignant neoplasm of unspecified kidney, except renal pelvis: Secondary | ICD-10-CM

## 2012-10-05 DIAGNOSIS — F329 Major depressive disorder, single episode, unspecified: Secondary | ICD-10-CM

## 2012-10-05 DIAGNOSIS — N289 Disorder of kidney and ureter, unspecified: Secondary | ICD-10-CM

## 2012-10-05 DIAGNOSIS — C7800 Secondary malignant neoplasm of unspecified lung: Secondary | ICD-10-CM

## 2012-10-05 LAB — COMPREHENSIVE METABOLIC PANEL (CC13)
ALT: 10 U/L (ref 0–55)
Albumin: 3.8 g/dL (ref 3.5–5.0)
BUN: 35 mg/dL — ABNORMAL HIGH (ref 7.0–26.0)
CO2: 21 mEq/L — ABNORMAL LOW (ref 22–29)
Calcium: 9.3 mg/dL (ref 8.4–10.4)
Chloride: 108 mEq/L — ABNORMAL HIGH (ref 98–107)
Creatinine: 1.5 mg/dL — ABNORMAL HIGH (ref 0.6–1.1)
Potassium: 4.1 mEq/L (ref 3.5–5.1)

## 2012-10-05 LAB — CBC WITH DIFFERENTIAL/PLATELET
BASO%: 0.8 % (ref 0.0–2.0)
Basophils Absolute: 0 10*3/uL (ref 0.0–0.1)
HCT: 36.7 % (ref 34.8–46.6)
HGB: 12.5 g/dL (ref 11.6–15.9)
MONO#: 0.4 10*3/uL (ref 0.1–0.9)
NEUT%: 46.7 % (ref 38.4–76.8)
RDW: 12.8 % (ref 11.2–14.5)
WBC: 4.7 10*3/uL (ref 3.9–10.3)
lymph#: 1.9 10*3/uL (ref 0.9–3.3)

## 2012-10-05 NOTE — Progress Notes (Signed)
Hematology and Oncology Follow Up Visit  [name] [id number] 1933/05/01 79 y.o. 10/05/2012 3:29 PM  CC: Gaynelle Arabian, M.D.  Cameron Proud, M.D.   Principle Diagnosis: This is a 79 year old female with renal cell carcinoma initially diagnosed with stage IIIB in August 2001.  She has developed a lung metastasis with stage IV disease in 2007. And recently found to have a pancreatic lesion.  Prior Therapy: 1.         She had underwent a radical nephrectomy in August 2001.  She had a stage IIIB disease. 2. The patient developed pulmonary metastases in July 2007 status post surgical resection.  She has not had any recurrent disease since that time. 3.         S/P Cryoablation of a left kidney mass done in 09/2010 Done by IR. 4.         S/P recent FNA of a pancreatic lesion by Dr. Christella Hartigan. Pathology indicate metastatic renal cell cancer in 04/2011.   Current therapy: Observation and surveillance.  Interim History: [name] presents today for a followup visit.  She has continued to do relatively fair.  She had not rally report any  pulmonary symptoms such as chest pain, shortness of breath, difficulty breathing. She had not reported any hemoptysis.  She had not reported any abdominal pain.  She had not reported any discomfort.  Her energy and performance status, although marginal is unchanged. She has a good quality of life at this point. She is eating better. She is able to swallow food without a problem.   She did have a fall and fractured her hip and required rehab but doing better now.    Medications: Current Outpatient Prescriptions  Medication Sig Dispense Refill  . buPROPion (WELLBUTRIN SR) 150 MG 12 hr tablet Take 1 tablet by mouth Twice daily.      . Calcium Carbonate-Vitamin D (CALCIUM + D PO) Take by mouth daily.        . DULoxetine (CYMBALTA) 30 MG capsule Take 30 mg by mouth daily.      . folic acid (FOLVITE) 400 MCG tablet Take 400 mcg by mouth daily.        Marland Kitchen  MEGACE ES 625 MG/5ML suspension Take 625 mg by mouth.      . megestrol (MEGACE) 40 MG/ML suspension Take 40 mg by mouth daily.      . Multiple Vitamin (MULTIVITAMIN) tablet Take 1 tablet by mouth daily.        . Omega-3 Fatty Acids (FISH OIL) 1200 MG CAPS Take by mouth daily.        Marland Kitchen omeprazole (PRILOSEC) 20 MG capsule Take 1 tablet by mouth daily.      Marland Kitchen PARoxetine (PAXIL) 10 MG tablet Take 1 tablet by mouth daily.      . vitamin B-12 (CYANOCOBALAMIN) 1000 MCG tablet Take 1,000 mcg by mouth daily.         No current facility-administered medications for this visit.    Allergies: No Known Allergies  Past Medical History, Surgical history, Social history, and Family History were reviewed and updated.  Review of Systems: Constitutional:  Negative for fever, chills, night sweats, anorexia, weight loss, pain. Cardiovascular: no chest pain or dyspnea on exertion Respiratory: no cough, shortness of breath, or wheezing Neurological: no TIA or stroke symptoms Dermatological: negative ENT: negative Skin: Negative. Gastrointestinal: no abdominal pain, change in bowel habits, or black or bloody stools Genito-Urinary: no dysuria, trouble voiding, or hematuria Hematological and Lymphatic:  negative Breast: negative for breast lumps Musculoskeletal: negative Remaining ROS negative. Physical Exam: Blood pressure 106/62, pulse 90, temperature 96.8 F (36 C), temperature source Oral, resp. rate 18, height 5\' 4"  (1.626 m), weight 98 lb 3.2 oz (44.543 kg). ECOG: 1 General appearance: Alert, awake and frail appearing women. Head: Normocephalic, without obvious abnormality, atraumatic Neck: no adenopathy, no carotid bruit, no JVD, supple, symmetrical, trachea midline and thyroid not enlarged, symmetric, no tenderness/mass/nodules Lymph nodes: Cervical, supraclavicular, and axillary nodes normal. Heart:regular rate and rhythm, S1, S2 normal, no murmur, click, rub or gallop Lung:chest clear, no  wheezing, rales, normal symmetric air entry Abdomin: soft, non-tender, without masses or organomegaly EXT:no erythema, induration, or nodules   Lab Results: Lab Results  Component Value Date   WBC 4.7 10/05/2012   HGB 12.5 10/05/2012   HCT 36.7 10/05/2012   MCV 91.5 10/05/2012   PLT 237 10/05/2012     Chemistry      Component Value Date/Time   NA 139 04/23/2012 0804   NA 138 10/21/2011 0818   NA 144 07/16/2011 0928   K 4.6 04/23/2012 0804   K 4.9 10/21/2011 0818   K 4.8* 07/16/2011 0928   CL 105 04/23/2012 0804   CL 106 10/21/2011 0818   CL 100 07/16/2011 0928   CO2 27 04/23/2012 0804   CO2 24 10/21/2011 0818   CO2 28 07/16/2011 0928   BUN 27.0* 04/23/2012 0804   BUN 32* 10/21/2011 0818   BUN 22 07/16/2011 0928   CREATININE 1.4* 04/23/2012 0804   CREATININE 1.25* 10/21/2011 0818   CREATININE 1.3* 07/16/2011 0928      Component Value Date/Time   CALCIUM 9.5 04/23/2012 0804   CALCIUM 9.1 10/21/2011 0818   CALCIUM 8.8 07/16/2011 0928   ALKPHOS 150 04/23/2012 0804   ALKPHOS 81 10/21/2011 0818   ALKPHOS 107* 07/16/2011 0928   AST 23 04/23/2012 0804   AST 16 10/21/2011 0818   AST 29 07/16/2011 0928   ALT 9 04/23/2012 0804   ALT <8 10/21/2011 0818   BILITOT 0.55 04/23/2012 0804   BILITOT 0.4 10/21/2011 0818   BILITOT 0.70 07/16/2011 0928       Impression and Plan:  This is a pleasant 79 year old female with the following issues: 1. Stage IV renal cell carcinoma, clear cell histology resected initially in 2001.  She had a metastasectomy in 2007 for a lung lesion.  She is also S/P recent Cryoablation of a Left renal mass. Now she has documented pancreatic lesion biopsy proven to be renal in etiology. CT scans from 01/2012 showed stable disease for the most part. Systemic therapy might be an option for her in the future (TKI of VEGEF of MTOR inhibitors) these can be reserved if she develops wide spread disease. I still think the benefits do not justify the risks at this time. Observation might be the  best choice at this time. Will arrange follow up in 1 months and repeat CT scan then.  2. Depression.  She is doing better from that 3. Weight loss.  I encouraged her to go back to taking ensure which she will try.  4.         Left Kidney mass: I doubt any local therapy can be done any more. Continued observation for now. 5.         Falls and unsteadiness: I encouraged her to use her cane more often at this time.   [facility], MD 6/3/20143:29 PM

## 2012-11-01 ENCOUNTER — Ambulatory Visit (HOSPITAL_COMMUNITY)
Admission: RE | Admit: 2012-11-01 | Discharge: 2012-11-01 | Disposition: A | Discharge status: Home / Self Care | Payer: Medicare Other | Source: Ambulatory Visit | Attending: Oncology | Admitting: Oncology

## 2012-11-01 ENCOUNTER — Other Ambulatory Visit (HOSPITAL_BASED_OUTPATIENT_CLINIC_OR_DEPARTMENT_OTHER): Payer: Medicare Other | Admitting: Lab

## 2012-11-01 ENCOUNTER — Encounter (HOSPITAL_COMMUNITY): Payer: Self-pay

## 2012-11-01 DIAGNOSIS — N289 Disorder of kidney and ureter, unspecified: Secondary | ICD-10-CM | POA: Insufficient documentation

## 2012-11-01 DIAGNOSIS — C649 Malignant neoplasm of unspecified kidney, except renal pelvis: Secondary | ICD-10-CM

## 2012-11-01 DIAGNOSIS — Z905 Acquired absence of kidney: Secondary | ICD-10-CM | POA: Insufficient documentation

## 2012-11-01 DIAGNOSIS — R918 Other nonspecific abnormal finding of lung field: Secondary | ICD-10-CM | POA: Insufficient documentation

## 2012-11-01 LAB — COMPREHENSIVE METABOLIC PANEL (CC13)
ALT: 10 U/L (ref 0–55)
AST: 20 U/L (ref 5–34)
Albumin: 3.8 g/dL (ref 3.5–5.0)
Alkaline Phosphatase: 95 U/L (ref 40–150)
Calcium: 9.5 mg/dL (ref 8.4–10.4)
Chloride: 107 mEq/L (ref 98–109)
Potassium: 4.3 mEq/L (ref 3.5–5.1)
Sodium: 137 mEq/L (ref 136–145)
Total Protein: 7 g/dL (ref 6.4–8.3)

## 2012-11-01 LAB — CBC WITH DIFFERENTIAL/PLATELET
BASO%: 3.8 % — ABNORMAL HIGH (ref 0.0–2.0)
EOS%: 5.6 % (ref 0.0–7.0)
HCT: 35.7 % (ref 34.8–46.6)
MCH: 30.8 pg (ref 25.1–34.0)
MCHC: 34 g/dL (ref 31.5–36.0)
MONO#: 0.4 10*3/uL (ref 0.1–0.9)
NEUT%: 38.7 % (ref 38.4–76.8)
RDW: 12.7 % (ref 11.2–14.5)
WBC: 4.9 10*3/uL (ref 3.9–10.3)
lymph#: 2.1 10*3/uL (ref 0.9–3.3)

## 2012-11-03 ENCOUNTER — Telehealth: Payer: Self-pay | Admitting: Oncology

## 2012-11-03 ENCOUNTER — Ambulatory Visit (HOSPITAL_BASED_OUTPATIENT_CLINIC_OR_DEPARTMENT_OTHER): Payer: Medicare Other | Admitting: Oncology

## 2012-11-03 VITALS — BP 105/92 | HR 92 | Temp 98.2°F | Resp 20 | Ht 64.0 in | Wt 99.1 lb

## 2012-11-03 DIAGNOSIS — C649 Malignant neoplasm of unspecified kidney, except renal pelvis: Secondary | ICD-10-CM

## 2012-11-03 NOTE — Telephone Encounter (Signed)
gv pt appt schedule for October.  °

## 2012-11-03 NOTE — Progress Notes (Signed)
Hematology and Oncology Follow Up Visit  [name] [id number] 1933-03-23 79 y.o. 11/03/2012 9:08 AM  CC: Gaynelle Arabian, M.D.  Cameron Proud, M.D.   Principle Diagnosis: This is a 79 year old female with renal cell carcinoma initially diagnosed with stage IIIB in August 2001.  She has developed a lung metastasis with stage IV disease in 2007. And recently found to have a pancreatic lesion.  Prior Therapy: 1.         She had underwent a radical nephrectomy in August 2001.  She had a stage IIIB disease. 2. The patient developed pulmonary metastases in July 2007 status post surgical resection.  She has not had any recurrent disease since that time. 3.         S/P Cryoablation of a left kidney mass done in 09/2010 Done by IR. 4.         S/P recent FNA of a pancreatic lesion by Dr. Christella Hartigan. Pathology indicate metastatic renal cell cancer in 04/2011.   Current therapy: Observation and surveillance.  Interim History: [name] presents today for a followup visit.  She has continued to do relatively fair.  She had not rally report any  pulmonary symptoms such as chest pain, shortness of breath, difficulty breathing. She had not reported any hemoptysis.  She had not reported any abdominal pain.  She had not reported any discomfort.  Her energy and performance status, although marginal is unchanged. She has a good quality of life at this point. She is eating better. She is able to swallow food without a problem and have gained one lb since last visit. No falls reported.   Medications: Current Outpatient Prescriptions  Medication Sig Dispense Refill  . buPROPion (WELLBUTRIN SR) 150 MG 12 hr tablet Take 1 tablet by mouth Twice daily.      . Calcium Carbonate-Vitamin D (CALCIUM + D PO) Take by mouth daily.        . DULoxetine (CYMBALTA) 30 MG capsule Take 30 mg by mouth daily.      Marland Kitchen MEGACE ES 625 MG/5ML suspension Take 625 mg by mouth.      . Multiple Vitamin (MULTIVITAMIN) tablet  Take 1 tablet by mouth daily.        . Omega-3 Fatty Acids (FISH OIL) 1200 MG CAPS Take by mouth daily.        . megestrol (MEGACE) 40 MG/ML suspension Take 40 mg by mouth daily.       No current facility-administered medications for this visit.    Allergies: No Known Allergies  Past Medical History, Surgical history, Social history, and Family History were reviewed and updated.  Review of Systems: Constitutional:  Negative for fever, chills, night sweats, anorexia, weight loss, pain. Cardiovascular: no chest pain or dyspnea on exertion Respiratory: no cough, shortness of breath, or wheezing Neurological: no TIA or stroke symptoms Dermatological: negative ENT: negative Skin: Negative. Gastrointestinal: no abdominal pain, change in bowel habits, or black or bloody stools Genito-Urinary: no dysuria, trouble voiding, or hematuria Hematological and Lymphatic: negative Breast: negative for breast lumps Musculoskeletal: negative Remaining ROS negative. Physical Exam: Blood pressure 105/92, pulse 92, temperature 98.2 F (36.8 C), temperature source Oral, resp. rate 20, height 5\' 4"  (1.626 m), weight 99 lb 1.6 oz (44.951 kg). ECOG: 1 General appearance: Alert, awake and frail appearing women. Head: Normocephalic, without obvious abnormality, atraumatic Neck: no adenopathy, no carotid bruit, no JVD, supple, symmetrical, trachea midline and thyroid not enlarged, symmetric, no tenderness/mass/nodules Lymph nodes: Cervical, supraclavicular, and axillary  nodes normal. Heart:regular rate and rhythm, S1, S2 normal, no murmur, click, rub or gallop Lung:chest clear, no wheezing, rales, normal symmetric air entry Abdomin: soft, non-tender, without masses or organomegaly EXT:no erythema, induration, or nodules   Lab Results: Lab Results  Component Value Date   WBC 4.9 11/01/2012   HGB 12.1 11/01/2012   HCT 35.7 11/01/2012   MCV 90.6 11/01/2012   PLT 251 11/01/2012     Chemistry       Component Value Date/Time   NA 137 11/01/2012 0913   NA 138 10/21/2011 0818   NA 144 07/16/2011 0928   K 4.3 11/01/2012 0913   K 4.9 10/21/2011 0818   K 4.8* 07/16/2011 0928   CL 108* 10/05/2012 1454   CL 106 10/21/2011 0818   CL 100 07/16/2011 0928   CO2 23 11/01/2012 0913   CO2 24 10/21/2011 0818   CO2 28 07/16/2011 0928   BUN 27.2* 11/01/2012 0913   BUN 32* 10/21/2011 0818   BUN 22 07/16/2011 0928   CREATININE 1.4* 11/01/2012 0913   CREATININE 1.25* 10/21/2011 0818   CREATININE 1.3* 07/16/2011 0928      Component Value Date/Time   CALCIUM 9.5 11/01/2012 0913   CALCIUM 9.1 10/21/2011 0818   CALCIUM 8.8 07/16/2011 0928   ALKPHOS 95 11/01/2012 0913   ALKPHOS 81 10/21/2011 0818   ALKPHOS 107* 07/16/2011 0928   AST 20 11/01/2012 0913   AST 16 10/21/2011 0818   AST 29 07/16/2011 0928   ALT 10 11/01/2012 0913   ALT <8 10/21/2011 0818   BILITOT 0.60 11/01/2012 0913   BILITOT 0.4 10/21/2011 0818   BILITOT 0.70 07/16/2011 0928     CT CHEST, ABDOMEN AND PELVIS WITHOUT CONTRAST  Technique: Multidetector CT imaging of the chest, abdomen and  pelvis was performed following the standard protocol without IV  contrast.  Comparison: CT 01/20/2012  CT CHEST  Findings: Again demonstrated right pulmonary nodules. These are  not significantly enlarged compared to prior. Nodules include a  right middle lobe nodule measuring 11 mm (image 18, series 4)  compared to 9 mm on prior. 6 mm right lower lobe pulmonary nodule  compared to 6 mm on prior (image 41). 10 mm right lower lobe  nodule (image 45) compared to 9 mm on prior. No new nodules. The  consolidative pattern in the inferior right middle lobe continues  to improve. There is a 10 mm nodule the left lung base (image 43)  compared to 9 mm on prior. No new nodules.  No evidence of mediastinal adenopathy on this noncontrast exam. No  pericardial fluid. No axillary supraclavicular adenopathy  IMPRESSION:  1. Stable pulmonary nodules within the left and right  lungs.  2. Improved consolidation in the inferior right middle lobe.  CT ABDOMEN AND PELVIS  Findings: No focal hepatic lesion on this noncontrast exam. Right  nephrectomy bed is stable. The pancreas, spleen, left adrenal  gland are stable. Low density lesion left kidney measuring 15 mm x  15 mm but increased from 15 x 14 mm on prior. The stomach, small  bowel, and colon unremarkable.  Abdominal aorta is normal caliber. No retroperitoneal or  periportal lymphadenopathy.  No free fluid the pelvis. The bladder and uterus are normal. No  pelvic lymphadenopathy. Review of bone windows demonstrates no  aggressive osseous lesions.  IMPRESSION:  1. No evidence of recurrence in the right nephrectomy bed.  2. No evidence of metastasis on this noncontrast exam.  3. Stable low density  lesion in the left kidney at cryoablation  site.    Impression and Plan:  This is a pleasant 79 year old female with the following issues: 1. Stage IV renal cell carcinoma, clear cell histology resected initially in 2001.  She had a metastasectomy in 2007 for a lung lesion.  She is also S/P recent Cryoablation of a Left renal mass. Now she has documented pancreatic lesion biopsy proven to be renal in etiology. CT scans from 11/01/2012 showed stable disease for the most part. Systemic therapy might be an option for her in the future (TKI of VEGEF of MTOR inhibitors) these can be reserved if she develops wide spread disease. I still think the benefits do not justify the risks at this time. Observation might be the best choice at this time. Will arrange follow up in 3 months and repeat CT scan in 6 months.  2. Depression.  She is doing better from that 3. Weight loss.  I encouraged her to go back to taking ensure which she will try. She gained one lb from last visit. 4.         Left Kidney mass: I doubt any local therapy can be done any more. Continued observation for now. 5.         Falls and unsteadiness: I encouraged her  to use her cane more often at this time.   [facility] MD 7/2/20149:08 AM

## 2013-02-02 ENCOUNTER — Ambulatory Visit: Payer: Medicare Other | Admitting: Oncology

## 2013-02-02 ENCOUNTER — Other Ambulatory Visit: Payer: Medicare Other

## 2013-02-04 ENCOUNTER — Telehealth: Payer: Self-pay | Admitting: Oncology

## 2013-02-04 NOTE — Telephone Encounter (Signed)
gv and printed appt sched and avs for pt....pt needed to r/s missed appt done

## 2013-03-15 ENCOUNTER — Other Ambulatory Visit (HOSPITAL_BASED_OUTPATIENT_CLINIC_OR_DEPARTMENT_OTHER): Payer: Medicare Other | Admitting: Lab

## 2013-03-15 ENCOUNTER — Telehealth: Payer: Self-pay | Admitting: Oncology

## 2013-03-15 ENCOUNTER — Ambulatory Visit (HOSPITAL_BASED_OUTPATIENT_CLINIC_OR_DEPARTMENT_OTHER): Payer: Medicare Other | Admitting: Oncology

## 2013-03-15 DIAGNOSIS — C50919 Malignant neoplasm of unspecified site of unspecified female breast: Secondary | ICD-10-CM

## 2013-03-15 DIAGNOSIS — C7800 Secondary malignant neoplasm of unspecified lung: Secondary | ICD-10-CM

## 2013-03-15 DIAGNOSIS — F329 Major depressive disorder, single episode, unspecified: Secondary | ICD-10-CM

## 2013-03-15 DIAGNOSIS — C649 Malignant neoplasm of unspecified kidney, except renal pelvis: Secondary | ICD-10-CM

## 2013-03-15 LAB — CBC WITH DIFFERENTIAL/PLATELET
BASO%: 1.8 % (ref 0.0–2.0)
Basophils Absolute: 0.1 10*3/uL (ref 0.0–0.1)
EOS%: 4.2 % (ref 0.0–7.0)
HGB: 10.9 g/dL — ABNORMAL LOW (ref 11.6–15.9)
MCH: 31.1 pg (ref 25.1–34.0)
MCHC: 33.1 g/dL (ref 31.5–36.0)
MCV: 93.8 fL (ref 79.5–101.0)
MONO%: 10.4 % (ref 0.0–14.0)
RDW: 13.2 % (ref 11.2–14.5)
lymph#: 1.2 10*3/uL (ref 0.9–3.3)

## 2013-03-15 LAB — COMPREHENSIVE METABOLIC PANEL (CC13)
ALT: 9 U/L (ref 0–55)
AST: 23 U/L (ref 5–34)
Alkaline Phosphatase: 138 U/L (ref 40–150)
BUN: 25.5 mg/dL (ref 7.0–26.0)
Chloride: 106 mEq/L (ref 98–109)
Creatinine: 1 mg/dL (ref 0.6–1.1)
Potassium: 4.7 mEq/L (ref 3.5–5.1)

## 2013-03-15 NOTE — Telephone Encounter (Signed)
gv and printed appt sched and avs for pt for March 2015....gv pt barium °

## 2013-03-15 NOTE — Progress Notes (Signed)
Hematology and Oncology Follow Up Visit  [name] [id number] [birth year] 79 y.o. 03/15/2013 11:36 AM  CC: Gaynelle Arabian, M.D.  Cameron Proud, M.D.   Principle Diagnosis: This is a 79 year old female with renal cell carcinoma initially diagnosed with stage IIIB in August 2001.  She has developed a lung metastasis with stage IV disease in 2007. And recently found to have a pancreatic lesion.  Prior Therapy: 1.         She had underwent a radical nephrectomy in August 2001.  She had a stage IIIB disease. 2. The patient developed pulmonary metastases in July 2007 status post surgical resection.  She has not had any recurrent disease since that time. 3.         S/P Cryoablation of a left kidney mass done in 09/2010 Done by IR. 4.         S/P recent FNA of a pancreatic lesion by Dr. Christella Hartigan. Pathology indicate metastatic renal cell cancer in 04/2011.   Current therapy: Observation and surveillance.  Interim History: Ms. Paulick presents today for a followup visit.  She has continued to do relatively fair.  She had not rally report any  pulmonary symptoms such as chest pain, shortness of breath, difficulty breathing. She had not reported any hemoptysis.  She had not reported any abdominal pain.  She had not reported any discomfort.  Her energy and performance status, although marginal is unchanged. She has a good quality of life at this point. She is eating better. She is able to swallow food without a problem and have gained one lb since last visit. No falls reported. She is not reporting any gastrointestinal symptoms. Her quality of life is about the same.  Medications: Current Outpatient Prescriptions  Medication Sig Dispense Refill  . buPROPion (WELLBUTRIN SR) 150 MG 12 hr tablet Take 1 tablet by mouth Twice daily.      . Calcium Carbonate-Vitamin D (CALCIUM + D PO) Take by mouth daily.        . DULoxetine (CYMBALTA) 30 MG capsule Take 30 mg by mouth daily.      . Multiple  Vitamin (MULTIVITAMIN) tablet Take 1 tablet by mouth daily.        . Omega-3 Fatty Acids (FISH OIL) 1200 MG CAPS Take by mouth daily.         No current facility-administered medications for this visit.    Allergies: No Known Allergies  Past Medical History, Surgical history, Social history, and Family History were reviewed and updated.  Review of Systems:  Remaining ROS negative. Physical Exam: There were no vitals taken for this visit. ECOG: 1 General appearance: Alert, awake and frail appearing women. Head: Normocephalic, without obvious abnormality, atraumatic Neck: no adenopathy, no carotid bruit, no JVD, supple, symmetrical, trachea midline and thyroid not enlarged, symmetric, no tenderness/mass/nodules Lymph nodes: Cervical, supraclavicular, and axillary nodes normal. Heart:regular rate and rhythm, S1, S2 normal, no murmur, click, rub or gallop Lung:chest clear, no wheezing, rales, normal symmetric air entry Abdomin: soft, non-tender, without masses or organomegaly EXT:no erythema, induration, or nodules   Lab Results: Lab Results  Component Value Date   WBC 4.6 03/15/2013   HGB 10.9* 03/15/2013   HCT 33.0* 03/15/2013   MCV 93.8 03/15/2013   PLT 246 03/15/2013     Chemistry      Component Value Date/Time   NA 137 11/01/2012 0913   NA 138 10/21/2011 0818   NA 144 07/16/2011 0928   K 4.3 11/01/2012 0913  K 4.9 10/21/2011 0818   K 4.8* 07/16/2011 0928   CL 108* 10/05/2012 1454   CL 106 10/21/2011 0818   CL 100 07/16/2011 0928   CO2 23 11/01/2012 0913   CO2 24 10/21/2011 0818   CO2 28 07/16/2011 0928   BUN 27.2* 11/01/2012 0913   BUN 32* 10/21/2011 0818   BUN 22 07/16/2011 0928   CREATININE 1.4* 11/01/2012 0913   CREATININE 1.25* 10/21/2011 0818   CREATININE 1.3* 07/16/2011 0928      Component Value Date/Time   CALCIUM 9.5 11/01/2012 0913   CALCIUM 9.1 10/21/2011 0818   CALCIUM 8.8 07/16/2011 0928   ALKPHOS 95 11/01/2012 0913   ALKPHOS 81 10/21/2011 0818   ALKPHOS 107*  07/16/2011 0928   AST 20 11/01/2012 0913   AST 16 10/21/2011 0818   AST 29 07/16/2011 0928   ALT 10 11/01/2012 0913   ALT <8 10/21/2011 0818   ALT 17 07/16/2011 0928   BILITOT 0.60 11/01/2012 0913   BILITOT 0.4 10/21/2011 0818   BILITOT 0.70 07/16/2011 0928       Impression and Plan:  This is a pleasant 79 year old female with the following issues: 1. Stage IV renal cell carcinoma, clear cell histology resected initially in 2001.  She had a metastasectomy in 2007 for a lung lesion.  She is also S/P Cryoablation of a Left renal mass. She also has documented pancreatic lesion biopsy proven to be renal in etiology. CT scans from 11/01/2012 showed stable disease for the most part. Systemic therapy might be an option for her in the future (TKI of VEGEF of MTOR inhibitors) these can be reserved if she develops wide spread disease. I still think the benefits do not justify the risks at this time. Observation might be the best choice at this time. Will arrange follow up in 3 to 4months and repeat CT scan  at that time. 2. Depression.  She is doing better from that. 3. Weight loss.  I encouraged her to go back to taking ensure which she will try. She gained one lb from last visit. 4.         Left Kidney mass: I doubt any local therapy can be done any more. Continued observation for now. 5.         Falls and unsteadiness: I encouraged her to use her cane more often at this time.   [facility] [place], MD 11/11/201411:36 AM

## 2013-07-12 ENCOUNTER — Other Ambulatory Visit: Payer: Self-pay | Admitting: *Deleted

## 2013-07-12 NOTE — Progress Notes (Signed)
pof to schedulers for lab appt 11:30 on 07/14/13 prior to CT scan @ 12:30. Pre-cert was not approved 07/12/13.

## 2013-07-13 ENCOUNTER — Telehealth: Payer: Self-pay | Admitting: Oncology

## 2013-07-13 ENCOUNTER — Ambulatory Visit (HOSPITAL_COMMUNITY): Admission: RE | Admit: 2013-07-13 | Payer: Medicare HMO | Source: Ambulatory Visit

## 2013-07-13 ENCOUNTER — Other Ambulatory Visit: Payer: Medicare Other

## 2013-07-13 NOTE — Telephone Encounter (Signed)
lvm for pt regarding to lab b4 3.12.15 ct

## 2013-07-14 ENCOUNTER — Other Ambulatory Visit: Payer: Self-pay | Admitting: *Deleted

## 2013-07-14 ENCOUNTER — Other Ambulatory Visit: Payer: Medicare Other

## 2013-07-14 ENCOUNTER — Ambulatory Visit (HOSPITAL_COMMUNITY): Payer: Medicare HMO

## 2013-07-14 ENCOUNTER — Other Ambulatory Visit: Payer: Self-pay | Admitting: Oncology

## 2013-07-14 DIAGNOSIS — C649 Malignant neoplasm of unspecified kidney, except renal pelvis: Secondary | ICD-10-CM

## 2013-07-18 ENCOUNTER — Telehealth: Payer: Self-pay | Admitting: Oncology

## 2013-07-18 NOTE — Telephone Encounter (Signed)
lmonvm for pt re lab appt for 3/17 @ 11am prior to ct. also confirmed 3/18 f/u.

## 2013-07-19 ENCOUNTER — Ambulatory Visit (HOSPITAL_COMMUNITY)
Admission: RE | Admit: 2013-07-19 | Discharge: 2013-07-19 | Disposition: A | Discharge status: Home / Self Care | Payer: Medicare HMO | Source: Ambulatory Visit | Attending: Oncology | Admitting: Oncology

## 2013-07-19 ENCOUNTER — Encounter (HOSPITAL_COMMUNITY): Payer: Self-pay

## 2013-07-19 ENCOUNTER — Telehealth: Payer: Self-pay | Admitting: Medical Oncology

## 2013-07-19 ENCOUNTER — Other Ambulatory Visit (HOSPITAL_BASED_OUTPATIENT_CLINIC_OR_DEPARTMENT_OTHER): Payer: Medicare HMO

## 2013-07-19 DIAGNOSIS — C649 Malignant neoplasm of unspecified kidney, except renal pelvis: Secondary | ICD-10-CM

## 2013-07-19 DIAGNOSIS — Z905 Acquired absence of kidney: Secondary | ICD-10-CM | POA: Insufficient documentation

## 2013-07-19 DIAGNOSIS — R918 Other nonspecific abnormal finding of lung field: Secondary | ICD-10-CM | POA: Insufficient documentation

## 2013-07-19 LAB — CBC WITH DIFFERENTIAL/PLATELET
BASO%: 1.7 % (ref 0.0–2.0)
BASOS ABS: 0.1 10*3/uL (ref 0.0–0.1)
EOS%: 5.6 % (ref 0.0–7.0)
Eosinophils Absolute: 0.3 10*3/uL (ref 0.0–0.5)
HCT: 35.5 % (ref 34.8–46.6)
HGB: 11.6 g/dL (ref 11.6–15.9)
LYMPH%: 19.7 % (ref 14.0–49.7)
MCH: 30.8 pg (ref 25.1–34.0)
MCHC: 32.8 g/dL (ref 31.5–36.0)
MCV: 93.8 fL (ref 79.5–101.0)
MONO#: 0.4 10*3/uL (ref 0.1–0.9)
MONO%: 8.7 % (ref 0.0–14.0)
NEUT#: 3.2 10*3/uL (ref 1.5–6.5)
NEUT%: 64.3 % (ref 38.4–76.8)
Platelets: 233 10*3/uL (ref 145–400)
RBC: 3.78 10*6/uL (ref 3.70–5.45)
RDW: 13.5 % (ref 11.2–14.5)
WBC: 5 10*3/uL (ref 3.9–10.3)
lymph#: 1 10*3/uL (ref 0.9–3.3)

## 2013-07-19 LAB — COMPREHENSIVE METABOLIC PANEL (CC13)
ALBUMIN: 3.9 g/dL (ref 3.5–5.0)
ALK PHOS: 95 U/L (ref 40–150)
ALT: 9 U/L (ref 0–55)
AST: 22 U/L (ref 5–34)
Anion Gap: 9 mEq/L (ref 3–11)
BILIRUBIN TOTAL: 0.51 mg/dL (ref 0.20–1.20)
BUN: 25.1 mg/dL (ref 7.0–26.0)
CO2: 25 mEq/L (ref 22–29)
Calcium: 9.6 mg/dL (ref 8.4–10.4)
Chloride: 106 mEq/L (ref 98–109)
Creatinine: 1.2 mg/dL — ABNORMAL HIGH (ref 0.6–1.1)
Glucose: 91 mg/dl (ref 70–140)
POTASSIUM: 4.7 meq/L (ref 3.5–5.1)
SODIUM: 140 meq/L (ref 136–145)
Total Protein: 6.9 g/dL (ref 6.4–8.3)

## 2013-07-19 NOTE — Telephone Encounter (Signed)
Sister called to confirm tomorrow's appt with Dr. Alen Blew @ 0900.

## 2013-07-20 ENCOUNTER — Ambulatory Visit (HOSPITAL_BASED_OUTPATIENT_CLINIC_OR_DEPARTMENT_OTHER): Payer: Medicare HMO | Admitting: Oncology

## 2013-07-20 ENCOUNTER — Telehealth: Payer: Self-pay | Admitting: Oncology

## 2013-07-20 ENCOUNTER — Encounter: Payer: Self-pay | Admitting: Oncology

## 2013-07-20 VITALS — BP 120/52 | HR 76 | Temp 97.5°F | Resp 18 | Ht 64.0 in | Wt 97.9 lb

## 2013-07-20 DIAGNOSIS — C649 Malignant neoplasm of unspecified kidney, except renal pelvis: Secondary | ICD-10-CM

## 2013-07-20 DIAGNOSIS — N289 Disorder of kidney and ureter, unspecified: Secondary | ICD-10-CM

## 2013-07-20 NOTE — Progress Notes (Signed)
Hematology and Oncology Follow Up Visit  [name] [id number] 1932-10-19 80 y.o. 07/20/2013 9:14 AM  CC: [name], M.D.  [name], M.D.   Principle Diagnosis: This is a 80 year old female with renal cell carcinoma initially diagnosed with stage IIIB in August 2001.  She has developed a lung metastasis with stage IV disease in 2007. And recently found to have a pancreatic lesion.  Prior Therapy: 1.         She had underwent a radical nephrectomy in August 2001.  She had a stage IIIB disease. 2. The patient developed pulmonary metastases in July 2007 status post surgical resection.  She has not had any recurrent disease since that time. 3.         S/P Cryoablation of a left kidney mass done in 09/2010 Done by IR. 4.         S/P recent FNA of a pancreatic lesion by Dr. Ardis Hughs. Pathology indicate metastatic renal cell cancer in 04/2011.   Current therapy: Observation and surveillance.  Interim History: [name] presents today for a followup visit.  She has continued to to be in stable health.  She had not rally report any  pulmonary symptoms such as chest pain, shortness of breath, difficulty breathing. She had not reported any hemoptysis.  She had not reported any abdominal pain.  She had not reported any discomfort.  Her energy and performance status, although marginal is unchanged. She has a good quality of life at this point. She is eating better. She is able to swallow food without a problem and have gained few lbs since last visit. No falls reported. She is not reporting any gastrointestinal symptoms. She continues to be a rather frail and have problems with memory at times. But despite that, she continues to live independently although her sisters are very close by and the check on her periodically.  Medications: Current Outpatient Prescriptions  Medication Sig Dispense Refill  . buPROPion (WELLBUTRIN SR) 150 MG 12 hr tablet Take 1 tablet by mouth Twice  daily.      . Calcium Carbonate-Vitamin D (CALCIUM + D PO) Take by mouth daily.        . DULoxetine (CYMBALTA) 30 MG capsule Take 30 mg by mouth daily.      . Multiple Vitamin (MULTIVITAMIN) tablet Take 1 tablet by mouth daily.        . Omega-3 Fatty Acids (FISH OIL) 1200 MG CAPS Take by mouth daily.         No current facility-administered medications for this visit.    Allergies: No Known Allergies  Past Medical History, Surgical history, Social history, and Family History were reviewed and updated.  Review of Systems:  Remaining ROS negative. Physical Exam: Blood pressure 120/52, pulse 76, temperature 97.5 F (36.4 C), temperature source Oral, resp. rate 18, height 5\' 4"  (1.626 m), weight 97 lb 14.4 oz (44.407 kg), SpO2 100.00%. ECOG: 1 General appearance: Alert, awake and frail appearing women. Head: Normocephalic, without obvious abnormality, atraumatic Neck: no adenopathy, no carotid bruit, no JVD, supple, symmetrical, trachea midline and thyroid not enlarged, symmetric, no tenderness/mass/nodules Lymph nodes: Cervical, supraclavicular, and axillary nodes normal. Heart:regular rate and rhythm, S1, S2 normal, no murmur, click, rub or gallop Lung:chest clear, no wheezing, rales, normal symmetric air entry Abdomin: soft, non-tender, without masses or organomegaly EXT:no erythema, induration, or nodules   Lab Results: Lab Results  Component Value Date   WBC 5.0 07/19/2013   HGB 11.6 07/19/2013  HCT 35.5 07/19/2013   MCV 93.8 07/19/2013   PLT 233 07/19/2013     Chemistry      Component Value Date/Time   NA 140 07/19/2013 1052   NA 138 10/21/2011 0818   NA 144 07/16/2011 0928   K 4.7 07/19/2013 1052   K 4.9 10/21/2011 0818   K 4.8* 07/16/2011 0928   CL 108* 10/05/2012 1454   CL 106 10/21/2011 0818   CL 100 07/16/2011 0928   CO2 25 07/19/2013 1052   CO2 24 10/21/2011 0818   CO2 28 07/16/2011 0928   BUN 25.1 07/19/2013 1052   BUN 32* 10/21/2011 0818   BUN 22 07/16/2011 0928    CREATININE 1.2* 07/19/2013 1052   CREATININE 1.25* 10/21/2011 0818   CREATININE 1.3* 07/16/2011 0928      Component Value Date/Time   CALCIUM 9.6 07/19/2013 1052   CALCIUM 9.1 10/21/2011 0818   CALCIUM 8.8 07/16/2011 0928   ALKPHOS 95 07/19/2013 1052   ALKPHOS 81 10/21/2011 0818   ALKPHOS 107* 07/16/2011 0928   AST 22 07/19/2013 1052   AST 16 10/21/2011 0818   AST 29 07/16/2011 0928   ALT 9 07/19/2013 1052   ALT <8 10/21/2011 0818   ALT 17 07/16/2011 0928   BILITOT 0.51 07/19/2013 1052   BILITOT 0.4 10/21/2011 0818   BILITOT 0.70 07/16/2011 0928      EXAM:  CT CHEST, ABDOMEN AND PELVIS WITHOUT CONTRAST  TECHNIQUE:  Multidetector CT imaging of the chest, abdomen and pelvis was  performed following the standard protocol without IV contrast.  COMPARISON: CT ABD/PELV WO CM dated 11/01/2012  FINDINGS:  CT CHEST FINDINGS  Again demonstrated bilateral rounded pulmonary nodules consistent  with widespread pulmonary metastasis. One of the nodules in the  right lower lobe is increased slightly in size measuring 13 mm x 10  mm compared to 10 mm x 9 mm on prior. A right upper lobe nodule is  slightly thickened compared to prior measuring 9 mm in short  dimension compared to 8 mm on prior (image 21 series 4).  The majority of nodules are unchanged. 10 mm right lower lobe nodule  (image 40) compares to 10 mm on prior. 10 mm left lower nodule  (image 45) unchanged. No new pulmonary nodules present.  No evidence of axillary or supraclavicular lymphadenopathy. No hilar  or mediastinal lymphadenopathy. Ascending aorta is dilated to 38 mm.  No pericardial fluid. Coronary calcifications are present.  CT ABDOMEN AND PELVIS FINDINGS  No focal hepatic lesion on this noncontrast exam. The gallbladder,  pancreas, spleen, left adrenal gland left kidney are unchanged.  Stable cryoablation site in the lateral aspect of the left kidney  without new nodularity. Cryoablation site measures 16 x 12 mm  compared to 16 x  15 mm prior (image 65, series 2). .  Patient status post right nephrectomy. No nodularity within  nephrectomy bed.  Abdominal aorta is normal caliber. No retroperitoneal periportal  lymphadenopathy. No mesenteric disease.  Stomach, small bowel, and colon are normal. Moderate volume stool  throughout the colon.  Abdominal aorta  The bladder and uterus are normal. No pelvic lymphadenopathy. Remote  fracture of the left inferior pubic ramus. Bilateral hip fixation is  are noted. No aggressive osseous lesion.  IMPRESSION:  1. Slight increase in thickness of at least 2 of the pulmonary  nodules. This is concerning for mild progression of pulmonary  metastasis.  2. Stable right nephrectomy bed.  3. Stable cryoablation site in the left  kidney.   Impression and Plan:  This is a pleasant 80 year old female with the following issues: 1. Stage IV renal cell carcinoma, clear cell histology resected initially in 2001.  She had a metastasectomy in 2007 for a lung lesion.  She is also S/P Cryoablation of a Left renal mass. She also has documented pancreatic lesion biopsy proven to be renal in etiology. CT scans from 07/19/2013 was reviewed today and showed stable disease for the most part. Systemic therapy is not indicated at this time given the slow growth and stability of her cancer. She is quite frail and I worry about using tyrosine kinase inhibitors which might precipitate more toxicities and worsening quality of life. Observation might be the best choice at this time. Will arrange follow up in 6 months and a repeat imaging studies in 12 moths. 2. Depression.  She is doing better from that. 3. Weight loss.  She is doing better from that standpoint the 4.         Left Kidney mass: I doubt any local therapy can be done any more. Continued observation for now. 5.         Falls and unsteadiness: She had less problems with this at this point.  [facility], MD 3/18/20159:14 AM

## 2013-07-20 NOTE — Telephone Encounter (Signed)
Gave pt appt for lab and Md for septermber 2015

## 2014-01-20 ENCOUNTER — Ambulatory Visit (HOSPITAL_BASED_OUTPATIENT_CLINIC_OR_DEPARTMENT_OTHER): Payer: Medicare HMO | Admitting: Oncology

## 2014-01-20 ENCOUNTER — Other Ambulatory Visit (HOSPITAL_BASED_OUTPATIENT_CLINIC_OR_DEPARTMENT_OTHER): Payer: Medicare HMO

## 2014-01-20 ENCOUNTER — Telehealth: Payer: Self-pay | Admitting: Oncology

## 2014-01-20 ENCOUNTER — Encounter: Payer: Self-pay | Admitting: Oncology

## 2014-01-20 VITALS — BP 109/48 | HR 69 | Temp 98.2°F | Resp 18 | Ht 64.0 in | Wt 99.9 lb

## 2014-01-20 DIAGNOSIS — Z85118 Personal history of other malignant neoplasm of bronchus and lung: Secondary | ICD-10-CM

## 2014-01-20 DIAGNOSIS — Z85528 Personal history of other malignant neoplasm of kidney: Secondary | ICD-10-CM

## 2014-01-20 DIAGNOSIS — N289 Disorder of kidney and ureter, unspecified: Secondary | ICD-10-CM

## 2014-01-20 DIAGNOSIS — C649 Malignant neoplasm of unspecified kidney, except renal pelvis: Secondary | ICD-10-CM

## 2014-01-20 DIAGNOSIS — R634 Abnormal weight loss: Secondary | ICD-10-CM

## 2014-01-20 DIAGNOSIS — F3289 Other specified depressive episodes: Secondary | ICD-10-CM

## 2014-01-20 DIAGNOSIS — F329 Major depressive disorder, single episode, unspecified: Secondary | ICD-10-CM

## 2014-01-20 DIAGNOSIS — D490 Neoplasm of unspecified behavior of digestive system: Secondary | ICD-10-CM

## 2014-01-20 LAB — COMPREHENSIVE METABOLIC PANEL (CC13)
ALBUMIN: 3.5 g/dL (ref 3.5–5.0)
ANION GAP: 7 meq/L (ref 3–11)
AST: 19 U/L (ref 5–34)
Alkaline Phosphatase: 85 U/L (ref 40–150)
BUN: 27.6 mg/dL — ABNORMAL HIGH (ref 7.0–26.0)
CALCIUM: 9.1 mg/dL (ref 8.4–10.4)
CO2: 25 meq/L (ref 22–29)
Chloride: 108 mEq/L (ref 98–109)
Creatinine: 1.3 mg/dL — ABNORMAL HIGH (ref 0.6–1.1)
Glucose: 81 mg/dl (ref 70–140)
Potassium: 4.3 mEq/L (ref 3.5–5.1)
Sodium: 140 mEq/L (ref 136–145)
TOTAL PROTEIN: 6.2 g/dL — AB (ref 6.4–8.3)
Total Bilirubin: 0.57 mg/dL (ref 0.20–1.20)

## 2014-01-20 LAB — CBC WITH DIFFERENTIAL/PLATELET
BASO%: 0.8 % (ref 0.0–2.0)
Basophils Absolute: 0 10*3/uL (ref 0.0–0.1)
EOS%: 5.4 % (ref 0.0–7.0)
Eosinophils Absolute: 0.2 10*3/uL (ref 0.0–0.5)
HCT: 34.5 % — ABNORMAL LOW (ref 34.8–46.6)
HGB: 11.1 g/dL — ABNORMAL LOW (ref 11.6–15.9)
LYMPH%: 33.9 % (ref 14.0–49.7)
MCH: 30.1 pg (ref 25.1–34.0)
MCHC: 32.2 g/dL (ref 31.5–36.0)
MCV: 93.5 fL (ref 79.5–101.0)
MONO#: 0.4 10*3/uL (ref 0.1–0.9)
MONO%: 10.2 % (ref 0.0–14.0)
NEUT#: 2 10*3/uL (ref 1.5–6.5)
NEUT%: 49.7 % (ref 38.4–76.8)
Platelets: 218 10*3/uL (ref 145–400)
RBC: 3.69 10*6/uL — ABNORMAL LOW (ref 3.70–5.45)
RDW: 13.1 % (ref 11.2–14.5)
WBC: 3.9 10*3/uL (ref 3.9–10.3)
lymph#: 1.3 10*3/uL (ref 0.9–3.3)

## 2014-01-20 NOTE — Telephone Encounter (Signed)
gv and printed appt sched and avs for pt for March 2016...gv pt barium

## 2014-01-20 NOTE — Progress Notes (Signed)
Hematology and Oncology Follow Up Visit  ALAN RILES [id number] [birth year] 80 y.o. 01/20/2014 8:52 AM  CC: [name], M.D.  [name], M.D.   Principle Diagnosis: This is a 80 year old female with renal cell carcinoma initially diagnosed with stage IIIB in August 2001.  She has developed a lung metastasis with stage IV disease in 2007. And recently found to have a pancreatic lesion.  Prior Therapy: 1.         She had underwent a radical nephrectomy in August 2001.  She had a stage IIIB disease. 2. The patient developed pulmonary metastases in July 2007 status post surgical resection.  She has not had any recurrent disease since that time. 3.         S/P Cryoablation of a left kidney mass done in 09/2010 Done by IR. 4.         S/P recent FNA of a pancreatic lesion by Dr. Ardis Hughs. Pathology indicate metastatic renal cell cancer in 04/2011.   Current therapy: Observation and surveillance.  Interim History: Ms. Vigeant presents today for a followup visit with her sister. She reports no new changes since the last visit. She continues to live independently and attends activities of daily living. She is able to walk her dog and has not reported any falls or syncope. She had not rally report any  pulmonary symptoms such as chest pain, shortness of breath, difficulty breathing. She had not reported any hemoptysis.  She had not reported any abdominal pain.  She had not reported any discomfort.  Her energy and performance status, although marginal is unchanged. She has a good quality of life at this point. She is eating better and likely gain one or 2 pounds in the last 6 months. She is not reporting any gastrointestinal symptoms. She continues to be a rather frail and have problems with memory at times. She has not reported any lymphadenopathy or petechiae. Has not reported any hematuria or genitourinary complaints. Rest of her review of systems unremarkable.  Medications: Current  Outpatient Prescriptions  Medication Sig Dispense Refill  . buPROPion (WELLBUTRIN SR) 150 MG 12 hr tablet Take 1 tablet by mouth Twice daily.      . Calcium Carbonate-Vitamin D (CALCIUM + D PO) Take by mouth daily.        . DULoxetine (CYMBALTA) 30 MG capsule Take 30 mg by mouth daily.      . Multiple Vitamin (MULTIVITAMIN) tablet Take 1 tablet by mouth daily.        . Omega-3 Fatty Acids (FISH OIL) 1200 MG CAPS Take by mouth daily.         No current facility-administered medications for this visit.    Allergies: No Known Allergies  Past Medical History, Surgical history, Social history, and Family History were reviewed and updated.   Physical Exam: Blood pressure 109/48, pulse 69, temperature 98.2 F (36.8 C), temperature source Oral, resp. rate 18, height 5\' 4"  (1.626 m), weight 99 lb 14.4 oz (45.314 kg), SpO2 99.00%. ECOG: 1 General appearance: Alert, awake and frail appearing women. Head: Normocephalic, without obvious abnormality, atraumatic Neck: no adenopathy Lymph nodes: Cervical, supraclavicular, and axillary nodes normal. Heart:regular rate and rhythm, S1, S2 normal, no murmur, click, rub or gallop Lung:chest clear, no wheezing, rales, normal symmetric air entry Abdomin: soft, non-tender, without masses or organomegaly EXT:no erythema, induration, or nodules   Lab Results: Lab Results  Component Value Date   WBC 3.9 01/20/2014   HGB 11.1* 01/20/2014   HCT  34.5* 01/20/2014   MCV 93.5 01/20/2014   PLT 218 01/20/2014     Chemistry      Component Value Date/Time   NA 140 07/19/2013 1052   NA 138 10/21/2011 0818   NA 144 07/16/2011 0928   K 4.7 07/19/2013 1052   K 4.9 10/21/2011 0818   K 4.8* 07/16/2011 0928   CL 108* 10/05/2012 1454   CL 106 10/21/2011 0818   CL 100 07/16/2011 0928   CO2 25 07/19/2013 1052   CO2 24 10/21/2011 0818   CO2 28 07/16/2011 0928   BUN 25.1 07/19/2013 1052   BUN 32* 10/21/2011 0818   BUN 22 07/16/2011 0928   CREATININE 1.2* 07/19/2013 1052    CREATININE 1.25* 10/21/2011 0818   CREATININE 1.3* 07/16/2011 0928      Component Value Date/Time   CALCIUM 9.6 07/19/2013 1052   CALCIUM 9.1 10/21/2011 0818   CALCIUM 8.8 07/16/2011 0928   ALKPHOS 95 07/19/2013 1052   ALKPHOS 81 10/21/2011 0818   ALKPHOS 107* 07/16/2011 0928   AST 22 07/19/2013 1052   AST 16 10/21/2011 0818   AST 29 07/16/2011 0928   ALT 9 07/19/2013 1052   ALT <8 10/21/2011 0818   ALT 17 07/16/2011 0928   BILITOT 0.51 07/19/2013 1052   BILITOT 0.4 10/21/2011 0818   BILITOT 0.70 07/16/2011 0928      Impression and Plan:  This is a pleasant 80 year old female with the following issues: 1. Stage IV renal cell carcinoma, clear cell histology resected initially in 2001.  She had a metastasectomy in 2007 for a lung lesion.  She is also S/P Cryoablation of a Left renal mass. She also has documented pancreatic lesion biopsy proven to be renal in etiology. CT scans from 07/19/2013  showed stable disease for the most part. Systemic therapy is not indicated at this time given the slow growth and stability of her cancer. The plan is to continue observation and repeat CT scan in 6 months. 2. Depression.  She is doing better from that. Her mood is certainly improved. 3. Weight loss.  She is doing better from that standpoint and has gained about 1-2 pounds since the last visit. 4.         Left Kidney mass: I doubt any local therapy can be done any more. Continued observation for now. 5.         Follow up: in 6 months.   [facility], MD 9/18/20158:52 AM

## 2014-07-19 ENCOUNTER — Encounter (HOSPITAL_COMMUNITY): Payer: Self-pay

## 2014-07-19 ENCOUNTER — Other Ambulatory Visit (HOSPITAL_BASED_OUTPATIENT_CLINIC_OR_DEPARTMENT_OTHER): Payer: PPO

## 2014-07-19 ENCOUNTER — Ambulatory Visit (HOSPITAL_COMMUNITY)
Admission: RE | Admit: 2014-07-19 | Discharge: 2014-07-19 | Disposition: A | Discharge status: Home / Self Care | Payer: PPO | Source: Ambulatory Visit | Attending: Oncology | Admitting: Oncology

## 2014-07-19 DIAGNOSIS — Z85528 Personal history of other malignant neoplasm of kidney: Secondary | ICD-10-CM

## 2014-07-19 DIAGNOSIS — C7800 Secondary malignant neoplasm of unspecified lung: Secondary | ICD-10-CM | POA: Diagnosis not present

## 2014-07-19 DIAGNOSIS — C801 Malignant (primary) neoplasm, unspecified: Secondary | ICD-10-CM | POA: Diagnosis not present

## 2014-07-19 DIAGNOSIS — N2889 Other specified disorders of kidney and ureter: Secondary | ICD-10-CM | POA: Insufficient documentation

## 2014-07-19 DIAGNOSIS — C7889 Secondary malignant neoplasm of other digestive organs: Secondary | ICD-10-CM | POA: Insufficient documentation

## 2014-07-19 DIAGNOSIS — Z905 Acquired absence of kidney: Secondary | ICD-10-CM | POA: Insufficient documentation

## 2014-07-19 DIAGNOSIS — C787 Secondary malignant neoplasm of liver and intrahepatic bile duct: Secondary | ICD-10-CM | POA: Diagnosis not present

## 2014-07-19 DIAGNOSIS — C649 Malignant neoplasm of unspecified kidney, except renal pelvis: Secondary | ICD-10-CM

## 2014-07-19 DIAGNOSIS — Z85118 Personal history of other malignant neoplasm of bronchus and lung: Secondary | ICD-10-CM

## 2014-07-19 DIAGNOSIS — M4856XA Collapsed vertebra, not elsewhere classified, lumbar region, initial encounter for fracture: Secondary | ICD-10-CM | POA: Insufficient documentation

## 2014-07-19 DIAGNOSIS — N289 Disorder of kidney and ureter, unspecified: Secondary | ICD-10-CM

## 2014-07-19 LAB — CBC WITH DIFFERENTIAL/PLATELET
BASO%: 1.9 % (ref 0.0–2.0)
Basophils Absolute: 0.1 10*3/uL (ref 0.0–0.1)
EOS%: 4.7 % (ref 0.0–7.0)
Eosinophils Absolute: 0.2 10*3/uL (ref 0.0–0.5)
HCT: 37.1 % (ref 34.8–46.6)
HGB: 11.9 g/dL (ref 11.6–15.9)
LYMPH%: 35.6 % (ref 14.0–49.7)
MCH: 29.8 pg (ref 25.1–34.0)
MCHC: 32.1 g/dL (ref 31.5–36.0)
MCV: 93 fL (ref 79.5–101.0)
MONO#: 0.4 10*3/uL (ref 0.1–0.9)
MONO%: 10.4 % (ref 0.0–14.0)
NEUT#: 2 10*3/uL (ref 1.5–6.5)
NEUT%: 47.4 % (ref 38.4–76.8)
PLATELETS: 233 10*3/uL (ref 145–400)
RBC: 3.99 10*6/uL (ref 3.70–5.45)
RDW: 14 % (ref 11.2–14.5)
WBC: 4.2 10*3/uL (ref 3.9–10.3)
lymph#: 1.5 10*3/uL (ref 0.9–3.3)

## 2014-07-19 LAB — COMPREHENSIVE METABOLIC PANEL (CC13)
ALT: 10 U/L (ref 0–55)
AST: 20 U/L (ref 5–34)
Albumin: 3.7 g/dL (ref 3.5–5.0)
Alkaline Phosphatase: 124 U/L (ref 40–150)
Anion Gap: 9 mEq/L (ref 3–11)
BILIRUBIN TOTAL: 0.48 mg/dL (ref 0.20–1.20)
BUN: 27.6 mg/dL — ABNORMAL HIGH (ref 7.0–26.0)
CO2: 27 mEq/L (ref 22–29)
Calcium: 9.3 mg/dL (ref 8.4–10.4)
Chloride: 103 mEq/L (ref 98–109)
Creatinine: 1.2 mg/dL — ABNORMAL HIGH (ref 0.6–1.1)
EGFR: 42 mL/min/{1.73_m2} — ABNORMAL LOW (ref >90)
Glucose: 80 mg/dl (ref 70–140)
POTASSIUM: 4.5 meq/L (ref 3.5–5.1)
SODIUM: 139 meq/L (ref 136–145)
Total Protein: 7 g/dL (ref 6.4–8.3)

## 2014-07-26 ENCOUNTER — Telehealth: Payer: Self-pay | Admitting: Oncology

## 2014-07-26 ENCOUNTER — Ambulatory Visit: Payer: Medicare HMO | Admitting: Oncology

## 2014-07-26 ENCOUNTER — Ambulatory Visit (HOSPITAL_BASED_OUTPATIENT_CLINIC_OR_DEPARTMENT_OTHER): Payer: PPO | Admitting: Oncology

## 2014-07-26 VITALS — BP 122/63 | HR 71 | Temp 97.8°F | Resp 17 | Ht 64.0 in | Wt 99.1 lb

## 2014-07-26 DIAGNOSIS — N2889 Other specified disorders of kidney and ureter: Secondary | ICD-10-CM

## 2014-07-26 DIAGNOSIS — C649 Malignant neoplasm of unspecified kidney, except renal pelvis: Secondary | ICD-10-CM

## 2014-07-26 DIAGNOSIS — Z85118 Personal history of other malignant neoplasm of bronchus and lung: Secondary | ICD-10-CM

## 2014-07-26 DIAGNOSIS — Z85528 Personal history of other malignant neoplasm of kidney: Secondary | ICD-10-CM | POA: Diagnosis not present

## 2014-07-26 DIAGNOSIS — R634 Abnormal weight loss: Secondary | ICD-10-CM

## 2014-07-26 MED ORDER — MEGESTROL ACETATE 400 MG/10ML PO SUSP: 400.0000 mg | Freq: Two times a day (BID) | ORAL | Status: AC

## 2014-07-26 NOTE — Progress Notes (Signed)
Hematology and Oncology Follow Up Visit  [name] [id number] 05-20-1932 81 y.o. 07/26/2014 9:32 AM  CC: [name], M.D.  [name], M.D.   Principle Diagnosis: This is a 81 year old female with renal cell carcinoma initially diagnosed with stage IIIB in August 2001.  She has developed a lung metastasis with stage IV disease in 2007. And recently found to have a pancreatic lesion.  Prior Therapy: 1.         She had underwent a radical nephrectomy in August 2001.  She had a stage IIIB disease. 2. The patient developed pulmonary metastases in July 2007 status post surgical resection.  She has not had any recurrent disease since that time. 3.         S/P Cryoablation of a left kidney mass done in 09/2010 Done by IR. 4.         S/P recent FNA of a pancreatic lesion by Dr. Ardis Hughs. Pathology indicate metastatic renal cell cancer in 04/2011.   Current therapy: Observation and surveillance.  Interim History: [name] presents today for a followup visit with her sister. This last visit, she suffered a compression fracture in her lumbar spine and currently wearing a brace. She reports that while she was reaching for her grandchild she had immediate back pain. Imaging studies did not reveal any pathological fracture in her lumbar spine. Since wearing the brace, her pain has improved dramatically. She has not reported any falls or syncope. She had not rally report any  pulmonary symptoms such as chest pain, shortness of breath, difficulty breathing. She had not reported any hemoptysis.  She had not reported any abdominal pain.  She had not reported any discomfort.  Her energy and performance status, although marginal is unchanged. She has a good quality of life at this point. She is eating better but continues to lose weight. She is not reporting any gastrointestinal symptoms. She continues to be a rather frail and have problems with memory at times. She has not reported any  lymphadenopathy or petechiae. Has not reported any hematuria or genitourinary complaints. Rest of her review of systems unremarkable.  Medications: Current Outpatient Prescriptions  Medication Sig Dispense Refill  . buPROPion (WELLBUTRIN SR) 150 MG 12 hr tablet Take 1 tablet by mouth Twice daily.    . Calcium Carbonate-Vitamin D (CALCIUM + D PO) Take by mouth daily.      . DULoxetine (CYMBALTA) 30 MG capsule Take 30 mg by mouth daily.    . Multiple Vitamin (MULTIVITAMIN) tablet Take 1 tablet by mouth daily.      . Omega-3 Fatty Acids (FISH OIL) 1200 MG CAPS Take by mouth daily.       No current facility-administered medications for this visit.    Allergies: No Known Allergies  Past Medical History, Surgical history, Social history, and Family History were reviewed and updated.   Physical Exam: Blood pressure 122/63, pulse 71, temperature 97.8 F (36.6 C), temperature source Oral, resp. rate 17, height 5\' 4"  (1.626 m), weight 99 lb 1.6 oz (44.951 kg), SpO2 99 %. ECOG: 2 General appearance: Alert, awake and frail appearing women. Head: Normocephalic, without obvious abnormality, atraumatic Neck: no adenopathy Lymph nodes: Cervical, supraclavicular, and axillary nodes normal. Heart:regular rate and rhythm, S1, S2 normal, no murmur, click, rub or gallop Lung:chest clear, no wheezing, rales, normal symmetric air entry Abdomin: soft, non-tender, without masses or organomegaly EXT:no erythema, induration, or nodules   Lab Results: Lab Results  Component Value Date   WBC  4.2 07/19/2014   HGB 11.9 07/19/2014   HCT 37.1 07/19/2014   MCV 93.0 07/19/2014   PLT 233 07/19/2014     Chemistry      Component Value Date/Time   NA 139 07/19/2014 0811   NA 138 10/21/2011 0818   NA 144 07/16/2011 0928   K 4.5 07/19/2014 0811   K 4.9 10/21/2011 0818   K 4.8* 07/16/2011 0928   CL 108* 10/05/2012 1454   CL 106 10/21/2011 0818   CL 100 07/16/2011 0928   CO2 27 07/19/2014 0811   CO2 24  10/21/2011 0818   CO2 28 07/16/2011 0928   BUN 27.6* 07/19/2014 0811   BUN 32* 10/21/2011 0818   BUN 22 07/16/2011 0928   CREATININE 1.2* 07/19/2014 0811   CREATININE 1.25* 10/21/2011 0818   CREATININE 1.3* 07/16/2011 0928      Component Value Date/Time   CALCIUM 9.3 07/19/2014 0811   CALCIUM 9.1 10/21/2011 0818   CALCIUM 8.8 07/16/2011 0928   ALKPHOS 124 07/19/2014 0811   ALKPHOS 81 10/21/2011 0818   ALKPHOS 107* 07/16/2011 0928   AST 20 07/19/2014 0811   AST 16 10/21/2011 0818   AST 29 07/16/2011 0928   ALT 10 07/19/2014 0811   ALT <8 10/21/2011 0818   ALT 17 07/16/2011 0928   BILITOT 0.48 07/19/2014 0811   BILITOT 0.4 10/21/2011 0818   BILITOT 0.70 07/16/2011 0928      EXAM: CT CHEST, ABDOMEN AND PELVIS WITHOUT CONTRAST  TECHNIQUE: Multidetector CT imaging of the chest, abdomen and pelvis was performed following the standard protocol without IV contrast.  COMPARISON: Prior examinations 11/01/2012 and 07/19/2013.  FINDINGS: CT CHEST FINDINGS  Mediastinum/Nodes: There are no enlarged mediastinal, hilar or axillary lymph nodes. Hilar evaluation is mildly limited by the lack of contrast. The hilar contours appear unchanged. The thyroid gland, trachea and esophagus demonstrate no significant findings. The heart size is normal. There is no pericardial effusion.Stable mild atherosclerosis of the aorta, great vessels and coronary arteries. There is stable mild dilatation of the ascending aorta to 3.8 cm.  Lungs/Pleura: There is no pleural effusion.Evidence of previous partial left lower lobe resection. There are enlarging pulmonary nodules bilaterally consistent with metastatic disease. Right upper lobe nodule measures 12 mm on image 19 (previously 10 mm). Right lower lobe metastases measure 10 mm on image 40, 8 mm on image 43 and 15 mm on image 47. The largest previously measured 13 mm. Left basilar nodule which appears to be in the upper lobe measures 12  mm on image 44 (previously 10 mm. Additional smaller metastases are likely.  Musculoskeletal/Chest wall: No suspicious chest wall lesion or osseous abnormality. There are old rib fractures or thoracotomy defects bilaterally.  CT ABDOMEN AND PELVIS FINDINGS  Hepatobiliary: Multiple new low-density hepatic lesions, highly worrisome for metastatic disease. The largest lesions are in the right lobe, measuring 9 mm on image 56, 11 mm on image 58 and 11 mm on image 60. No evidence of gallstones, gallbladder wall thickening or biliary dilatation.  Pancreas: There is an approximately 2.2 cm mass involving the pancreatic body on image 61. The pancreatic tail is atrophied. There is pancreatic ductal dilatation within the tail.  Spleen: Normal in size without focal abnormality.  Adrenals/Urinary Tract: No evidence of adrenal mass.Previous right nephrectomy. The nephrectomy bed appears normal. Partially calcified cryoablation is zone in the interpolar region of the left kidney is grossly stable. An adjacent low-density lesion measuring approximately 1.8 x 1.7 cm on image 62  is also grossly stable. However, there is concern of an enlarging slightly hyperdense lesion in the medial interpolar region, measuring 1.9 cm on image 67. No evidence of hydronephrosis or urinary tract calculus. The bladder demonstrates no significant findings.  Stomach/Bowel: No evidence of bowel wall thickening, distention or surrounding inflammatory change.Moderate stool in the distal colon. No ascites or definite peritoneal nodularity.  Vascular/Lymphatic: There are no enlarged abdominal or pelvic lymph nodes. Stable aortoiliac atherosclerosis.  Reproductive: The uterus and ovaries appear stable. No adnexal mass.  Other: No evidence of abdominal wall mass or hernia.  Musculoskeletal: New biconcave compression fracture at L3 is associated with irregular sclerosis and lucency in the vertebral body,  most likely representing a subacute benign fracture. There is mild osseous retropulsion. No gross bone destruction or associated epidural soft tissue mass to suggest metastatic disease. Previous bilateral proximal femoral ORIF.  IMPRESSION: 1. Progressive pulmonary metastatic disease. 2. Additional metastatic disease within the liver and pancreas. 3. Enlarging mass medially in the left kidney worrisome for new renal cell carcinoma or metastasis. 4. L3 compression fracture, favored to be benign. No definite osseous metastases.   Impression and Plan:  This is a pleasant 81 year old female with the following issues: 1. Stage IV renal cell carcinoma, clear cell histology resected initially in 2001.  She had a metastasectomy in 2007 for a lung lesion.  She is also S/P Cryoablation of a Left renal mass. She also has documented pancreatic lesion biopsy proven to be renal in etiology. CT scans from 07/19/2014  showed progression of disease especially with pulmonary metastasis, adrenal metastasis and possible hepatic metastasis. These findings were discussed with the patient and her family extensively. Options of treatments were discussed today including systemic therapy versus local individualized therapy. I feel her disease is progressing on a systemic level and I think local therapy would not necessarily alter the natural course of her disease anymore. Risks and benefits of using systemic therapy including oral agents were discussed. Given her age, frail status and the side effects associated with these medications she declined. I feel that supportive care only is her best option especially in the setting of progressing cancer in multiple areas of her body. I recommended continue supportive management and possible hospice enrollment in the future once her condition declines. 2. Depression.  She is doing better from that. Her mood is certainly improved. 3. Weight loss.  I will give her a prescription for  Megace to boost her appetite. 4.         Left Kidney mass: I doubt any local therapy can be done any more. Continued observation for now. 5.         Follow up: in 4 months.   [facility], MD 3/23/20169:32 AM

## 2014-07-26 NOTE — Telephone Encounter (Signed)
Pt confirmed labs/ov per 03/23 POF, gave pt AVS and Calendar... KJ

## 2014-11-24 ENCOUNTER — Telehealth: Payer: Self-pay | Admitting: Oncology

## 2014-11-24 ENCOUNTER — Other Ambulatory Visit (HOSPITAL_BASED_OUTPATIENT_CLINIC_OR_DEPARTMENT_OTHER): Payer: PPO

## 2014-11-24 ENCOUNTER — Ambulatory Visit (HOSPITAL_BASED_OUTPATIENT_CLINIC_OR_DEPARTMENT_OTHER): Payer: PPO | Admitting: Oncology

## 2014-11-24 VITALS — BP 110/54 | HR 79 | Resp 18 | Ht 64.0 in | Wt 96.8 lb

## 2014-11-24 DIAGNOSIS — R634 Abnormal weight loss: Secondary | ICD-10-CM | POA: Diagnosis not present

## 2014-11-24 DIAGNOSIS — C649 Malignant neoplasm of unspecified kidney, except renal pelvis: Secondary | ICD-10-CM

## 2014-11-24 DIAGNOSIS — N2889 Other specified disorders of kidney and ureter: Secondary | ICD-10-CM | POA: Diagnosis not present

## 2014-11-24 DIAGNOSIS — Z85528 Personal history of other malignant neoplasm of kidney: Secondary | ICD-10-CM | POA: Diagnosis not present

## 2014-11-24 DIAGNOSIS — F329 Major depressive disorder, single episode, unspecified: Secondary | ICD-10-CM

## 2014-11-24 DIAGNOSIS — Z85118 Personal history of other malignant neoplasm of bronchus and lung: Secondary | ICD-10-CM

## 2014-11-24 LAB — COMPREHENSIVE METABOLIC PANEL (CC13)
ALK PHOS: 96 U/L (ref 40–150)
ALT: 8 U/L (ref 0–55)
ANION GAP: 9 meq/L (ref 3–11)
AST: 19 U/L (ref 5–34)
Albumin: 3.5 g/dL (ref 3.5–5.0)
BILIRUBIN TOTAL: 0.62 mg/dL (ref 0.20–1.20)
BUN: 26.3 mg/dL — ABNORMAL HIGH (ref 7.0–26.0)
CALCIUM: 9.5 mg/dL (ref 8.4–10.4)
CO2: 25 mEq/L (ref 22–29)
CREATININE: 1.4 mg/dL — AB (ref 0.6–1.1)
Chloride: 105 mEq/L (ref 98–109)
EGFR: 34 mL/min/{1.73_m2} — ABNORMAL LOW (ref >90)
Glucose: 113 mg/dl (ref 70–140)
Potassium: 4.1 mEq/L (ref 3.5–5.1)
SODIUM: 138 meq/L (ref 136–145)
Total Protein: 6.7 g/dL (ref 6.4–8.3)

## 2014-11-24 LAB — CBC WITH DIFFERENTIAL/PLATELET
BASO%: 0.6 % (ref 0.0–2.0)
BASOS ABS: 0 10*3/uL (ref 0.0–0.1)
EOS%: 3 % (ref 0.0–7.0)
Eosinophils Absolute: 0.1 10*3/uL (ref 0.0–0.5)
HCT: 36.9 % (ref 34.8–46.6)
HEMOGLOBIN: 12 g/dL (ref 11.6–15.9)
LYMPH%: 26.1 % (ref 14.0–49.7)
MCH: 30 pg (ref 25.1–34.0)
MCHC: 32.5 g/dL (ref 31.5–36.0)
MCV: 92.3 fL (ref 79.5–101.0)
MONO#: 0.4 10*3/uL (ref 0.1–0.9)
MONO%: 7.5 % (ref 0.0–14.0)
NEUT%: 62.8 % (ref 38.4–76.8)
NEUTROS ABS: 2.9 10*3/uL (ref 1.5–6.5)
Platelets: 257 10*3/uL (ref 145–400)
RBC: 4 10*6/uL (ref 3.70–5.45)
RDW: 13.2 % (ref 11.2–14.5)
WBC: 4.6 10*3/uL (ref 3.9–10.3)
lymph#: 1.2 10*3/uL (ref 0.9–3.3)

## 2014-11-24 NOTE — Progress Notes (Signed)
Hematology and Oncology Follow Up Visit  [name] [id number] 03-31-1933 81 y.o. 11/24/2014 8:26 AM  CC: [name], M.D.  [name], M.D.   Principle Diagnosis: This is a 81 year old female with renal cell carcinoma initially diagnosed with stage IIIB in August 2001.  She has developed a lung metastasis with stage IV disease in 2007. And recently found to have stomach disease recurrence.  Prior Therapy: 1.         She had underwent a radical nephrectomy in August 2001.  She had a stage IIIB disease. 2. The patient developed pulmonary metastases in July 2007 status post surgical resection.  She has not had any recurrent disease since that time. 3.         S/P Cryoablation of a left kidney mass done in 09/2010 Done by IR. 4.         S/P recent FNA of a pancreatic lesion by Dr. Ardis Hughs. Pathology indicate metastatic renal cell cancer in 04/2011.   Current therapy: Observation and surveillance. Patient is not a great candidate for systemic therapy.  Interim History: [name] presents today for a followup visit with her sister. This last visit, she continues to be relatively stable. Her appetite have been reasonable although lost 2 pounds. She reports her pain has been much improved and nearly resolved and her back. She has not reported any falls or syncope. She still able to walk her dog without any issues. She still has a reasonable quality of life. She had not rally report any  pulmonary symptoms such as chest pain, shortness of breath, difficulty breathing. She had not reported any hemoptysis.  She had not reported any abdominal pain.  She had not reported any discomfort.  Her energy and performance status, although marginal is unchanged. She has a good quality of life at this point. She is eating better but continues to lose weight. She is not reporting any gastrointestinal symptoms. She continues to be a rather frail and have problems with memory at times. She has not  reported any lymphadenopathy or petechiae. Has not reported any hematuria or genitourinary complaints. Rest of her review of systems unremarkable.  Medications: Current Outpatient Prescriptions  Medication Sig Dispense Refill  . buPROPion (WELLBUTRIN SR) 150 MG 12 hr tablet Take 1 tablet by mouth Twice daily.    . Calcium Carbonate-Vitamin D (CALCIUM + D PO) Take by mouth daily.      . DULoxetine (CYMBALTA) 30 MG capsule Take 30 mg by mouth daily.    . megestrol (MEGACE) 400 MG/10ML suspension Take 10 mLs (400 mg total) by mouth 2 (two) times daily. 240 mL 0  . Multiple Vitamin (MULTIVITAMIN) tablet Take 1 tablet by mouth daily.      . Omega-3 Fatty Acids (FISH OIL) 1200 MG CAPS Take by mouth daily.       No current facility-administered medications for this visit.    Allergies: No Known Allergies  Past Medical History, Surgical history, Social history, and Family History were reviewed and updated.   Physical Exam: Blood pressure 110/54, pulse 79, resp. rate 18, height '5\' 4"'$  (1.626 m), weight 96 lb 12.8 oz (43.908 kg), SpO2 100 %. ECOG: 2 General appearance: Alert, awake and frail appearing women. Head: Normocephalic, without obvious abnormality, atraumatic Neck: no adenopathy Lymph nodes: Cervical, supraclavicular, and axillary nodes normal. Heart:regular rate and rhythm, S1, S2 normal, no murmur, click, rub or gallop Lung:chest clear, no wheezing, rales, normal symmetric air entry Abdomin: soft, non-tender, without masses  or organomegaly EXT:no erythema, induration, or nodules   Lab Results: Lab Results  Component Value Date   WBC 4.6 11/24/2014   HGB 12.0 11/24/2014   HCT 36.9 11/24/2014   MCV 92.3 11/24/2014   PLT 257 11/24/2014     Chemistry      Component Value Date/Time   NA 139 07/19/2014 0811   NA 138 10/21/2011 0818   NA 144 07/16/2011 0928   K 4.5 07/19/2014 0811   K 4.9 10/21/2011 0818   K 4.8* 07/16/2011 0928   CL 108* 10/05/2012 1454   CL 106  10/21/2011 0818   CL 100 07/16/2011 0928   CO2 27 07/19/2014 0811   CO2 24 10/21/2011 0818   CO2 28 07/16/2011 0928   BUN 27.6* 07/19/2014 0811   BUN 32* 10/21/2011 0818   BUN 22 07/16/2011 0928   CREATININE 1.2* 07/19/2014 0811   CREATININE 1.25* 10/21/2011 0818   CREATININE 1.3* 07/16/2011 0928      Component Value Date/Time   CALCIUM 9.3 07/19/2014 0811   CALCIUM 9.1 10/21/2011 0818   CALCIUM 8.8 07/16/2011 0928   ALKPHOS 124 07/19/2014 0811   ALKPHOS 81 10/21/2011 0818   ALKPHOS 107* 07/16/2011 0928   AST 20 07/19/2014 0811   AST 16 10/21/2011 0818   AST 29 07/16/2011 0928   ALT 10 07/19/2014 0811   ALT <8 10/21/2011 0818   ALT 17 07/16/2011 0928   BILITOT 0.48 07/19/2014 0811   BILITOT 0.4 10/21/2011 0818   BILITOT 0.70 07/16/2011 0928       Impression and Plan:  This is a pleasant 81 year old female with the following issues: 1. Stage IV renal cell carcinoma, clear cell histology resected initially in 2001.  She had a metastasectomy in 2007 for a lung lesion.  She is also S/P Cryoablation of a Left renal mass. She also has documented pancreatic lesion biopsy proven to be renal in etiology. CT scans from 07/19/2014  showed progression of disease especially with pulmonary metastasis, adrenal metastasis and possible hepatic metastasis. I'll continue to discuss the treatment options with her and this visit including systemic therapy versus observation and surveillance. Given her frail status, I am not sure she'll be able to tolerate even mild tyrosine kinase inhibitors. I recommended continue supportive management and possible hospice enrollment in the future once her condition declines. 2. Depression.  She is doing better from that. Her mood is certainly improved. 3. Weight loss.  I have instructed her to continue to use Megace as well as nutritional supplements. We have discussed different maneuvers to improve her nutritional supplements intake. 4.         Left Kidney mass: I  doubt any local therapy can be done any more. Continued observation for now. 5.         Follow up: in 5 months.   [facility], MD 7/22/20168:26 AM

## 2014-11-24 NOTE — Telephone Encounter (Signed)
Gave and printed appts ched and avs for pt for DEC  °

## 2014-12-20 ENCOUNTER — Other Ambulatory Visit: Payer: Self-pay | Admitting: Oncology

## 2014-12-20 ENCOUNTER — Telehealth: Payer: Self-pay | Admitting: *Deleted

## 2014-12-20 DIAGNOSIS — C649 Malignant neoplasm of unspecified kidney, except renal pelvis: Secondary | ICD-10-CM

## 2014-12-20 NOTE — Telephone Encounter (Signed)
A note to the schedulers sent to get her an earlier appointment.

## 2014-12-20 NOTE — Telephone Encounter (Signed)
Patient's sister Collene Gobble claudettecalled requesting patient's appointment be moved up.  "She isn't scheduled to see Dr. Alen Blew again until Decemer 6, 2016.  She has three different cancers, has no energy, is depressed and I see a change in her attitude and feelings.  Says she needs to talk with Dr. Alen Blew to get a better understanding of what's going on and maybe she'll feel better.  She doesn't hear well so please call me at [phone]."

## 2014-12-21 ENCOUNTER — Telehealth: Payer: Self-pay | Admitting: Oncology

## 2014-12-21 NOTE — Telephone Encounter (Signed)
S/w pt's sister Apolonio Schneiders confirming labs/ov per 08/17 POF.... Cherylann Banas

## 2014-12-22 NOTE — Telephone Encounter (Signed)
Next F/u is 01-04-2015

## 2015-01-04 ENCOUNTER — Other Ambulatory Visit (HOSPITAL_BASED_OUTPATIENT_CLINIC_OR_DEPARTMENT_OTHER): Payer: PPO

## 2015-01-04 ENCOUNTER — Ambulatory Visit (HOSPITAL_BASED_OUTPATIENT_CLINIC_OR_DEPARTMENT_OTHER): Payer: PPO | Admitting: Oncology

## 2015-01-04 ENCOUNTER — Telehealth: Payer: Self-pay | Admitting: Oncology

## 2015-01-04 VITALS — BP 108/71 | HR 79 | Temp 97.7°F | Resp 18 | Ht 64.0 in | Wt 97.7 lb

## 2015-01-04 DIAGNOSIS — Z85118 Personal history of other malignant neoplasm of bronchus and lung: Secondary | ICD-10-CM

## 2015-01-04 DIAGNOSIS — Z85528 Personal history of other malignant neoplasm of kidney: Secondary | ICD-10-CM | POA: Diagnosis not present

## 2015-01-04 DIAGNOSIS — C649 Malignant neoplasm of unspecified kidney, except renal pelvis: Secondary | ICD-10-CM

## 2015-01-04 DIAGNOSIS — F329 Major depressive disorder, single episode, unspecified: Secondary | ICD-10-CM | POA: Diagnosis not present

## 2015-01-04 DIAGNOSIS — N2889 Other specified disorders of kidney and ureter: Secondary | ICD-10-CM | POA: Diagnosis not present

## 2015-01-04 LAB — CBC WITH DIFFERENTIAL/PLATELET
BASO%: 1 % (ref 0.0–2.0)
Basophils Absolute: 0.1 10*3/uL (ref 0.0–0.1)
EOS%: 5 % (ref 0.0–7.0)
Eosinophils Absolute: 0.3 10*3/uL (ref 0.0–0.5)
HEMATOCRIT: 38 % (ref 34.8–46.6)
HGB: 12.5 g/dL (ref 11.6–15.9)
LYMPH#: 1.1 10*3/uL (ref 0.9–3.3)
LYMPH%: 21.6 % (ref 14.0–49.7)
MCH: 29.7 pg (ref 25.1–34.0)
MCHC: 32.9 g/dL (ref 31.5–36.0)
MCV: 90.2 fL (ref 79.5–101.0)
MONO#: 0.5 10*3/uL (ref 0.1–0.9)
MONO%: 9.9 % (ref 0.0–14.0)
NEUT#: 3.2 10*3/uL (ref 1.5–6.5)
NEUT%: 62.5 % (ref 38.4–76.8)
Platelets: 281 10*3/uL (ref 145–400)
RBC: 4.21 10*6/uL (ref 3.70–5.45)
RDW: 13.6 % (ref 11.2–14.5)
WBC: 5.2 10*3/uL (ref 3.9–10.3)

## 2015-01-04 LAB — COMPREHENSIVE METABOLIC PANEL (CC13)
ALT: 9 U/L (ref 0–55)
ANION GAP: 7 meq/L (ref 3–11)
AST: 19 U/L (ref 5–34)
Albumin: 3.4 g/dL — ABNORMAL LOW (ref 3.5–5.0)
Alkaline Phosphatase: 91 U/L (ref 40–150)
BUN: 25 mg/dL (ref 7.0–26.0)
CHLORIDE: 107 meq/L (ref 98–109)
CO2: 26 meq/L (ref 22–29)
Calcium: 9.4 mg/dL (ref 8.4–10.4)
Creatinine: 1.3 mg/dL — ABNORMAL HIGH (ref 0.6–1.1)
EGFR: 41 mL/min/{1.73_m2} — ABNORMAL LOW (ref >90)
Glucose: 80 mg/dl (ref 70–140)
Potassium: 4.6 mEq/L (ref 3.5–5.1)
Sodium: 140 mEq/L (ref 136–145)
Total Bilirubin: 0.32 mg/dL (ref 0.20–1.20)
Total Protein: 6.8 g/dL (ref 6.4–8.3)

## 2015-01-04 NOTE — Progress Notes (Signed)
Hematology and Oncology Follow Up Visit  [name] [id number] [birth year] 81 y.o. 01/04/2015 1:15 PM  CC: [name], M.D.  [name], M.D.   Principle Diagnosis: This is a 81 year old female with renal cell carcinoma initially diagnosed with stage IIIB in August 2001.  She has developed a lung metastasis with stage IV disease in 2007. And recently found to have  Disease with pulmonary, pancreatic and lung nodules.  Prior Therapy: 1.         She had underwent a radical nephrectomy in August 2001.  She had a stage IIIB disease. 2. The patient developed pulmonary metastases in July 2007 status post surgical resection.  She has not had any recurrent disease since that time. 3.         S/P Cryoablation of a left kidney mass done in 09/2010 Done by IR. 4.         S/P recent FNA of a pancreatic lesion by Dr. Ardis Hughs. Pathology indicate metastatic renal cell cancer in 04/2011.   Current therapy: Observation and surveillance. Patient is not a great candidate for systemic therapy.  Interim History: [name] presents today for a followup visit with her sister. Since, the last visit, she continues to be relatively stable. Her appetite have been reasonable  And actually gained 2 pounds.  She did report occasional hip pain which is chronic in nature and response to acetaminophen. She has not reported any falls or syncope. She still able to walk her dog without any issues.  She  Reports no pulmonary symptoms such as chest pain, shortness of breath, difficulty breathing. She had not reported any hemoptysis.  She had not reported any abdominal pain. . She has a good quality of life at this point remained stable.   she does not report any headaches, blurry vision, syncope or seizures. She does not report any fevers, chills, sweats and weight is stable. She does not report any chest pain palpitation or leg edema. He does not report any dyspnea on exertion. She has not reported any  constipation or diarrhea. She does not report any frequency urgency  Or hematuria. Remaining review of system is unremarkable.   Medications: Current Outpatient Prescriptions  Medication Sig Dispense Refill  . buPROPion (WELLBUTRIN SR) 150 MG 12 hr tablet Take 1 tablet by mouth Twice daily.    . Calcium Carbonate-Vitamin D (CALCIUM + D PO) Take by mouth daily.      . DULoxetine (CYMBALTA) 30 MG capsule Take 30 mg by mouth daily.    . megestrol (MEGACE) 400 MG/10ML suspension Take 10 mLs (400 mg total) by mouth 2 (two) times daily. 240 mL 0  . Multiple Vitamin (MULTIVITAMIN) tablet Take 1 tablet by mouth daily.      . Omega-3 Fatty Acids (FISH OIL) 1200 MG CAPS Take by mouth daily.       No current facility-administered medications for this visit.    Allergies: No Known Allergies  Past Medical History, Surgical history, Social history, and Family History were reviewed and updated.   Physical Exam: Blood pressure 108/71, pulse 79, temperature 97.7 F (36.5 C), temperature source Oral, resp. rate 18, height '5\' 4"'$  (1.626 m), weight 97 lb 11.2 oz (44.316 kg), SpO2 98 %. ECOG: 2 General appearance: Alert, awake and frail appearing women.  Not in any distress today. Head: Normocephalic, without obvious abnormality Neck: no adenopathy Lymph nodes: Cervical, supraclavicular, and axillary nodes normal. Heart:regular rate and rhythm, S1, S2 normal, no murmur, click, rub or  gallop Lung:chest clear, no wheezing, rales, normal symmetric air entry Abdomin: soft, non-tender, without masses or organomegaly EXT:no erythema, induration, or nodules   Lab Results: Lab Results  Component Value Date   WBC 5.2 01/04/2015   HGB 12.5 01/04/2015   HCT 38.0 01/04/2015   MCV 90.2 01/04/2015   PLT 281 01/04/2015     Chemistry      Component Value Date/Time   NA 138 11/24/2014 0755   NA 138 10/21/2011 0818   NA 144 07/16/2011 0928   K 4.1 11/24/2014 0755   K 4.9 10/21/2011 0818   K 4.8*  07/16/2011 0928   CL 108* 10/05/2012 1454   CL 106 10/21/2011 0818   CL 100 07/16/2011 0928   CO2 25 11/24/2014 0755   CO2 24 10/21/2011 0818   CO2 28 07/16/2011 0928   BUN 26.3* 11/24/2014 0755   BUN 32* 10/21/2011 0818   BUN 22 07/16/2011 0928   CREATININE 1.4* 11/24/2014 0755   CREATININE 1.25* 10/21/2011 0818   CREATININE 1.3* 07/16/2011 0928      Component Value Date/Time   CALCIUM 9.5 11/24/2014 0755   CALCIUM 9.1 10/21/2011 0818   CALCIUM 8.8 07/16/2011 0928   ALKPHOS 96 11/24/2014 0755   ALKPHOS 81 10/21/2011 0818   ALKPHOS 107* 07/16/2011 0928   AST 19 11/24/2014 0755   AST 16 10/21/2011 0818   AST 29 07/16/2011 0928   ALT 8 11/24/2014 0755   ALT <8 10/21/2011 0818   ALT 17 07/16/2011 0928   BILITOT 0.62 11/24/2014 0755   BILITOT 0.4 10/21/2011 0818   BILITOT 0.70 07/16/2011 0928     EXAM: CT CHEST, ABDOMEN AND PELVIS WITHOUT CONTRAST  TECHNIQUE: Multidetector CT imaging of the chest, abdomen and pelvis was performed following the standard protocol without IV contrast.  COMPARISON: Prior examinations 11/01/2012 and 07/19/2013.  FINDINGS: CT CHEST FINDINGS  Mediastinum/Nodes: There are no enlarged mediastinal, hilar or axillary lymph nodes. Hilar evaluation is mildly limited by the lack of contrast. The hilar contours appear unchanged. The thyroid gland, trachea and esophagus demonstrate no significant findings. The heart size is normal. There is no pericardial effusion.Stable mild atherosclerosis of the aorta, great vessels and coronary arteries. There is stable mild dilatation of the ascending aorta to 3.8 cm.  Lungs/Pleura: There is no pleural effusion.Evidence of previous partial left lower lobe resection. There are enlarging pulmonary nodules bilaterally consistent with metastatic disease. Right upper lobe nodule measures 12 mm on image 19 (previously 10 mm). Right lower lobe metastases measure 10 mm on image 40, 8 mm on image 43 and 15 mm  on image 47. The largest previously measured 13 mm. Left basilar nodule which appears to be in the upper lobe measures 12 mm on image 44 (previously 10 mm. Additional smaller metastases are likely.  Musculoskeletal/Chest wall: No suspicious chest wall lesion or osseous abnormality. There are old rib fractures or thoracotomy defects bilaterally.  CT ABDOMEN AND PELVIS FINDINGS  Hepatobiliary: Multiple new low-density hepatic lesions, highly worrisome for metastatic disease. The largest lesions are in the right lobe, measuring 9 mm on image 56, 11 mm on image 58 and 11 mm on image 60. No evidence of gallstones, gallbladder wall thickening or biliary dilatation.  Pancreas: There is an approximately 2.2 cm mass involving the pancreatic body on image 61. The pancreatic tail is atrophied. There is pancreatic ductal dilatation within the tail.  Spleen: Normal in size without focal abnormality.  Adrenals/Urinary Tract: No evidence of adrenal mass.Previous right nephrectomy.  The nephrectomy bed appears normal. Partially calcified cryoablation is zone in the interpolar region of the left kidney is grossly stable. An adjacent low-density lesion measuring approximately 1.8 x 1.7 cm on image 62 is also grossly stable. However, there is concern of an enlarging slightly hyperdense lesion in the medial interpolar region, measuring 1.9 cm on image 67. No evidence of hydronephrosis or urinary tract calculus. The bladder demonstrates no significant findings.  Stomach/Bowel: No evidence of bowel wall thickening, distention or surrounding inflammatory change.Moderate stool in the distal colon. No ascites or definite peritoneal nodularity.  Vascular/Lymphatic: There are no enlarged abdominal or pelvic lymph nodes. Stable aortoiliac atherosclerosis.  Reproductive: The uterus and ovaries appear stable. No adnexal mass.  Other: No evidence of abdominal wall mass or  hernia.  Musculoskeletal: New biconcave compression fracture at L3 is associated with irregular sclerosis and lucency in the vertebral body, most likely representing a subacute benign fracture. There is mild osseous retropulsion. No gross bone destruction or associated epidural soft tissue mass to suggest metastatic disease. Previous bilateral proximal femoral ORIF.  IMPRESSION: 1. Progressive pulmonary metastatic disease. 2. Additional metastatic disease within the liver and pancreas. 3. Enlarging mass medially in the left kidney worrisome for new renal cell carcinoma or metastasis. 4. L3 compression fracture, favored to be benign. No definite osseous metastases.   Impression and Plan:  This is a pleasant 81 year old female with the following issues: 1. Stage IV renal cell carcinoma, clear cell histology resected initially in 2001.  She had a metastasectomy in 2007 for a lung lesion.  She is also S/P Cryoablation of a Left renal mass. She also has documented pancreatic lesion biopsy proven to be renal in etiology. CT scans from 07/19/2014  showed progression of disease especially with pulmonary metastasis, adrenal metastasis and possible hepatic metastasis.  These findings were reviewed again with the patient and her family. Options of treatment were reviewed as well. Anticancer treatment with oral targeted therapy is certainly an option but I fear that she will tolerat it poorly. Despite the fact that she has stage IV disease, she is asymptomatic. Side effects associated with these medications will probably be worse than any symptoms she is experiencing right now. After risks associated with these medications cannot justify the risk. She understands there is no cure for her malignancy at this time. I have continued to recommend supportive management only. 2. Depression.  She is doing better from that. Her mood is certainly improved. 3. Weight loss.  She gained 2 pounds since the last visit  her appetite remains appropriate. Most of her problem is forgetting to eat related to her dementia and geriatric syndrome. 4.         Left Kidney mass: I doubt any local therapy can be done any more. Continued observation for now. 5.         Follow up: in 3 months.   Zola Button, MD 9/1/20161:15 PM

## 2015-01-04 NOTE — Telephone Encounter (Signed)
Gave adn printed appt sched and avs for pt for DEC

## 2015-04-01 ENCOUNTER — Inpatient Hospital Stay (HOSPITAL_COMMUNITY): Payer: PPO

## 2015-04-01 ENCOUNTER — Emergency Department (HOSPITAL_COMMUNITY): Payer: PPO

## 2015-04-01 ENCOUNTER — Inpatient Hospital Stay (HOSPITAL_COMMUNITY)
Admission: EM | Admit: 2015-04-01 | Discharge: 2015-04-04 | DRG: 463 | Disposition: A | Discharge status: Skilled Nursing Facility | Payer: PPO | Attending: Internal Medicine | Admitting: Internal Medicine

## 2015-04-01 ENCOUNTER — Encounter (HOSPITAL_COMMUNITY): Payer: Self-pay

## 2015-04-01 DIAGNOSIS — Z79899 Other long term (current) drug therapy: Secondary | ICD-10-CM

## 2015-04-01 DIAGNOSIS — H9190 Unspecified hearing loss, unspecified ear: Secondary | ICD-10-CM | POA: Diagnosis present

## 2015-04-01 DIAGNOSIS — C7889 Secondary malignant neoplasm of other digestive organs: Secondary | ICD-10-CM | POA: Diagnosis present

## 2015-04-01 DIAGNOSIS — W0110XA Fall on same level from slipping, tripping and stumbling with subsequent striking against unspecified object, initial encounter: Secondary | ICD-10-CM | POA: Diagnosis present

## 2015-04-01 DIAGNOSIS — C642 Malignant neoplasm of left kidney, except renal pelvis: Secondary | ICD-10-CM | POA: Diagnosis present

## 2015-04-01 DIAGNOSIS — F0390 Unspecified dementia without behavioral disturbance: Secondary | ICD-10-CM | POA: Diagnosis present

## 2015-04-01 DIAGNOSIS — D638 Anemia in other chronic diseases classified elsewhere: Secondary | ICD-10-CM | POA: Diagnosis present

## 2015-04-01 DIAGNOSIS — C649 Malignant neoplasm of unspecified kidney, except renal pelvis: Secondary | ICD-10-CM | POA: Diagnosis present

## 2015-04-01 DIAGNOSIS — C7931 Secondary malignant neoplasm of brain: Secondary | ICD-10-CM | POA: Diagnosis present

## 2015-04-01 DIAGNOSIS — D62 Acute posthemorrhagic anemia: Secondary | ICD-10-CM | POA: Diagnosis not present

## 2015-04-01 DIAGNOSIS — C641 Malignant neoplasm of right kidney, except renal pelvis: Secondary | ICD-10-CM | POA: Diagnosis not present

## 2015-04-01 DIAGNOSIS — S7290XA Unspecified fracture of unspecified femur, initial encounter for closed fracture: Secondary | ICD-10-CM | POA: Diagnosis present

## 2015-04-01 DIAGNOSIS — D509 Iron deficiency anemia, unspecified: Secondary | ICD-10-CM | POA: Diagnosis not present

## 2015-04-01 DIAGNOSIS — C3491 Malignant neoplasm of unspecified part of right bronchus or lung: Secondary | ICD-10-CM | POA: Diagnosis not present

## 2015-04-01 DIAGNOSIS — M979XXA Periprosthetic fracture around unspecified internal prosthetic joint, initial encounter: Secondary | ICD-10-CM | POA: Diagnosis present

## 2015-04-01 DIAGNOSIS — S0101XA Laceration without foreign body of scalp, initial encounter: Secondary | ICD-10-CM | POA: Diagnosis present

## 2015-04-01 DIAGNOSIS — G939 Disorder of brain, unspecified: Secondary | ICD-10-CM | POA: Diagnosis not present

## 2015-04-01 DIAGNOSIS — C3490 Malignant neoplasm of unspecified part of unspecified bronchus or lung: Secondary | ICD-10-CM | POA: Diagnosis present

## 2015-04-01 DIAGNOSIS — Y92002 Bathroom of unspecified non-institutional (private) residence single-family (private) house as the place of occurrence of the external cause: Secondary | ICD-10-CM | POA: Diagnosis not present

## 2015-04-01 DIAGNOSIS — G936 Cerebral edema: Secondary | ICD-10-CM | POA: Diagnosis present

## 2015-04-01 DIAGNOSIS — F329 Major depressive disorder, single episode, unspecified: Secondary | ICD-10-CM | POA: Diagnosis present

## 2015-04-01 DIAGNOSIS — Z419 Encounter for procedure for purposes other than remedying health state, unspecified: Secondary | ICD-10-CM

## 2015-04-01 DIAGNOSIS — S72341A Displaced spiral fracture of shaft of right femur, initial encounter for closed fracture: Secondary | ICD-10-CM | POA: Diagnosis present

## 2015-04-01 DIAGNOSIS — Z01818 Encounter for other preprocedural examination: Secondary | ICD-10-CM

## 2015-04-01 DIAGNOSIS — D649 Anemia, unspecified: Secondary | ICD-10-CM | POA: Diagnosis present

## 2015-04-01 DIAGNOSIS — S7291XA Unspecified fracture of right femur, initial encounter for closed fracture: Secondary | ICD-10-CM | POA: Insufficient documentation

## 2015-04-01 DIAGNOSIS — Z09 Encounter for follow-up examination after completed treatment for conditions other than malignant neoplasm: Secondary | ICD-10-CM

## 2015-04-01 DIAGNOSIS — S72001G Fracture of unspecified part of neck of right femur, subsequent encounter for closed fracture with delayed healing: Secondary | ICD-10-CM | POA: Diagnosis not present

## 2015-04-01 DIAGNOSIS — Z85528 Personal history of other malignant neoplasm of kidney: Secondary | ICD-10-CM | POA: Diagnosis not present

## 2015-04-01 DIAGNOSIS — M79604 Pain in right leg: Secondary | ICD-10-CM | POA: Diagnosis present

## 2015-04-01 HISTORY — DX: Unspecified fracture of unspecified femur, initial encounter for closed fracture: S72.90XA

## 2015-04-01 LAB — URINALYSIS, ROUTINE W REFLEX MICROSCOPIC
BILIRUBIN URINE: NEGATIVE
Glucose, UA: NEGATIVE mg/dL
HGB URINE DIPSTICK: NEGATIVE
Ketones, ur: 15 mg/dL — AB
Leukocytes, UA: NEGATIVE
NITRITE: NEGATIVE
PROTEIN: NEGATIVE mg/dL
Specific Gravity, Urine: 1.018 (ref 1.005–1.030)
pH: 6 (ref 5.0–8.0)

## 2015-04-01 LAB — CBC
HCT: 28.2 % — ABNORMAL LOW (ref 36.0–46.0)
Hemoglobin: 9.2 g/dL — ABNORMAL LOW (ref 12.0–15.0)
MCH: 28.9 pg (ref 26.0–34.0)
MCHC: 32.6 g/dL (ref 30.0–36.0)
MCV: 88.7 fL (ref 78.0–100.0)
PLATELETS: 230 10*3/uL (ref 150–400)
RBC: 3.18 MIL/uL — ABNORMAL LOW (ref 3.87–5.11)
RDW: 13 % (ref 11.5–15.5)
WBC: 8 10*3/uL (ref 4.0–10.5)

## 2015-04-01 LAB — BASIC METABOLIC PANEL
Anion gap: 7 (ref 5–15)
BUN: 19 mg/dL (ref 6–20)
CALCIUM: 8.3 mg/dL — AB (ref 8.9–10.3)
CO2: 22 mmol/L (ref 22–32)
CREATININE: 0.96 mg/dL (ref 0.44–1.00)
Chloride: 105 mmol/L (ref 101–111)
GFR calc Af Amer: >60 mL/min (ref >60)
GFR calc non Af Amer: 54 mL/min — ABNORMAL LOW (ref >60)
Glucose, Bld: 134 mg/dL — ABNORMAL HIGH (ref 65–99)
Potassium: 4 mmol/L (ref 3.5–5.1)
SODIUM: 134 mmol/L — AB (ref 135–145)

## 2015-04-01 LAB — CBC WITH DIFFERENTIAL/PLATELET
BASOS PCT: 0 %
Basophils Absolute: 0 10*3/uL (ref 0.0–0.1)
EOS ABS: 0 10*3/uL (ref 0.0–0.7)
EOS PCT: 0 %
HCT: 27.8 % — ABNORMAL LOW (ref 36.0–46.0)
HEMOGLOBIN: 8.9 g/dL — AB (ref 12.0–15.0)
LYMPHS ABS: 0.3 10*3/uL — AB (ref 0.7–4.0)
Lymphocytes Relative: 4 %
MCH: 28.6 pg (ref 26.0–34.0)
MCHC: 32 g/dL (ref 30.0–36.0)
MCV: 89.4 fL (ref 78.0–100.0)
MONOS PCT: 3 %
Monocytes Absolute: 0.3 10*3/uL (ref 0.1–1.0)
Neutro Abs: 7.5 10*3/uL (ref 1.7–7.7)
Neutrophils Relative %: 93 %
PLATELETS: 224 10*3/uL (ref 150–400)
RBC: 3.11 MIL/uL — ABNORMAL LOW (ref 3.87–5.11)
RDW: 13 % (ref 11.5–15.5)
WBC: 8.2 10*3/uL (ref 4.0–10.5)

## 2015-04-01 LAB — PROTIME-INR
INR: 1.33 (ref 0.00–1.49)
Prothrombin Time: 16.6 seconds — ABNORMAL HIGH (ref 11.6–15.2)

## 2015-04-01 MED ORDER — DOCUSATE SODIUM 100 MG PO CAPS
100.0000 mg | ORAL_CAPSULE | Freq: Two times a day (BID) | ORAL | Status: DC
  Administered 2015-04-01 – 2015-04-02 (×3): 100 mg via ORAL
  Filled 2015-04-01 (×2): qty 1

## 2015-04-01 MED ORDER — SODIUM CHLORIDE 0.9 % IV SOLN
INTRAVENOUS | Status: DC
  Administered 2015-04-01: 17:00:00 via INTRAVENOUS

## 2015-04-01 MED ORDER — BUPROPION HCL ER (XL) 150 MG PO TB24
150.0000 mg | ORAL_TABLET | Freq: Every day | ORAL | Status: DC
  Administered 2015-04-01 – 2015-04-04 (×4): 150 mg via ORAL
  Filled 2015-04-01 (×4): qty 1

## 2015-04-01 MED ORDER — HYDROCODONE-ACETAMINOPHEN 5-325 MG PO TABS: 1.0000 | ORAL_TABLET | ORAL | Status: DC | PRN

## 2015-04-01 MED ORDER — HYDROCODONE-ACETAMINOPHEN 5-325 MG PO TABS: 1.0000 | ORAL_TABLET | ORAL | Status: AC | PRN

## 2015-04-01 MED ORDER — DEXAMETHASONE 4 MG PO TABS
8.0000 mg | ORAL_TABLET | Freq: Three times a day (TID) | ORAL | Status: DC
  Administered 2015-04-01 – 2015-04-03 (×3): 8 mg via ORAL
  Filled 2015-04-01 (×2): qty 2

## 2015-04-01 MED ORDER — ONDANSETRON HCL 4 MG PO TABS: 4.0000 mg | ORAL_TABLET | Freq: Four times a day (QID) | ORAL | Status: DC | PRN

## 2015-04-01 MED ORDER — SODIUM CHLORIDE 0.9 % IV SOLN: INTRAVENOUS | Status: DC

## 2015-04-01 MED ORDER — ONDANSETRON HCL 4 MG/2ML IJ SOLN
4.0000 mg | Freq: Once | INTRAMUSCULAR | Status: AC
  Administered 2015-04-01: 4 mg via INTRAVENOUS
  Filled 2015-04-01: qty 2

## 2015-04-01 MED ORDER — ACETAMINOPHEN 650 MG RE SUPP: 650.0000 mg | Freq: Four times a day (QID) | RECTAL | Status: DC | PRN

## 2015-04-01 MED ORDER — DULOXETINE HCL 30 MG PO CPEP
30.0000 mg | ORAL_CAPSULE | Freq: Every day | ORAL | Status: DC
  Administered 2015-04-01 – 2015-04-04 (×4): 30 mg via ORAL
  Filled 2015-04-01 (×4): qty 1

## 2015-04-01 MED ORDER — HYDROMORPHONE HCL 1 MG/ML IJ SOLN
0.5000 mg | Freq: Once | INTRAMUSCULAR | Status: DC
  Filled 2015-04-01: qty 1

## 2015-04-01 MED ORDER — DEXAMETHASONE 4 MG PO TABS: 8.0000 mg | ORAL_TABLET | Freq: Three times a day (TID) | ORAL | Status: DC

## 2015-04-01 MED ORDER — HYDROMORPHONE HCL 1 MG/ML IJ SOLN
0.5000 mg | INTRAMUSCULAR | Status: DC | PRN
  Administered 2015-04-01 – 2015-04-02 (×2): 0.5 mg via INTRAVENOUS
  Filled 2015-04-01 (×2): qty 1

## 2015-04-01 MED ORDER — ONDANSETRON HCL 4 MG/2ML IJ SOLN: 4.0000 mg | Freq: Four times a day (QID) | INTRAMUSCULAR | Status: DC | PRN

## 2015-04-01 MED ORDER — ACETAMINOPHEN 325 MG PO TABS: 650.0000 mg | ORAL_TABLET | Freq: Four times a day (QID) | ORAL | Status: DC | PRN

## 2015-04-01 MED ORDER — MEGESTROL ACETATE 400 MG/10ML PO SUSP
400.0000 mg | Freq: Two times a day (BID) | ORAL | Status: DC
  Administered 2015-04-01 – 2015-04-04 (×6): 400 mg via ORAL
  Filled 2015-04-01 (×7): qty 10

## 2015-04-01 MED ORDER — PANTOPRAZOLE SODIUM 40 MG PO TBEC: 40.0000 mg | DELAYED_RELEASE_TABLET | Freq: Every day | ORAL | Status: DC

## 2015-04-01 MED ORDER — PANTOPRAZOLE SODIUM 40 MG IV SOLR
40.0000 mg | INTRAVENOUS | Status: DC
  Administered 2015-04-01 – 2015-04-03 (×2): 40 mg via INTRAVENOUS
  Filled 2015-04-01 (×2): qty 40

## 2015-04-01 MED ORDER — TRAZODONE HCL 50 MG PO TABS: 25.0000 mg | ORAL_TABLET | Freq: Every evening | ORAL | Status: DC | PRN

## 2015-04-01 MED ORDER — DEXTROSE 5 % IV SOLN
500.0000 mg | Freq: Four times a day (QID) | INTRAVENOUS | Status: DC | PRN
  Filled 2015-04-01: qty 5

## 2015-04-01 MED ORDER — ONDANSETRON HCL 4 MG/2ML IJ SOLN: 4.0000 mg | Freq: Three times a day (TID) | INTRAMUSCULAR | Status: DC | PRN

## 2015-04-01 NOTE — ED Provider Notes (Signed)
LACERATION REPAIR Performed by: Forde Dandy Authorized by: Forde Dandy Consent: Verbal consent obtained. Risks and benefits: risks, benefits and alternatives were discussed Consent given by: patient Patient identity confirmed: provided demographic data Prepped and Draped in normal sterile fashion Wound explored  Laceration Location: left side of scalp  Laceration Length: 2 cm  No Foreign Bodies seen or palpated    Irrigation method: syringe Amount of cleaning: standard  Skin closure: staples  Number of sutures: 2  Patient tolerance: Patient tolerated the procedure well with no immediate complications.    Please see previous physicians note regarding patient's presenting history and physical, initial ED course, and associated MDM. Patient has a scalp laceration that was repaired with staples. Also has a negative CT cervical spine from her mechanical fall, and cervical collar was cleared. She is transfered to her inpatient bed in stable condition.  Forde Dandy, MD 04/01/15 [phone]

## 2015-04-01 NOTE — H&P (Signed)
[facility] History and Physical  [name] PPJ:[id number] DOB: 07-08-1932 DOA: 04/01/2015  Referring physician: [place] PCP: Myrlene Broker, MD   Chief Complaint: mechanical fall. Right leg pain  HPI: [name] is a very pleasant 81 y.o. female with a past medical history that includes renal cell carcinoma with lung metastases, anemia, depression presents to the emergency department from home after a mechanical fall with chief complaint right leg pain. Initial evaluation in the emergency department reveals right femur fracture.  Information obtained from the patient who reports getting up this morning sitting on the side of bed and getting her feet tangled in the sheet when she got up to go the bathroom. She fell she hit her head. She denies losing consciousness. She reports feeling immediate pain in her right leg and was unable to get up. She was noted to have an obvious right femur fracture at the scene and placed in traction. She denies any chest pain palpitation headache dizziness syncope or near-syncope. She denies any recent illness fever chills diarrhea nausea vomiting sick contacts. She reports maintaining oral intake.  Workup in the emergency department includes x-ray of right leg there is displaced spiral fracture of mid shaft of the right femur below the level of intra medullary rod. Metallic fixation pin and intra medullary rod is noted in proximal right femur. There is no hip dislocation. Lab work reveals an INR within the limits of normal CBC with a hemoglobin of 8.9 hematocrit 27.8 down from 12.52 months ago, basic metabolic panel was a sodium of 134 serum glucose 134.  She is afebrile and hemodynamically stable while in the emergency department. She is provided with Buck's traction, knee immobilize.    Review of Systems:  10 point review of systems complete and all systems are negative except as indicated in the history of present illness Past  Medical History  Diagnosis Date  . Depression   . Nausea   . Metastatic lung cancer   . Anemia   . History of colon polyps   . Renal cell carcinoma dx'd 2001    right   . Renal cancer ([place]) dx'd 09/2010    left  . Femur fracture [facility])     right   Past Surgical History  Procedure Laterality Date  . Nephrectomy      right  . Rotator cuff repair      right  . Foot surgery    . Lung lobectomy    . Total hip arthroplasty    . Eus  04/10/2011    Procedure: UPPER ENDOSCOPIC ULTRASOUND (EUS) LINEAR;  Surgeon: Owens Loffler, MD;  Location: [facility] ENDOSCOPY;  Service: Endoscopy;  Laterality: N/A;   Social History:  reports that she has never smoked. She has never used smokeless tobacco. She reports that she does not drink alcohol or use illicit drugs. She lives at home alone she has close family support she stopped driving about a month ago no recent falls other than today No Known Allergies  Family History  Problem Relation Age of Onset  . Breast cancer Sister     x 3   . Colon cancer Sister     and Brother  . Heart disease Brother   . Heart attack Brother     and Sister   . Colon polyps Sister     and Brother    Prior to Admission medications   Medication Sig Start Date End Date Taking? Authorizing Provider  buPROPion (WELLBUTRIN XL) 150 MG  24 hr tablet Take 150 mg by mouth daily.   Yes Historical Provider, MD  CALCIUM-VITAMIN D PO Take 1 tablet by mouth daily.   Yes Historical Provider, MD  DULoxetine (CYMBALTA) 30 MG capsule Take 30 mg by mouth daily. 09/24/12  Yes Historical Provider, MD  Multiple Vitamin (MULTIVITAMIN WITH MINERALS) TABS tablet Take 1 tablet by mouth daily.   Yes Historical Provider, MD  Omega-3 Fatty Acids (FISH OIL) 1000 MG CAPS Take 1,000 mg by mouth 2 (two) times daily.   Yes Historical Provider, MD  megestrol (MEGACE) 400 MG/10ML suspension Take 10 mLs (400 mg total) by mouth 2 (two) times daily. 07/26/14   Wyatt Portela, MD   Physical Exam: Filed  Vitals:   04/01/15 1433 04/01/15 1434 04/01/15 1530 04/01/15 1600  BP:  116/56 109/56 110/55  Pulse:  72 66 65  Temp: 97.5 F (36.4 C)     TempSrc: Oral     Resp:  '16 18 17  '$ SpO2:  98% 100% 99%    Wt Readings from Last 3 Encounters:  01/04/15 44.316 kg (97 lb 11.2 oz)  11/24/14 43.908 kg (96 lb 12.8 oz)  07/26/14 44.951 kg (99 lb 1.6 oz)    General:  Appears anxious and somewhat uncomfortable Eyes: PERRL, normal lids, irises & conjunctiva ENT: grossly normal hearing, mucous membranes of her mouth are slightly pale very dry Neck: no LAD, masses or thyromegaly Cardiovascular: RRR, no m/r/g. No LE edema. Right foot warm to touch pedal pulses present and palpable Telemetry: SR, no arrhythmias  Respiratory: CTA bilaterally, no w/r/r. Normal respiratory effort somewhat shallow Abdomen: soft, ntnd positive bowel sounds no guarding Skin: no rash or induration seen on limited exam Musculoskeletal: grossly normal tone BUE/BLE, right leg shorter and somewhat internally rotated with external traction and Buck's traction. Right foot warm to touch. Psychiatric: grossly normal mood and affect, speech fluent and appropriate Neurologic: grossly non-focal. speech clear facial symmetry able to follow commands           Labs on Admission:  Basic Metabolic Panel:  Recent Labs Lab 04/01/15 1456  NA 134*  K 4.0  CL 105  CO2 22  GLUCOSE 134*  BUN 19  CREATININE 0.96  CALCIUM 8.3*   Liver Function Tests: No results for input(s): AST, ALT, ALKPHOS, BILITOT, PROT, ALBUMIN in the last 168 hours. No results for input(s): LIPASE, AMYLASE in the last 168 hours. No results for input(s): AMMONIA in the last 168 hours. CBC:  Recent Labs Lab 04/01/15 1456  WBC 8.2  NEUTROABS 7.5  HGB 8.9*  HCT 27.8*  MCV 89.4  PLT 224   Cardiac Enzymes: No results for input(s): CKTOTAL, CKMB, CKMBINDEX, TROPONINI in the last 168 hours.  BNP (last 3 results) No results for input(s): BNP in the last  8760 hours.  ProBNP (last 3 results) No results for input(s): PROBNP in the last 8760 hours.  CBG: No results for input(s): GLUCAP in the last 168 hours.  Radiological Exams on Admission: Ct Head Wo Contrast  04/01/2015  CLINICAL DATA:  81 year old female with history of trauma from a fall this morning with laceration to the right temporal region. Head and neck pain. History of renal cell carcinoma status post nephrectomy with known metastatic disease. EXAM: CT HEAD WITHOUT CONTRAST CT CERVICAL SPINE WITHOUT CONTRAST TECHNIQUE: Multidetector CT imaging of the head and cervical spine was performed following the standard protocol without intravenous contrast. Multiplanar CT image reconstructions of the cervical spine were also generated. COMPARISON:  No priors. FINDINGS: CT HEAD FINDINGS There several high attenuation lesions throughout the brain, concerning for hemorrhagic metastases. These include a 6 mm lesion in the right frontal lobe (image 14 of series 2), an 8 mm lesion in the inferior left frontal lobe (image 11 of series 2), a 7 x 9 mm lesion in the left occipital lobe (image 17 of series 2), and a 5 x 7 mm lesion in the superior aspect of the right cerebellar hemisphere (image 11 of series 2). These are surrounded by areas of low attenuation, presumably some associated vasogenic edema. Patchy and confluent areas of decreased attenuation are noted throughout the deep and periventricular white matter of the cerebral hemispheres bilaterally, compatible with chronic microvascular ischemic disease. Mild cerebral atrophy. No definite acute intra-axial or extra-axial hemorrhage. No hydrocephalus. No acute displaced skull fractures. Visualized paranasal sinuses and mastoids are well pneumatized. CT CERVICAL SPINE FINDINGS No acute displaced fracture of the cervical spine. Alignment is anatomic. Prevertebral soft tissues are normal. Multilevel degenerative disc disease, most pronounced at C3-C4, C4-C5,  C5-C6 and C6-C7. Mild multilevel facet arthropathy. Visualized portions of the upper thorax demonstrate some bilateral apical pleuroparenchymal thickening, most compatible with chronic post infectious or inflammatory scarring. IMPRESSION: 1. No signs of significant acute traumatic injury to the skull, brain or cervical spine. 2. However, there are 4 small high attenuation lesions in the brain, as detailed above, surrounded by extensive vasogenic edema, presumably multifocal metastatic lesions in this patient with known metastatic renal cell carcinoma. 3. Chronic microvascular ischemic changes and mild cerebral atrophy also noted. 4. Multilevel degenerative disc disease and cervical spondylosis, as above. Electronically Signed   By: Vinnie Langton M.D.   On: 04/01/2015 14:54   Ct Cervical Spine Wo Contrast  04/01/2015  CLINICAL DATA:  81 year old female with history of trauma from a fall this morning with laceration to the right temporal region. Head and neck pain. History of renal cell carcinoma status post nephrectomy with known metastatic disease. EXAM: CT HEAD WITHOUT CONTRAST CT CERVICAL SPINE WITHOUT CONTRAST TECHNIQUE: Multidetector CT imaging of the head and cervical spine was performed following the standard protocol without intravenous contrast. Multiplanar CT image reconstructions of the cervical spine were also generated. COMPARISON:  No priors. FINDINGS: CT HEAD FINDINGS There several high attenuation lesions throughout the brain, concerning for hemorrhagic metastases. These include a 6 mm lesion in the right frontal lobe (image 14 of series 2), an 8 mm lesion in the inferior left frontal lobe (image 11 of series 2), a 7 x 9 mm lesion in the left occipital lobe (image 17 of series 2), and a 5 x 7 mm lesion in the superior aspect of the right cerebellar hemisphere (image 11 of series 2). These are surrounded by areas of low attenuation, presumably some associated vasogenic edema. Patchy and  confluent areas of decreased attenuation are noted throughout the deep and periventricular white matter of the cerebral hemispheres bilaterally, compatible with chronic microvascular ischemic disease. Mild cerebral atrophy. No definite acute intra-axial or extra-axial hemorrhage. No hydrocephalus. No acute displaced skull fractures. Visualized paranasal sinuses and mastoids are well pneumatized. CT CERVICAL SPINE FINDINGS No acute displaced fracture of the cervical spine. Alignment is anatomic. Prevertebral soft tissues are normal. Multilevel degenerative disc disease, most pronounced at C3-C4, C4-C5, C5-C6 and C6-C7. Mild multilevel facet arthropathy. Visualized portions of the upper thorax demonstrate some bilateral apical pleuroparenchymal thickening, most compatible with chronic post infectious or inflammatory scarring. IMPRESSION: 1. No signs of significant acute traumatic injury  to the skull, brain or cervical spine. 2. However, there are 4 small high attenuation lesions in the brain, as detailed above, surrounded by extensive vasogenic edema, presumably multifocal metastatic lesions in this patient with known metastatic renal cell carcinoma. 3. Chronic microvascular ischemic changes and mild cerebral atrophy also noted. 4. Multilevel degenerative disc disease and cervical spondylosis, as above. Electronically Signed   By: Vinnie Langton M.D.   On: 04/01/2015 14:54   Dg Femur 1v Right  04/01/2015  CLINICAL DATA:  Fall this morning EXAM: RIGHT FEMUR 1 VIEW COMPARISON:  None. FINDINGS: Two views of the right femur submitted. There is displaced spiral fracture of mid shaft of the right femur below the level of intra medullary rod. Metallic fixation pin and intra medullary rod is noted in proximal right femur. There is no hip dislocation. Diffuse osteopenia. IMPRESSION: There is displaced spiral fracture of mid shaft of the right femur below the level of intra medullary rod. Metallic fixation pin and intra  medullary rod is noted in proximal right femur. There is no hip dislocation. Electronically Signed   By: Lahoma Crocker M.D.   On: 04/01/2015 14:27    EKG: Pending on admission  Assessment/Plan Principal Problem:   Femur fracture, right (HCC) Active Problems:   Renal cell cancer ([place])   Primary malignant neoplasm of lung metastatic to other site [facility])   Anemia   Brain lesion   Femur fracture ([place])   #1. Femur fracture. Related to mechanical fall. -Admit to MedSurg -Orthopedics consulted in the emergency department we'll see today -Pain management -Continue knee immobilizer and traction as requested -We'll keep her nothing by mouth past midnight -We'll obtain EKG chest x-ray urinalysis -No history of CAD hypertension  #2. Anemia. Hemoglobin 8.9 on admission. Chart review indicates greater than 12 2 months ago -No obvious hematoma - obtain serial CBCs ` -She's been typed and crossed -Will likely need transfusion  #3. 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 -will notify oncology of admission -consider decedron  #4. Stage IV renal cell carcinoma. Oncology note dated September 2016 indicates CT scans from March 2016 showed progression of disease especially with pulmonary metastases, adrenal metastases and possible hepatic metastases. Recommending supportive management only. See oncology note dated 01/04/2015 for details   Code Status: limited DVT Prophylaxis: Family Communication: son and grand-daughter Disposition Plan: will need placement. Consider palliative care consult as well  Time spent: 75 minutes  [facility]

## 2015-04-01 NOTE — ED Notes (Signed)
Re-paged Antionette Char x2 to [phone]

## 2015-04-01 NOTE — ED Notes (Signed)
[facility] at bedside

## 2015-04-01 NOTE — ED Notes (Signed)
Patient returned from Radiology. 

## 2015-04-01 NOTE — Progress Notes (Signed)
Orthopedic Tech Progress Note Patient Details:  [name] October 13, 1932 [id number]  Musculoskeletal Traction Type of Traction: Bucks Skin Traction Traction Location: RLE Traction Weight: 5 lbs    Braulio Bosch 04/01/2015, 7:22 PM

## 2015-04-01 NOTE — ED Notes (Signed)
Pt. Presents with complaint of mechanical fall this AM. Pt. Lives at home alone. Pt. States she was going to the bathroom this AM when she tripped. She denies LOC/dizziness/weakness during event. EMS reports obvious R femur fracture, pt. Placed in traction. EMS unable to locate pulse in R foot, states pt. Is able to feel touch in R foot. Pt. Given 175 mcg fentanyl in route. EMS states pt. Oral temp was 92.8, pt. Given 500 ml warmed NS in route. Pt. AxO x4.

## 2015-04-01 NOTE — Consult Note (Signed)
Reason for Consult: Right femur fracture Referring Physician: Dr. Cleophas Dunker is an 81 y.o. female.  HPI: Patient with history of Renal cancer with lung mets, see today following a fall this am. She tripped in her bathroom at home, she lives independently. She had immediate pain to her right femur region and knee. EMS was called. She was immolibized with traction by EMS. Found to be grossly N/V intact. She was then brought to [facility] for further eval. Imaging revealed a right displaced spiral fracture distal to her IM nail. The IM nail was placed by Dr.Olin in 2010. Denies CP,SOB, dizziness, or syncope. Reports she does not take anticoagulates.  Past Medical History  Diagnosis Date  . Depression   . Nausea   . Metastatic lung cancer   . Anemia   . History of colon polyps   . Renal cell carcinoma dx'd 2001    right   . Renal cancer ([place]) dx'd 09/2010    left  . Femur fracture [facility])     right    Past Surgical History  Procedure Laterality Date  . Nephrectomy      right  . Rotator cuff repair      right  . Foot surgery    . Lung lobectomy    . Total hip arthroplasty    . Eus  04/10/2011    Procedure: UPPER ENDOSCOPIC ULTRASOUND (EUS) LINEAR;  Surgeon: Owens Loffler, MD;  Location: [facility];  Service: Endoscopy;  Laterality: N/A;    Family History  Problem Relation Age of Onset  . Breast cancer Sister     x 3   . Colon cancer Sister     and Brother  . Heart disease Brother   . Heart attack Brother     and Sister   . Colon polyps Sister     and Brother    Social History:  reports that she has never smoked. She has never used smokeless tobacco. She reports that she does not drink alcohol or use illicit drugs.  Allergies: No Known Allergies  Medications: I have reviewed the patient's current medications.  Results for orders placed or performed during the hospital encounter of 04/01/15 (from the past 48 hour(s))  Type and screen [place]     Status: None   Collection Time: 04/01/15  1:55 PM  Result Value Ref Range   ABO/RH(D) A POS    Antibody Screen NEG    Sample Expiration 16/60/6301   Basic metabolic panel     Status: Abnormal   Collection Time: 04/01/15  2:56 PM  Result Value Ref Range   Sodium 134 (L) 135 - 145 mmol/L   Potassium 4.0 3.5 - 5.1 mmol/L   Chloride 105 101 - 111 mmol/L   CO2 22 22 - 32 mmol/L   Glucose, Bld 134 (H) 65 - 99 mg/dL   BUN 19 6 - 20 mg/dL   Creatinine, Ser 0.96 0.44 - 1.00 mg/dL   Calcium 8.3 (L) 8.9 - 10.3 mg/dL   GFR calc non Af Amer 54 (L) >60 mL/min   GFR calc Af Amer >60 >60 mL/min    Comment: (NOTE) The eGFR has been calculated using the CKD EPI equation. This calculation has not been validated in all clinical situations. eGFR's persistently <60 mL/min signify possible Chronic Kidney Disease.    Anion gap 7 5 - 15  CBC WITH DIFFERENTIAL     Status: Abnormal   Collection Time: 04/01/15  2:56 PM  Result Value Ref Range   WBC 8.2 4.0 - 10.5 K/uL   RBC 3.11 (L) 3.87 - 5.11 MIL/uL   Hemoglobin 8.9 (L) 12.0 - 15.0 g/dL   HCT 27.8 (L) 36.0 - 46.0 %   MCV 89.4 78.0 - 100.0 fL   MCH 28.6 26.0 - 34.0 pg   MCHC 32.0 30.0 - 36.0 g/dL   RDW 13.0 11.5 - 15.5 %   Platelets 224 150 - 400 K/uL   Neutrophils Relative % 93 %   Neutro Abs 7.5 1.7 - 7.7 K/uL   Lymphocytes Relative 4 %   Lymphs Abs 0.3 (L) 0.7 - 4.0 K/uL   Monocytes Relative 3 %   Monocytes Absolute 0.3 0.1 - 1.0 K/uL   Eosinophils Relative 0 %   Eosinophils Absolute 0.0 0.0 - 0.7 K/uL   Basophils Relative 0 %   Basophils Absolute 0.0 0.0 - 0.1 K/uL  Protime-INR     Status: Abnormal   Collection Time: 04/01/15  2:56 PM  Result Value Ref Range   Prothrombin Time 16.6 (H) 11.6 - 15.2 seconds   INR 1.33 0.00 - 1.49    Ct Head Wo Contrast  04/01/2015  CLINICAL DATA:  81 year old female with history of trauma from a fall this morning with laceration to the right temporal region. Head and neck pain. History of  renal cell carcinoma status post nephrectomy with known metastatic disease. EXAM: CT HEAD WITHOUT CONTRAST CT CERVICAL SPINE WITHOUT CONTRAST TECHNIQUE: Multidetector CT imaging of the head and cervical spine was performed following the standard protocol without intravenous contrast. Multiplanar CT image reconstructions of the cervical spine were also generated. COMPARISON:  No priors. FINDINGS: CT HEAD FINDINGS There several high attenuation lesions throughout the brain, concerning for hemorrhagic metastases. These include a 6 mm lesion in the right frontal lobe (image 14 of series 2), an 8 mm lesion in the inferior left frontal lobe (image 11 of series 2), a 7 x 9 mm lesion in the left occipital lobe (image 17 of series 2), and a 5 x 7 mm lesion in the superior aspect of the right cerebellar hemisphere (image 11 of series 2). These are surrounded by areas of low attenuation, presumably some associated vasogenic edema. Patchy and confluent areas of decreased attenuation are noted throughout the deep and periventricular white matter of the cerebral hemispheres bilaterally, compatible with chronic microvascular ischemic disease. Mild cerebral atrophy. No definite acute intra-axial or extra-axial hemorrhage. No hydrocephalus. No acute displaced skull fractures. Visualized paranasal sinuses and mastoids are well pneumatized. CT CERVICAL SPINE FINDINGS No acute displaced fracture of the cervical spine. Alignment is anatomic. Prevertebral soft tissues are normal. Multilevel degenerative disc disease, most pronounced at C3-C4, C4-C5, C5-C6 and C6-C7. Mild multilevel facet arthropathy. Visualized portions of the upper thorax demonstrate some bilateral apical pleuroparenchymal thickening, most compatible with chronic post infectious or inflammatory scarring. IMPRESSION: 1. No signs of significant acute traumatic injury to the skull, brain or cervical spine. 2. However, there are 4 small high attenuation lesions in the  brain, as detailed above, surrounded by extensive vasogenic edema, presumably multifocal metastatic lesions in this patient with known metastatic renal cell carcinoma. 3. Chronic microvascular ischemic changes and mild cerebral atrophy also noted. 4. Multilevel degenerative disc disease and cervical spondylosis, as above. Electronically Signed   By: Vinnie Langton M.D.   On: 04/01/2015 14:54   Ct Cervical Spine Wo Contrast  04/01/2015  CLINICAL DATA:  81 year old female with history of trauma from  a fall this morning with laceration to the right temporal region. Head and neck pain. History of renal cell carcinoma status post nephrectomy with known metastatic disease. EXAM: CT HEAD WITHOUT CONTRAST CT CERVICAL SPINE WITHOUT CONTRAST TECHNIQUE: Multidetector CT imaging of the head and cervical spine was performed following the standard protocol without intravenous contrast. Multiplanar CT image reconstructions of the cervical spine were also generated. COMPARISON:  No priors. FINDINGS: CT HEAD FINDINGS There several high attenuation lesions throughout the brain, concerning for hemorrhagic metastases. These include a 6 mm lesion in the right frontal lobe (image 14 of series 2), an 8 mm lesion in the inferior left frontal lobe (image 11 of series 2), a 7 x 9 mm lesion in the left occipital lobe (image 17 of series 2), and a 5 x 7 mm lesion in the superior aspect of the right cerebellar hemisphere (image 11 of series 2). These are surrounded by areas of low attenuation, presumably some associated vasogenic edema. Patchy and confluent areas of decreased attenuation are noted throughout the deep and periventricular white matter of the cerebral hemispheres bilaterally, compatible with chronic microvascular ischemic disease. Mild cerebral atrophy. No definite acute intra-axial or extra-axial hemorrhage. No hydrocephalus. No acute displaced skull fractures. Visualized paranasal sinuses and mastoids are well pneumatized.  CT CERVICAL SPINE FINDINGS No acute displaced fracture of the cervical spine. Alignment is anatomic. Prevertebral soft tissues are normal. Multilevel degenerative disc disease, most pronounced at C3-C4, C4-C5, C5-C6 and C6-C7. Mild multilevel facet arthropathy. Visualized portions of the upper thorax demonstrate some bilateral apical pleuroparenchymal thickening, most compatible with chronic post infectious or inflammatory scarring. IMPRESSION: 1. No signs of significant acute traumatic injury to the skull, brain or cervical spine. 2. However, there are 4 small high attenuation lesions in the brain, as detailed above, surrounded by extensive vasogenic edema, presumably multifocal metastatic lesions in this patient with known metastatic renal cell carcinoma. 3. Chronic microvascular ischemic changes and mild cerebral atrophy also noted. 4. Multilevel degenerative disc disease and cervical spondylosis, as above. Electronically Signed   By: Vinnie Langton M.D.   On: 04/01/2015 14:54   Dg Femur 1v Right  04/01/2015  CLINICAL DATA:  Fall this morning EXAM: RIGHT FEMUR 1 VIEW COMPARISON:  None. FINDINGS: Two views of the right femur submitted. There is displaced spiral fracture of mid shaft of the right femur below the level of intra medullary rod. Metallic fixation pin and intra medullary rod is noted in proximal right femur. There is no hip dislocation. Diffuse osteopenia. IMPRESSION: There is displaced spiral fracture of mid shaft of the right femur below the level of intra medullary rod. Metallic fixation pin and intra medullary rod is noted in proximal right femur. There is no hip dislocation. Electronically Signed   By: Lahoma Crocker M.D.   On: 04/01/2015 14:27    Review of Systems  Constitutional: Negative.   HENT: Negative.   Eyes: Negative.   Respiratory: Negative.   Cardiovascular: Negative.   Gastrointestinal: Negative.   Musculoskeletal: Positive for falls.       Right leg pain.Deformity noted to  mid thigh. Tender to touch mid thigh. Edema noted. Compartment soft. Pulses PT and DP intact. Found with Doppler.   Skin: Negative.   Neurological: Negative.   Endo/Heme/Allergies: Negative.   Psychiatric/Behavioral: Negative.    Blood pressure 110/55, pulse 65, temperature 97.5 F (36.4 C), temperature source Oral, resp. rate 17, SpO2 99 %. Physical Exam  Constitutional: She appears well-developed.  HENT:  Abrasion to  right side of head.  Eyes: EOM are normal.  Neck:  In collar  Cardiovascular: Normal rate and intact distal pulses.   Respiratory: Effort normal.  GI: Soft.  Genitourinary:  Deferred  Musculoskeletal:  Right leg pain.Deformity noted to mid thigh. Tender to touch mid thigh. Edema noted. Compartment soft. Pulses PT and DP intact. Found with Doppler. Sensation intact to touch in RLE.  Neurological: She is alert.  Skin: Skin is warm and dry.  Psychiatric: Her behavior is normal.    Assessment/Plan: Right displaced periprosthetic femur fracture: NPO [place] traction to RLE, 5lbs Knee immobilizer to remain in place Bedrest Dr. Alvan Dame to Eval in am Pain management Medicine to admit, Dr. Tyrell Antonio Medicine to contact her oncologist to update  Lajean Manes 04/01/2015, 5:25 PM

## 2015-04-01 NOTE — ED Provider Notes (Signed)
CSN: [id number]     Arrival date & time 04/01/15  1259 History   First MD Initiated Contact with Patient 04/01/15 1255     Chief Complaint  Patient presents with  . Leg Injury  . Fall   (Consider location/radiation/quality/duration/timing/severity/associated sxs/prior Treatment)  HPI  Patient is an 81 year old woman with history of renal cell carcinoma with lung metastasis who presents from home via EMS after suffering a fall. Patient reports that she was getting out of bed to go to the bathroom this morning when she suffered a fall. Reports that she got tripped. Denies any loss of consciousness and fully remembers the fall. Patient hit her head. Per EMS, patient was noted to have an obvious right femur fracture at the scene and she was placed in traction. Patient currently only complains of pain in her right leg.   Past Medical History  Diagnosis Date  . Depression   . Nausea   . Metastatic lung cancer   . Anemia   . History of colon polyps   . Renal cell carcinoma dx'd 2001    right   . Renal cancer ([place]) dx'd 09/2010    left   Past Surgical History  Procedure Laterality Date  . Nephrectomy      right  . Rotator cuff repair      right  . Foot surgery    . Lung lobectomy    . Total hip arthroplasty    . Eus  04/10/2011    Procedure: UPPER ENDOSCOPIC ULTRASOUND (EUS) LINEAR;  Surgeon: Owens Loffler, MD;  Location: [facility];  Service: Endoscopy;  Laterality: N/A;   Family History  Problem Relation Age of Onset  . Breast cancer Sister     x 3   . Colon cancer Sister     and Brother  . Heart disease Brother   . Heart attack Brother     and Sister   . Colon polyps Sister     and Brother   Social History  Substance Use Topics  . Smoking status: Never Smoker   . Smokeless tobacco: Never Used  . Alcohol Use: No   OB History    No data available     Review of Systems  Constitutional: Negative.   HENT: Negative.   Eyes: Negative.   Respiratory: Negative for  shortness of breath.   Cardiovascular: Negative for chest pain.  Gastrointestinal: Negative.   Endocrine: Negative.   Genitourinary: Negative.   Musculoskeletal: Positive for myalgias.  Allergic/Immunologic: Negative.   Neurological: Negative for syncope, weakness and numbness.  Hematological: Negative.   Psychiatric/Behavioral: Negative.       Allergies  Review of patient's allergies indicates no known allergies.  Home Medications   Prior to Admission medications   Medication Sig Start Date End Date Taking? Authorizing Provider  buPROPion (WELLBUTRIN XL) 150 MG 24 hr tablet Take 150 mg by mouth daily.   Yes Historical Provider, MD  CALCIUM-VITAMIN D PO Take 1 tablet by mouth daily.   Yes Historical Provider, MD  DULoxetine (CYMBALTA) 30 MG capsule Take 30 mg by mouth daily. 09/24/12  Yes Historical Provider, MD  Multiple Vitamin (MULTIVITAMIN WITH MINERALS) TABS tablet Take 1 tablet by mouth daily.   Yes Historical Provider, MD  Omega-3 Fatty Acids (FISH OIL) 1000 MG CAPS Take 1,000 mg by mouth 2 (two) times daily.   Yes Historical Provider, MD  megestrol (MEGACE) 400 MG/10ML suspension Take 10 mLs (400 mg total) by mouth 2 (two) times  daily. 07/26/14   Wyatt Portela, MD   BP 116/56 mmHg  Pulse 72  Temp(Src) 97.5 F (36.4 C) (Oral)  Resp 16  SpO2 98% Physical Exam  Constitutional: She is oriented to person, place, and time. She appears well-developed and well-nourished.  HENT:  Head: Normocephalic.    Eyes: EOM are normal. Pupils are equal, round, and reactive to light.  Neck: No spinous process tenderness present.  Cardiovascular: Normal rate, regular rhythm and normal heart sounds.   Pulmonary/Chest: Effort normal and breath sounds normal. No respiratory distress.  Abdominal: Soft. Bowel sounds are normal. She exhibits no distension.  Musculoskeletal:       Right upper leg: She exhibits tenderness and deformity.  Right upper leg with visible deformity. Skin intact.  Popliteal pulse 2+. Right foot cool to touch. Brisk cap refill. Sensation and motor function intact. DP pulse not palpable.   Neurological: She is alert and oriented to person, place, and time.  Skin: Skin is warm and dry. No rash noted.  Psychiatric: She has a normal mood and affect. Her behavior is normal.  Nursing note and vitals reviewed.   ED Course  Procedures (including critical care time) Labs Review Labs Reviewed  BASIC METABOLIC PANEL - Abnormal; Notable for the following:    Sodium 134 (*)    Glucose, Bld 134 (*)    Calcium 8.3 (*)    GFR calc non Af Amer 54 (*)    All other components within normal limits  CBC WITH DIFFERENTIAL/PLATELET - Abnormal; Notable for the following:    RBC 3.11 (*)    Hemoglobin 8.9 (*)    HCT 27.8 (*)    Lymphs Abs 0.3 (*)    All other components within normal limits  PROTIME-INR - Abnormal; Notable for the following:    Prothrombin Time 16.6 (*)    All other components within normal limits  TYPE AND SCREEN    Imaging Review Ct Head Wo Contrast  04/01/2015  CLINICAL DATA:  81 year old female with history of trauma from a fall this morning with laceration to the right temporal region. Head and neck pain. History of renal cell carcinoma status post nephrectomy with known metastatic disease. EXAM: CT HEAD WITHOUT CONTRAST CT CERVICAL SPINE WITHOUT CONTRAST TECHNIQUE: Multidetector CT imaging of the head and cervical spine was performed following the standard protocol without intravenous contrast. Multiplanar CT image reconstructions of the cervical spine were also generated. COMPARISON:  No priors. FINDINGS: CT HEAD FINDINGS There several high attenuation lesions throughout the brain, concerning for hemorrhagic metastases. These include a 6 mm lesion in the right frontal lobe (image 14 of series 2), an 8 mm lesion in the inferior left frontal lobe (image 11 of series 2), a 7 x 9 mm lesion in the left occipital lobe (image 17 of series 2), and a 5 x  7 mm lesion in the superior aspect of the right cerebellar hemisphere (image 11 of series 2). These are surrounded by areas of low attenuation, presumably some associated vasogenic edema. Patchy and confluent areas of decreased attenuation are noted throughout the deep and periventricular white matter of the cerebral hemispheres bilaterally, compatible with chronic microvascular ischemic disease. Mild cerebral atrophy. No definite acute intra-axial or extra-axial hemorrhage. No hydrocephalus. No acute displaced skull fractures. Visualized paranasal sinuses and mastoids are well pneumatized. CT CERVICAL SPINE FINDINGS No acute displaced fracture of the cervical spine. Alignment is anatomic. Prevertebral soft tissues are normal. Multilevel degenerative disc disease, most pronounced at C3-C4, C4-C5,  C5-C6 and C6-C7. Mild multilevel facet arthropathy. Visualized portions of the upper thorax demonstrate some bilateral apical pleuroparenchymal thickening, most compatible with chronic post infectious or inflammatory scarring. IMPRESSION: 1. No signs of significant acute traumatic injury to the skull, brain or cervical spine. 2. However, there are 4 small high attenuation lesions in the brain, as detailed above, surrounded by extensive vasogenic edema, presumably multifocal metastatic lesions in this patient with known metastatic renal cell carcinoma. 3. Chronic microvascular ischemic changes and mild cerebral atrophy also noted. 4. Multilevel degenerative disc disease and cervical spondylosis, as above. Electronically Signed   By: Vinnie Langton M.D.   On: 04/01/2015 14:54   Ct Cervical Spine Wo Contrast  04/01/2015  CLINICAL DATA:  81 year old female with history of trauma from a fall this morning with laceration to the right temporal region. Head and neck pain. History of renal cell carcinoma status post nephrectomy with known metastatic disease. EXAM: CT HEAD WITHOUT CONTRAST CT CERVICAL SPINE WITHOUT CONTRAST  TECHNIQUE: Multidetector CT imaging of the head and cervical spine was performed following the standard protocol without intravenous contrast. Multiplanar CT image reconstructions of the cervical spine were also generated. COMPARISON:  No priors. FINDINGS: CT HEAD FINDINGS There several high attenuation lesions throughout the brain, concerning for hemorrhagic metastases. These include a 6 mm lesion in the right frontal lobe (image 14 of series 2), an 8 mm lesion in the inferior left frontal lobe (image 11 of series 2), a 7 x 9 mm lesion in the left occipital lobe (image 17 of series 2), and a 5 x 7 mm lesion in the superior aspect of the right cerebellar hemisphere (image 11 of series 2). These are surrounded by areas of low attenuation, presumably some associated vasogenic edema. Patchy and confluent areas of decreased attenuation are noted throughout the deep and periventricular white matter of the cerebral hemispheres bilaterally, compatible with chronic microvascular ischemic disease. Mild cerebral atrophy. No definite acute intra-axial or extra-axial hemorrhage. No hydrocephalus. No acute displaced skull fractures. Visualized paranasal sinuses and mastoids are well pneumatized. CT CERVICAL SPINE FINDINGS No acute displaced fracture of the cervical spine. Alignment is anatomic. Prevertebral soft tissues are normal. Multilevel degenerative disc disease, most pronounced at C3-C4, C4-C5, C5-C6 and C6-C7. Mild multilevel facet arthropathy. Visualized portions of the upper thorax demonstrate some bilateral apical pleuroparenchymal thickening, most compatible with chronic post infectious or inflammatory scarring. IMPRESSION: 1. No signs of significant acute traumatic injury to the skull, brain or cervical spine. 2. However, there are 4 small high attenuation lesions in the brain, as detailed above, surrounded by extensive vasogenic edema, presumably multifocal metastatic lesions in this patient with known metastatic  renal cell carcinoma. 3. Chronic microvascular ischemic changes and mild cerebral atrophy also noted. 4. Multilevel degenerative disc disease and cervical spondylosis, as above. Electronically Signed   By: Vinnie Langton M.D.   On: 04/01/2015 14:54   Dg Femur 1v Right  04/01/2015  CLINICAL DATA:  Fall this morning EXAM: RIGHT FEMUR 1 VIEW COMPARISON:  None. FINDINGS: Two views of the right femur submitted. There is displaced spiral fracture of mid shaft of the right femur below the level of intra medullary rod. Metallic fixation pin and intra medullary rod is noted in proximal right femur. There is no hip dislocation. Diffuse osteopenia. IMPRESSION: There is displaced spiral fracture of mid shaft of the right femur below the level of intra medullary rod. Metallic fixation pin and intra medullary rod is noted in proximal right femur. There is  no hip dislocation. Electronically Signed   By: Lahoma Crocker M.D.   On: 04/01/2015 14:27   I have personally reviewed and evaluated these images and lab results as part of my medical decision-making.   EKG Interpretation None      MDM   Final diagnoses:  Closed fracture of right femur, unspecified fracture morphology, unspecified portion of femur, initial encounter [facility])    Patient is 81 year old female with history of metastatic renal cell carcinoma presenting with right femur fracture after falling this morning. Patient's right leg is in traction placed by EMS with visible deformity. Patient with 2+ popliteal pulses on that side, and good cap refill on her right foot. Sensation and motor function intact. Discussed case with orthopedics, will see patient and possibly take to OR tomorrow. Discussed case with hospitalist team. Will see and admit patient.    Vivi Barrack, MD 04/01/15 [place], MD 04/07/15 [phone]

## 2015-04-01 NOTE — Progress Notes (Signed)
Orthopedic Tech Progress Note Patient Details:  [name] March 21, 1933 [id number]  Ortho Devices Type of Ortho Device: Knee Immobilizer Ortho Device/Splint Interventions: Application   [place] Pink 04/01/2015, 5:26 PM

## 2015-04-01 NOTE — ED Notes (Signed)
Patient transported to Radiology 

## 2015-04-02 ENCOUNTER — Encounter (HOSPITAL_COMMUNITY)
Admission: EM | Disposition: A | Discharge status: Skilled Nursing Facility | Payer: Self-pay | Source: Home / Self Care | Attending: Internal Medicine

## 2015-04-02 ENCOUNTER — Inpatient Hospital Stay (HOSPITAL_COMMUNITY): Payer: PPO | Admitting: Certified Registered Nurse Anesthetist

## 2015-04-02 ENCOUNTER — Inpatient Hospital Stay (HOSPITAL_COMMUNITY): Payer: PPO

## 2015-04-02 DIAGNOSIS — C7931 Secondary malignant neoplasm of brain: Secondary | ICD-10-CM

## 2015-04-02 DIAGNOSIS — S7291XA Unspecified fracture of right femur, initial encounter for closed fracture: Secondary | ICD-10-CM

## 2015-04-02 DIAGNOSIS — C3491 Malignant neoplasm of unspecified part of right bronchus or lung: Secondary | ICD-10-CM

## 2015-04-02 DIAGNOSIS — G939 Disorder of brain, unspecified: Secondary | ICD-10-CM

## 2015-04-02 DIAGNOSIS — C649 Malignant neoplasm of unspecified kidney, except renal pelvis: Secondary | ICD-10-CM

## 2015-04-02 HISTORY — PX: HARDWARE REMOVAL: SHX979

## 2015-04-02 HISTORY — PX: FEMUR IM NAIL: SHX1597

## 2015-04-02 LAB — BASIC METABOLIC PANEL
ANION GAP: 6 (ref 5–15)
BUN: 21 mg/dL — AB (ref 6–20)
CHLORIDE: 104 mmol/L (ref 101–111)
CO2: 24 mmol/L (ref 22–32)
Calcium: 8.4 mg/dL — ABNORMAL LOW (ref 8.9–10.3)
Creatinine, Ser: 1.02 mg/dL — ABNORMAL HIGH (ref 0.44–1.00)
GFR calc Af Amer: 59 mL/min — ABNORMAL LOW (ref >60)
GFR calc non Af Amer: 51 mL/min — ABNORMAL LOW (ref >60)
GLUCOSE: 192 mg/dL — AB (ref 65–99)
POTASSIUM: 4.1 mmol/L (ref 3.5–5.1)
Sodium: 134 mmol/L — ABNORMAL LOW (ref 135–145)

## 2015-04-02 LAB — CBC
HEMATOCRIT: 28.7 % — AB (ref 36.0–46.0)
HEMOGLOBIN: 9.3 g/dL — AB (ref 12.0–15.0)
MCH: 28.6 pg (ref 26.0–34.0)
MCHC: 32.4 g/dL (ref 30.0–36.0)
MCV: 88.3 fL (ref 78.0–100.0)
Platelets: 260 10*3/uL (ref 150–400)
RBC: 3.25 MIL/uL — ABNORMAL LOW (ref 3.87–5.11)
RDW: 13.1 % (ref 11.5–15.5)
WBC: 6.4 10*3/uL (ref 4.0–10.5)

## 2015-04-02 LAB — SURGICAL PCR SCREEN
MRSA, PCR: NEGATIVE
Staphylococcus aureus: POSITIVE — AB

## 2015-04-02 LAB — PROTIME-INR
INR: 1.25 (ref 0.00–1.49)
Prothrombin Time: 15.9 seconds — ABNORMAL HIGH (ref 11.6–15.2)

## 2015-04-02 SURGERY — INSERTION, INTRAMEDULLARY ROD, FEMUR
Anesthesia: General | Laterality: Right

## 2015-04-02 MED ORDER — ONDANSETRON HCL 4 MG/2ML IJ SOLN
INTRAMUSCULAR | Status: DC | PRN
  Administered 2015-04-02: 4 mg via INTRAVENOUS

## 2015-04-02 MED ORDER — POLYETHYLENE GLYCOL 3350 17 G PO PACK: 17.0000 g | PACK | Freq: Every day | ORAL | Status: DC | PRN

## 2015-04-02 MED ORDER — ROCURONIUM BROMIDE 50 MG/5ML IV SOLN
INTRAVENOUS | Status: AC
  Filled 2015-04-02: qty 1

## 2015-04-02 MED ORDER — NEOSTIGMINE METHYLSULFATE 10 MG/10ML IV SOLN
INTRAVENOUS | Status: AC
  Filled 2015-04-02: qty 1

## 2015-04-02 MED ORDER — ONDANSETRON HCL 4 MG/2ML IJ SOLN: 4.0000 mg | Freq: Four times a day (QID) | INTRAMUSCULAR | Status: DC | PRN

## 2015-04-02 MED ORDER — PHENYLEPHRINE 40 MCG/ML (10ML) SYRINGE FOR IV PUSH (FOR BLOOD PRESSURE SUPPORT)
PREFILLED_SYRINGE | INTRAVENOUS | Status: AC
  Filled 2015-04-02: qty 20

## 2015-04-02 MED ORDER — ASPIRIN EC 325 MG PO TBEC
325.0000 mg | DELAYED_RELEASE_TABLET | Freq: Every day | ORAL | Status: DC
  Administered 2015-04-03 – 2015-04-04 (×2): 325 mg via ORAL
  Filled 2015-04-02 (×2): qty 1

## 2015-04-02 MED ORDER — PROPOFOL 10 MG/ML IV BOLUS
INTRAVENOUS | Status: DC | PRN
  Administered 2015-04-02 (×2): 40 mg via INTRAVENOUS

## 2015-04-02 MED ORDER — NEOSTIGMINE METHYLSULFATE 10 MG/10ML IV SOLN
INTRAVENOUS | Status: DC | PRN
  Administered 2015-04-02: 3 mg via INTRAVENOUS

## 2015-04-02 MED ORDER — HYDROCODONE-ACETAMINOPHEN 5-325 MG PO TABS: 1.0000 | ORAL_TABLET | ORAL | Status: DC | PRN

## 2015-04-02 MED ORDER — METOCLOPRAMIDE HCL 5 MG/ML IJ SOLN
5.0000 mg | Freq: Three times a day (TID) | INTRAMUSCULAR | Status: DC | PRN
  Administered 2015-04-02: 10 mg via INTRAVENOUS
  Filled 2015-04-02: qty 2

## 2015-04-02 MED ORDER — DEXAMETHASONE SODIUM PHOSPHATE 4 MG/ML IJ SOLN
INTRAMUSCULAR | Status: DC | PRN
  Administered 2015-04-02: 4 mg via INTRAVENOUS

## 2015-04-02 MED ORDER — FENTANYL CITRATE (PF) 100 MCG/2ML IJ SOLN
INTRAMUSCULAR | Status: AC
  Filled 2015-04-02: qty 2

## 2015-04-02 MED ORDER — ALUM & MAG HYDROXIDE-SIMETH 200-200-20 MG/5ML PO SUSP: 30.0000 mL | ORAL | Status: DC | PRN

## 2015-04-02 MED ORDER — LIDOCAINE HCL (CARDIAC) 20 MG/ML IV SOLN
INTRAVENOUS | Status: AC
  Filled 2015-04-02: qty 5

## 2015-04-02 MED ORDER — ONDANSETRON HCL 4 MG PO TABS: 4.0000 mg | ORAL_TABLET | Freq: Four times a day (QID) | ORAL | Status: DC | PRN

## 2015-04-02 MED ORDER — PHENYLEPHRINE HCL 10 MG/ML IJ SOLN
INTRAMUSCULAR | Status: DC | PRN
  Administered 2015-04-02 (×4): 120 ug via INTRAVENOUS

## 2015-04-02 MED ORDER — ACETAMINOPHEN 650 MG RE SUPP: 650.0000 mg | Freq: Four times a day (QID) | RECTAL | Status: DC | PRN

## 2015-04-02 MED ORDER — ONDANSETRON HCL 4 MG/2ML IJ SOLN: 4.0000 mg | Freq: Once | INTRAMUSCULAR | Status: DC | PRN

## 2015-04-02 MED ORDER — EPHEDRINE SULFATE 50 MG/ML IJ SOLN
INTRAMUSCULAR | Status: AC
  Filled 2015-04-02: qty 1

## 2015-04-02 MED ORDER — MENTHOL 3 MG MT LOZG: 1.0000 | LOZENGE | OROMUCOSAL | Status: DC | PRN

## 2015-04-02 MED ORDER — LACTATED RINGERS IV SOLN
INTRAVENOUS | Status: DC
  Administered 2015-04-02 (×2): via INTRAVENOUS

## 2015-04-02 MED ORDER — ACETAMINOPHEN 325 MG PO TABS: 650.0000 mg | ORAL_TABLET | Freq: Four times a day (QID) | ORAL | Status: DC | PRN

## 2015-04-02 MED ORDER — GLYCOPYRROLATE 0.2 MG/ML IJ SOLN
INTRAMUSCULAR | Status: DC | PRN
  Administered 2015-04-02: 0.4 mg via INTRAVENOUS

## 2015-04-02 MED ORDER — GLYCOPYRROLATE 0.2 MG/ML IJ SOLN
INTRAMUSCULAR | Status: AC
  Filled 2015-04-02: qty 2

## 2015-04-02 MED ORDER — FENTANYL CITRATE (PF) 100 MCG/2ML IJ SOLN
INTRAMUSCULAR | Status: DC | PRN
  Administered 2015-04-02 (×2): 50 ug via INTRAVENOUS

## 2015-04-02 MED ORDER — ONDANSETRON HCL 4 MG/2ML IJ SOLN
INTRAMUSCULAR | Status: AC
  Filled 2015-04-02: qty 2

## 2015-04-02 MED ORDER — METOCLOPRAMIDE HCL 5 MG PO TABS: 5.0000 mg | ORAL_TABLET | Freq: Three times a day (TID) | ORAL | Status: DC | PRN

## 2015-04-02 MED ORDER — CEFAZOLIN SODIUM-DEXTROSE 2-3 GM-% IV SOLR
2.0000 g | Freq: Once | INTRAVENOUS | Status: AC
  Administered 2015-04-02: 2 g via INTRAVENOUS

## 2015-04-02 MED ORDER — SODIUM CHLORIDE 0.9 % IV SOLN
INTRAVENOUS | Status: DC
Start: 2015-04-02 — End: 2015-04-03
  Administered 2015-04-02: 21:00:00 via INTRAVENOUS

## 2015-04-02 MED ORDER — CEFAZOLIN SODIUM-DEXTROSE 2-3 GM-% IV SOLR
INTRAVENOUS | Status: AC
  Filled 2015-04-02: qty 50

## 2015-04-02 MED ORDER — FENTANYL CITRATE (PF) 100 MCG/2ML IJ SOLN
25.0000 ug | INTRAMUSCULAR | Status: DC | PRN
  Administered 2015-04-02: 25 ug via INTRAVENOUS

## 2015-04-02 MED ORDER — DOCUSATE SODIUM 100 MG PO CAPS
100.0000 mg | ORAL_CAPSULE | Freq: Two times a day (BID) | ORAL | Status: DC
  Administered 2015-04-03 – 2015-04-04 (×3): 100 mg via ORAL
  Filled 2015-04-02 (×3): qty 1

## 2015-04-02 MED ORDER — FENTANYL CITRATE (PF) 250 MCG/5ML IJ SOLN
INTRAMUSCULAR | Status: AC
  Filled 2015-04-02: qty 5

## 2015-04-02 MED ORDER — HYDROCODONE-ACETAMINOPHEN 5-325 MG PO TABS: 1.0000 | ORAL_TABLET | Freq: Four times a day (QID) | ORAL | Status: DC | PRN

## 2015-04-02 MED ORDER — PHENOL 1.4 % MT LIQD: 1.0000 | OROMUCOSAL | Status: DC | PRN

## 2015-04-02 MED ORDER — LIDOCAINE HCL (CARDIAC) 20 MG/ML IV SOLN
INTRAVENOUS | Status: DC | PRN
  Administered 2015-04-02: 40 mg via INTRAVENOUS

## 2015-04-02 MED ORDER — CEFAZOLIN SODIUM 1-5 GM-% IV SOLN
1.0000 g | Freq: Four times a day (QID) | INTRAVENOUS | Status: AC
  Administered 2015-04-02 – 2015-04-03 (×2): 1 g via INTRAVENOUS
  Filled 2015-04-02 (×2): qty 50

## 2015-04-02 MED ORDER — 0.9 % SODIUM CHLORIDE (POUR BTL) OPTIME
TOPICAL | Status: DC | PRN
  Administered 2015-04-02: 300 mL

## 2015-04-02 MED ORDER — ROCURONIUM BROMIDE 100 MG/10ML IV SOLN
INTRAVENOUS | Status: DC | PRN
  Administered 2015-04-02: 30 mg via INTRAVENOUS

## 2015-04-02 MED ORDER — PHENYLEPHRINE HCL 10 MG/ML IJ SOLN
10.0000 mg | INTRAVENOUS | Status: DC | PRN
  Administered 2015-04-02: 25 ug/min via INTRAVENOUS

## 2015-04-02 MED ORDER — DEXAMETHASONE SODIUM PHOSPHATE 4 MG/ML IJ SOLN
INTRAMUSCULAR | Status: AC
  Filled 2015-04-02: qty 1

## 2015-04-02 SURGICAL SUPPLY — 69 items
BANDAGE ELASTIC 4 VELCRO ST LF (GAUZE/BANDAGES/DRESSINGS) IMPLANT
BANDAGE ELASTIC 6 VELCRO ST LF (GAUZE/BANDAGES/DRESSINGS) IMPLANT
BANDAGE ESMARK 6X9 LF (GAUZE/BANDAGES/DRESSINGS) IMPLANT
BIT DRILL 4.3MMS DISTAL GRDTED (BIT) IMPLANT
BNDG CMPR 9X6 STRL LF SNTH (GAUZE/BANDAGES/DRESSINGS)
BNDG COHESIVE 4X5 TAN STRL (GAUZE/BANDAGES/DRESSINGS) ×2 IMPLANT
BNDG ESMARK 6X9 LF (GAUZE/BANDAGES/DRESSINGS)
BNDG GAUZE ELAST 4 BULKY (GAUZE/BANDAGES/DRESSINGS) ×3 IMPLANT
CORTICAL BONE SCR 5.0MM X 46MM (Screw) ×3 IMPLANT
COVER PERINEAL POST (MISCELLANEOUS) ×3 IMPLANT
COVER SURGICAL LIGHT HANDLE (MISCELLANEOUS) ×3 IMPLANT
CUFF TOURNIQUET SINGLE 18IN (TOURNIQUET CUFF) ×1 IMPLANT
CUFF TOURNIQUET SINGLE 24IN (TOURNIQUET CUFF) IMPLANT
CUFF TOURNIQUET SINGLE 34IN LL (TOURNIQUET CUFF) IMPLANT
CUFF TOURNIQUET SINGLE 44IN (TOURNIQUET CUFF) IMPLANT
DRAPE C-ARM 42X72 X-RAY (DRAPES) IMPLANT
DRAPE EXTREMITY T 121X128X90 (DRAPE) IMPLANT
DRAPE INCISE IOBAN 66X45 STRL (DRAPES) IMPLANT
DRAPE ORTHO SPLIT 77X108 STRL (DRAPES)
DRAPE PROXIMA HALF (DRAPES) IMPLANT
DRAPE STERI IOBAN 125X83 (DRAPES) ×1 IMPLANT
DRAPE SURG 17X23 STRL (DRAPES) ×1 IMPLANT
DRAPE SURG ORHT 6 SPLT 77X108 (DRAPES) IMPLANT
DRILL 4.3MMS DISTAL GRADUATED (BIT) ×3
DRSG ADAPTIC 3X8 NADH LF (GAUZE/BANDAGES/DRESSINGS) ×3 IMPLANT
DRSG EMULSION OIL 3X3 NADH (GAUZE/BANDAGES/DRESSINGS) ×3 IMPLANT
DRSG MEPILEX BORDER 4X12 (GAUZE/BANDAGES/DRESSINGS) ×2 IMPLANT
DRSG MEPILEX BORDER 4X4 (GAUZE/BANDAGES/DRESSINGS) ×6 IMPLANT
DRSG MEPILEX BORDER 4X8 (GAUZE/BANDAGES/DRESSINGS) ×2 IMPLANT
DRSG PAD ABDOMINAL 8X10 ST (GAUZE/BANDAGES/DRESSINGS) ×1 IMPLANT
DURAPREP 26ML APPLICATOR (WOUND CARE) ×3 IMPLANT
ELECT REM PT RETURN 9FT ADLT (ELECTROSURGICAL) ×3
ELECTRODE REM PT RTRN 9FT ADLT (ELECTROSURGICAL) ×1 IMPLANT
EVACUATOR 1/8 PVC DRAIN (DRAIN) IMPLANT
GAUZE SPONGE 4X4 12PLY STRL (GAUZE/BANDAGES/DRESSINGS) ×3 IMPLANT
GLOVE BIOGEL PI IND STRL 7.5 (GLOVE) ×1 IMPLANT
GLOVE BIOGEL PI IND STRL 8 (GLOVE) ×1 IMPLANT
GLOVE BIOGEL PI INDICATOR 7.5 (GLOVE) ×2
GLOVE BIOGEL PI INDICATOR 8 (GLOVE) ×2
GLOVE ORTHO TXT STRL SZ7.5 (GLOVE) ×3 IMPLANT
GLOVE SURG ORTHO 8.0 STRL STRW (GLOVE) ×1 IMPLANT
GOWN STRL REUS W/ TWL LRG LVL3 (GOWN DISPOSABLE) ×3 IMPLANT
GOWN STRL REUS W/TWL LRG LVL3 (GOWN DISPOSABLE) ×9
GUIDEWIRE BALL NOSE 100CM (WIRE) ×2 IMPLANT
KIT BASIN OR (CUSTOM PROCEDURE TRAY) ×3 IMPLANT
KIT ROOM TURNOVER OR (KITS) ×3 IMPLANT
LINER BOOT UNIVERSAL DISP (MISCELLANEOUS) ×1 IMPLANT
MANIFOLD NEPTUNE II (INSTRUMENTS) ×1 IMPLANT
NAIL HIP FRA AFFIX 130D13X360R (Nail) ×2 IMPLANT
NS IRRIG 1000ML POUR BTL (IV SOLUTION) ×3 IMPLANT
PACK GENERAL/GYN (CUSTOM PROCEDURE TRAY) ×3 IMPLANT
PAD ARMBOARD 7.5X6 YLW CONV (MISCELLANEOUS) ×6 IMPLANT
PAD CAST 4YDX4 CTTN HI CHSV (CAST SUPPLIES) ×1 IMPLANT
PADDING CAST COTTON 4X4 STRL (CAST SUPPLIES) ×3
SCREW BONE CORTICAL 5.0X42 (Screw) ×2 IMPLANT
SCREW CORTICL BON 5.0MM X 46MM (Screw) IMPLANT
SCREW LAG 10.5MMX105MM HFN (Screw) ×2 IMPLANT
STAPLER VISISTAT 35W (STAPLE) ×1 IMPLANT
STOCKINETTE IMPERVIOUS 9X36 MD (GAUZE/BANDAGES/DRESSINGS) IMPLANT
SUT ETHILON 4 0 FS 1 (SUTURE) IMPLANT
SUT VIC AB 0 CT1 27 (SUTURE) ×6
SUT VIC AB 0 CT1 27XBRD ANBCTR (SUTURE) ×2 IMPLANT
SUT VIC AB 1 CT1 27 (SUTURE) ×3
SUT VIC AB 1 CT1 27XBRD ANBCTR (SUTURE) ×1 IMPLANT
SUT VIC AB 2-0 CT1 27 (SUTURE) ×3
SUT VIC AB 2-0 CT1 TAPERPNT 27 (SUTURE) ×1 IMPLANT
TOWEL OR 17X24 6PK STRL BLUE (TOWEL DISPOSABLE) ×3 IMPLANT
TOWEL OR 17X26 10 PK STRL BLUE (TOWEL DISPOSABLE) ×3 IMPLANT
WATER STERILE IRR 1000ML POUR (IV SOLUTION) ×1 IMPLANT

## 2015-04-02 NOTE — Consult Note (Signed)
Met with patient briefly in room.  [name] is alert and oriented to self and her surroundings.  She denies pain at this time.  Right leg is in traction.  She appears slightly confused with the events of the last 24 hours and is concerned about the time of surgery today.  Patient has been NPO per chart.  Her grand daughter [name] is at bedside and requests to speak outside the room.    Family is somewhat confused regarding the option of surgery.  According to [name], she and her father, [name], (HCPOA) were told that surgery was not an option yesterday in the ER.  She acknowledges that there was a discussion at bedside regarding the discovery of brain mets related to the long standing RCC.  [name] is not convinced that her grandmother understands the prognosis of the metastasis and that Dr. Shadad has recommended hospice.  [name] is very HOH and may not have understood the conversation fully.  Julie is not present at this time but per [name] he would like to speak to the physicians to fully understand the plan.  I relayed to [name] the role of palliative care in supporting and discussing goals of care moving forward with the possibility of hospice support at home vs SNF. I don't believe having a conversation with [name] today regarding choice for comfort care only with hospice support would be helpful and that it may very well add to any anxiety she is already feeling, waiting for the surgery to take place.   I have made arrangements to speak with [name] with her family, Julie and [name], at bedside tomorrow after the palliative femur pinning that is going to take place today.  Plan to follow tomorrow at Noon with family meeting.   Ellen , RN-BC, MSN, [facility]   

## 2015-04-02 NOTE — Progress Notes (Signed)
Tried to call Dr Alvan Dame still in [place] at [facility] told the family

## 2015-04-02 NOTE — Progress Notes (Signed)
Hearing aids given to son.

## 2015-04-02 NOTE — Progress Notes (Signed)
IP PROGRESS NOTE  Subjective:   Patient know to me with the following issues:  81 year old female with renal cell carcinoma initially diagnosed with stage IIIB in August 2001. She has developed a lung metastasis with stage IV disease in 2007. And recently found to have Disease with pulmonary, pancreatic and lung nodules.  Prior Therapy: 1. She had underwent a radical nephrectomy in August 2001. She had a stage IIIB disease. 2.The patient developed pulmonary metastases in July 2007 status post surgical resection. She has not had any recurrent disease since that time. 3. S/P Cryoablation of a left kidney mass done in 09/2010 Done by IR. 4. S/P recent FNA of a pancreatic lesion by Dr. Ardis Hughs. Pathology indicate metastatic renal cell cancer in 04/2011.   Current therapy: Observation and surveillance. Patient is not a great candidate for systemic therapy.  Patient was admitted on 04/01/2015 after she sustained a fall and had workup revealed a displaced bowel fracture of the mid shaft of the right femur. Imaging studies also did reveal intracranial metastasis undoubtedly related to renal cell carcinoma mets to the brain  Clinically, patient is confused and not oriented to time or place. Did not appear in any distress. She does not report any pain or discomfort.  Objective:  Vital signs in last 24 hours: Temp:  [96.4 F (35.8 C)-98 F (36.7 C)] 98 F (36.7 C) (11/28 0519) Pulse Rate:  [65-82] 77 (11/28 0519) Resp:  [14-18] 18 (11/28 0519) BP: (95-145)/(46-82) 105/46 mmHg (11/28 0519) SpO2:  [96 %-100 %] 98 % (11/28 0519) Weight change:  Last BM Date:  (prior to admossion)  Intake/Output from previous day: 11/27 0701 - 11/28 0700 In: -  Out: 450 [Urine:450] Alert, comfortable woman without distress. Mouth: mucous membranes moist, pharynx normal without lesions Resp: clear to auscultation bilaterally Cardio: regular rate and rhythm, S1, S2 normal,  no murmur, click, rub or gallop GI: soft, non-tender; bowel sounds normal; no masses,  no organomegaly Extremities: extremities normal, atraumatic, no cyanosis or edema    Lab Results:  Recent Labs  04/01/15 1935 04/02/15 0351  WBC 8.0 6.4  HGB 9.2* 9.3*  HCT 28.2* 28.7*  PLT 230 260    BMET  Recent Labs  04/01/15 1456 04/02/15 0351  NA 134* 134*  K 4.0 4.1  CL 105 104  CO2 22 24  GLUCOSE 134* 192*  BUN 19 21*  CREATININE 0.96 1.02*  CALCIUM 8.3* 8.4*    Studies/Results: Ct Head Wo Contrast  04/01/2015  CLINICAL DATA:  81 year old female with history of trauma from a fall this morning with laceration to the right temporal region. Head and neck pain. History of renal cell carcinoma status post nephrectomy with known metastatic disease. EXAM: CT HEAD WITHOUT CONTRAST CT CERVICAL SPINE WITHOUT CONTRAST TECHNIQUE: Multidetector CT imaging of the head and cervical spine was performed following the standard protocol without intravenous contrast. Multiplanar CT image reconstructions of the cervical spine were also generated. COMPARISON:  No priors. FINDINGS: CT HEAD FINDINGS There several high attenuation lesions throughout the brain, concerning for hemorrhagic metastases. These include a 6 mm lesion in the right frontal lobe (image 14 of series 2), an 8 mm lesion in the inferior left frontal lobe (image 11 of series 2), a 7 x 9 mm lesion in the left occipital lobe (image 17 of series 2), and a 5 x 7 mm lesion in the superior aspect of the right cerebellar hemisphere (image 11 of series 2). These are surrounded by areas of low  attenuation, presumably some associated vasogenic edema. Patchy and confluent areas of decreased attenuation are noted throughout the deep and periventricular white matter of the cerebral hemispheres bilaterally, compatible with chronic microvascular ischemic disease. Mild cerebral atrophy. No definite acute intra-axial or extra-axial hemorrhage. No hydrocephalus.  No acute displaced skull fractures. Visualized paranasal sinuses and mastoids are well pneumatized. CT CERVICAL SPINE FINDINGS No acute displaced fracture of the cervical spine. Alignment is anatomic. Prevertebral soft tissues are normal. Multilevel degenerative disc disease, most pronounced at C3-C4, C4-C5, C5-C6 and C6-C7. Mild multilevel facet arthropathy. Visualized portions of the upper thorax demonstrate some bilateral apical pleuroparenchymal thickening, most compatible with chronic post infectious or inflammatory scarring. IMPRESSION: 1. No signs of significant acute traumatic injury to the skull, brain or cervical spine. 2. However, there are 4 small high attenuation lesions in the brain, as detailed above, surrounded by extensive vasogenic edema, presumably multifocal metastatic lesions in this patient with known metastatic renal cell carcinoma. 3. Chronic microvascular ischemic changes and mild cerebral atrophy also noted. 4. Multilevel degenerative disc disease and cervical spondylosis, as above. Electronically Signed   By: Vinnie Langton M.D.   On: 04/01/2015 14:54   Ct Cervical Spine Wo Contrast  04/01/2015  CLINICAL DATA:  81 year old female with history of trauma from a fall this morning with laceration to the right temporal region. Head and neck pain. History of renal cell carcinoma status post nephrectomy with known metastatic disease. EXAM: CT HEAD WITHOUT CONTRAST CT CERVICAL SPINE WITHOUT CONTRAST TECHNIQUE: Multidetector CT imaging of the head and cervical spine was performed following the standard protocol without intravenous contrast. Multiplanar CT image reconstructions of the cervical spine were also generated. COMPARISON:  No priors. FINDINGS: CT HEAD FINDINGS There several high attenuation lesions throughout the brain, concerning for hemorrhagic metastases. These include a 6 mm lesion in the right frontal lobe (image 14 of series 2), an 8 mm lesion in the inferior left frontal lobe  (image 11 of series 2), a 7 x 9 mm lesion in the left occipital lobe (image 17 of series 2), and a 5 x 7 mm lesion in the superior aspect of the right cerebellar hemisphere (image 11 of series 2). These are surrounded by areas of low attenuation, presumably some associated vasogenic edema. Patchy and confluent areas of decreased attenuation are noted throughout the deep and periventricular white matter of the cerebral hemispheres bilaterally, compatible with chronic microvascular ischemic disease. Mild cerebral atrophy. No definite acute intra-axial or extra-axial hemorrhage. No hydrocephalus. No acute displaced skull fractures. Visualized paranasal sinuses and mastoids are well pneumatized. CT CERVICAL SPINE FINDINGS No acute displaced fracture of the cervical spine. Alignment is anatomic. Prevertebral soft tissues are normal. Multilevel degenerative disc disease, most pronounced at C3-C4, C4-C5, C5-C6 and C6-C7. Mild multilevel facet arthropathy. Visualized portions of the upper thorax demonstrate some bilateral apical pleuroparenchymal thickening, most compatible with chronic post infectious or inflammatory scarring. IMPRESSION: 1. No signs of significant acute traumatic injury to the skull, brain or cervical spine. 2. However, there are 4 small high attenuation lesions in the brain, as detailed above, surrounded by extensive vasogenic edema, presumably multifocal metastatic lesions in this patient with known metastatic renal cell carcinoma. 3. Chronic microvascular ischemic changes and mild cerebral atrophy also noted. 4. Multilevel degenerative disc disease and cervical spondylosis, as above. Electronically Signed   By: Vinnie Langton M.D.   On: 04/01/2015 14:54   Dg Chest Port 1 View  04/01/2015  CLINICAL DATA:  Postoperative radiograph. Current history of  metastatic lung cancer. Initial encounter. EXAM: PORTABLE CHEST 1 VIEW COMPARISON:  CT of the chest performed 07/19/2014 FINDINGS: Scattered nodules  are again noted within both lungs, the largest of which measures 2.0 cm at the right lung base. This has increased in size from 1.5 cm in March. The left costophrenic angle is incompletely imaged on this study. Pulmonary vascularity is at the upper limits of normal. Mild left basilar atelectasis is noted. No pneumothorax is seen. The cardiomediastinal silhouette is borderline enlarged. No acute osseous abnormalities are identified. IMPRESSION: 1. Scattered nodules within both lungs, measuring up to 2.0 cm at the right lung base, increased in size from March. This is compatible with the patient's known metastatic disease to the lungs. 2. Mild left basilar atelectasis noted. 3. Borderline cardiomegaly. Electronically Signed   By: Garald Balding M.D.   On: 04/01/2015 18:23   Dg Femur 1v Right  04/01/2015  CLINICAL DATA:  Fall this morning EXAM: RIGHT FEMUR 1 VIEW COMPARISON:  None. FINDINGS: Two views of the right femur submitted. There is displaced spiral fracture of mid shaft of the right femur below the level of intra medullary rod. Metallic fixation pin and intra medullary rod is noted in proximal right femur. There is no hip dislocation. Diffuse osteopenia. IMPRESSION: There is displaced spiral fracture of mid shaft of the right femur below the level of intra medullary rod. Metallic fixation pin and intra medullary rod is noted in proximal right femur. There is no hip dislocation. Electronically Signed   By: Lahoma Crocker M.D.   On: 04/01/2015 14:27    Medications: I have reviewed the patient's current medications.  Assessment/Plan:  81 year old woman with the following issues:  1. Stage IV renal cell carcinoma with metastatic disease to the lung, pancreas and brain. She is not a candidate for any systemic therapy given her frail status and decline in her performance status and quality of life.   I have recommended hospice involvement and possible residential hospice placement upon discharge. Her  prognosis is rather poor given her recent finding of brain metastasis.  2. Brain metastasis: Given her vasogenic edema, short course of steroids and a quick taper would be reasonable for palliative purposes. Radiation therapy will add very little at this point from a comfort measure standpoint.  3. Right femur fracture: Any surgery attempted should be for palliative purposes at this time. She appears to be pain-free at this time.  4. Prognosis: Very poor with limited life expectancy. She would be a hospice candidate   LOS: 1 day   [facility] 04/02/2015, 9:40 AM

## 2015-04-02 NOTE — Progress Notes (Addendum)
Patient Demographics:    [name], is a 81 y.o. female, DOB - 1932-09-25, QQP:[id number]  Admit date - 04/01/2015   Admitting Physician Julie Shiley, MD  Outpatient Primary MD for the patient is Julie Broker, MD  LOS - 1   Chief Complaint  Patient presents with  . Leg Injury  . Fall        Subjective:    [name] today has, No headache, No chest pain, No abdominal pain - No Nausea, No new weakness tingling or numbness, No Cough - SOB.     Assessment  & Plan :     1. Mechanical fall with right femoral fracture. Orthopedics to evaluate, surgery will be only for pain control and palliative purposes. For now continue traction, continue pain control. Thereafter per orthopedics. Currently SCDs for DVT prophylaxis.  2. Stage IV RCC with now evidence of brain metastases. Discussed with her oncologist [name] bedside, prognosis extremely poor, for now IV Decadron short-term for vasogenic edema, he recommends hospice, discussed with sister, she agrees with it. Palliative care consulted.  3. Anemia of chronic disease. Stable, no need for transfusion as of now.   4. Early dementia. At risk for delirium, minimize narcotics and benzodiazepines.    Code Status : No CPR or intubation  Family Communication  : Sister, granddaughter and son x 2.  Disposition Plan  : TBD  Consults  :  Ortho, Pall care, Onco  Procedures  :    DVT Prophylaxis  :   SCDs     Lab Results  Component Value Date   PLT 260 04/02/2015    Inpatient Medications  Scheduled Meds: . buPROPion  150 mg Oral Daily  . dexamethasone  8 mg Oral 3 times per day  . docusate sodium  100 mg Oral BID  . DULoxetine  30 mg Oral Daily  .  HYDROmorphone (DILAUDID) injection  0.5 mg Intravenous Once  .  megestrol  400 mg Oral BID  . pantoprazole (PROTONIX) IV  40 mg Intravenous Q24H   Continuous Infusions: . sodium chloride     PRN Meds:.acetaminophen **OR** acetaminophen, HYDROcodone-acetaminophen, HYDROmorphone (DILAUDID) injection, methocarbamol (ROBAXIN)  IV, ondansetron **OR** ondansetron (ZOFRAN) IV, traZODone  Antibiotics  :     Anti-infectives    None        Objective:   Filed Vitals:   04/01/15 1800 04/01/15 1850 04/01/15 1925 04/02/15 0519  BP: 95/73 129/55 101/67 105/46  Pulse: 71 74 73 77  Temp:  97.7 F (36.5 C) 98 F (36.7 C) 98 F (36.7 C)  TempSrc:   Oral Oral  Resp: '18 18 18 18  '$ SpO2: 97% 96% 96% 98%    Wt Readings from Last 3 Encounters:  01/04/15 44.316 kg (97 lb 11.2 oz)  11/24/14 43.908 kg (96 lb 12.8 oz)  07/26/14 44.951 kg (99 lb 1.6 oz)     Intake/Output Summary (Last 24 hours) at 04/02/15 1048 Last data filed at 04/02/15 0519  Gross per 24 hour  Intake      0 ml  Output    450 ml  Net   -450 ml     Physical Exam  Awake, hard of hearing and pleasantly confused, No new F.N deficits, Normal affect  [place].AT,PERRAL Supple Neck,No JVD, No cervical lymphadenopathy appriciated.  Symmetrical Chest wall movement, Good air movement bilaterally, CTAB RRR,No Gallops,Rubs or new Murmurs, No Parasternal Heave +ve B.Sounds, Abd Soft, No tenderness, No organomegaly appriciated, No rebound - guarding or rigidity. No Cyanosis, Clubbing or edema, No new Rash or bruise , right leg in traction    Data Review:   Micro Results Recent Results (from the past 240 hour(s))  Surgical pcr screen     Status: Abnormal   Collection Time: 04/02/15  6:19 AM  Result Value Ref Range Status   MRSA, PCR NEGATIVE NEGATIVE Final   Staphylococcus aureus POSITIVE (A) NEGATIVE Final    Comment:        The Xpert SA Assay (FDA approved for NASAL specimens in patients over 1 years of age), is one component of a comprehensive surveillance program.  Test performance  has been validated by [facility] for patients greater than or equal to 34 year old. It is not intended to diagnose infection nor to guide or monitor treatment.     Radiology Reports Ct Head Wo Contrast  04/01/2015  CLINICAL DATA:  81 year old female with history of trauma from a fall this morning with laceration to the right temporal region. Head and neck pain. History of renal cell carcinoma status post nephrectomy with known metastatic disease. EXAM: CT HEAD WITHOUT CONTRAST CT CERVICAL SPINE WITHOUT CONTRAST TECHNIQUE: Multidetector CT imaging of the head and cervical spine was performed following the standard protocol without intravenous contrast. Multiplanar CT image reconstructions of the cervical spine were also generated. COMPARISON:  No priors. FINDINGS: CT HEAD FINDINGS There several high attenuation lesions throughout the brain, concerning for hemorrhagic metastases. These include a 6 mm lesion in the right frontal lobe (image 14 of series 2), an 8 mm lesion in the inferior left frontal lobe (image 11 of series 2), a 7 x 9 mm lesion in the left occipital lobe (image 17 of series 2), and a 5 x 7 mm lesion in the superior aspect of the right cerebellar hemisphere (image 11 of series 2). These are surrounded by areas of low attenuation, presumably some associated vasogenic edema. Patchy and confluent areas of decreased attenuation are noted throughout the deep and periventricular white matter of the cerebral hemispheres bilaterally, compatible with chronic microvascular ischemic disease. Mild cerebral atrophy. No definite acute intra-axial or extra-axial hemorrhage. No hydrocephalus. No acute displaced skull fractures. Visualized paranasal sinuses and mastoids are well pneumatized. CT CERVICAL SPINE FINDINGS No acute displaced fracture of the cervical spine. Alignment is anatomic. Prevertebral soft tissues are normal. Multilevel degenerative disc disease, most pronounced at C3-C4, C4-C5, C5-C6  and C6-C7. Mild multilevel facet arthropathy. Visualized portions of the upper thorax demonstrate some bilateral apical pleuroparenchymal thickening, most compatible with chronic post infectious or inflammatory scarring. IMPRESSION: 1. No signs of significant acute traumatic injury to the skull, brain or cervical spine. 2. However, there are 4 small high attenuation lesions in the brain, as detailed above, surrounded by extensive vasogenic edema, presumably multifocal metastatic lesions in this patient with known metastatic renal cell carcinoma. 3. Chronic microvascular ischemic changes and mild cerebral atrophy also noted. 4. Multilevel degenerative disc disease and cervical spondylosis, as above. Electronically Signed   By: Vinnie Langton M.D.   On: 04/01/2015 14:54   Ct Cervical Spine Wo Contrast  04/01/2015  CLINICAL DATA:  81 year old female with history of trauma from a fall this morning with laceration to the right temporal region. Head and neck pain. History  of renal cell carcinoma status post nephrectomy with known metastatic disease. EXAM: CT HEAD WITHOUT CONTRAST CT CERVICAL SPINE WITHOUT CONTRAST TECHNIQUE: Multidetector CT imaging of the head and cervical spine was performed following the standard protocol without intravenous contrast. Multiplanar CT image reconstructions of the cervical spine were also generated. COMPARISON:  No priors. FINDINGS: CT HEAD FINDINGS There several high attenuation lesions throughout the brain, concerning for hemorrhagic metastases. These include a 6 mm lesion in the right frontal lobe (image 14 of series 2), an 8 mm lesion in the inferior left frontal lobe (image 11 of series 2), a 7 x 9 mm lesion in the left occipital lobe (image 17 of series 2), and a 5 x 7 mm lesion in the superior aspect of the right cerebellar hemisphere (image 11 of series 2). These are surrounded by areas of low attenuation, presumably some associated vasogenic edema. Patchy and confluent  areas of decreased attenuation are noted throughout the deep and periventricular white matter of the cerebral hemispheres bilaterally, compatible with chronic microvascular ischemic disease. Mild cerebral atrophy. No definite acute intra-axial or extra-axial hemorrhage. No hydrocephalus. No acute displaced skull fractures. Visualized paranasal sinuses and mastoids are well pneumatized. CT CERVICAL SPINE FINDINGS No acute displaced fracture of the cervical spine. Alignment is anatomic. Prevertebral soft tissues are normal. Multilevel degenerative disc disease, most pronounced at C3-C4, C4-C5, C5-C6 and C6-C7. Mild multilevel facet arthropathy. Visualized portions of the upper thorax demonstrate some bilateral apical pleuroparenchymal thickening, most compatible with chronic post infectious or inflammatory scarring. IMPRESSION: 1. No signs of significant acute traumatic injury to the skull, brain or cervical spine. 2. However, there are 4 small high attenuation lesions in the brain, as detailed above, surrounded by extensive vasogenic edema, presumably multifocal metastatic lesions in this patient with known metastatic renal cell carcinoma. 3. Chronic microvascular ischemic changes and mild cerebral atrophy also noted. 4. Multilevel degenerative disc disease and cervical spondylosis, as above. Electronically Signed   By: Vinnie Langton M.D.   On: 04/01/2015 14:54   Dg Chest Port 1 View  04/01/2015  CLINICAL DATA:  Postoperative radiograph. Current history of metastatic lung cancer. Initial encounter. EXAM: PORTABLE CHEST 1 VIEW COMPARISON:  CT of the chest performed 07/19/2014 FINDINGS: Scattered nodules are again noted within both lungs, the largest of which measures 2.0 cm at the right lung base. This has increased in size from 1.5 cm in March. The left costophrenic angle is incompletely imaged on this study. Pulmonary vascularity is at the upper limits of normal. Mild left basilar atelectasis is noted. No  pneumothorax is seen. The cardiomediastinal silhouette is borderline enlarged. No acute osseous abnormalities are identified. IMPRESSION: 1. Scattered nodules within both lungs, measuring up to 2.0 cm at the right lung base, increased in size from March. This is compatible with the patient's known metastatic disease to the lungs. 2. Mild left basilar atelectasis noted. 3. Borderline cardiomegaly. Electronically Signed   By: Garald Balding M.D.   On: 04/01/2015 18:23   Dg Femur 1v Right  04/01/2015  CLINICAL DATA:  Fall this morning EXAM: RIGHT FEMUR 1 VIEW COMPARISON:  None. FINDINGS: Two views of the right femur submitted. There is displaced spiral fracture of mid shaft of the right femur below the level of intra medullary rod. Metallic fixation pin and intra medullary rod is noted in proximal right femur. There is no hip dislocation. Diffuse osteopenia. IMPRESSION: There is displaced spiral fracture of mid shaft of the right femur below the level of intra  medullary rod. Metallic fixation pin and intra medullary rod is noted in proximal right femur. There is no hip dislocation. Electronically Signed   By: Lahoma Crocker M.D.   On: 04/01/2015 14:27     CBC  Recent Labs Lab 04/01/15 1456 04/01/15 1935 04/02/15 0351  WBC 8.2 8.0 6.4  HGB 8.9* 9.2* 9.3*  HCT 27.8* 28.2* 28.7*  PLT 224 230 260  MCV 89.4 88.7 88.3  MCH 28.6 28.9 28.6  MCHC 32.0 32.6 32.4  RDW 13.0 13.0 13.1  LYMPHSABS 0.3*  --   --   MONOABS 0.3  --   --   EOSABS 0.0  --   --   BASOSABS 0.0  --   --     Chemistries   Recent Labs Lab 04/01/15 1456 04/02/15 0351  NA 134* 134*  K 4.0 4.1  CL 105 104  CO2 22 24  GLUCOSE 134* 192*  BUN 19 21*  CREATININE 0.96 1.02*  CALCIUM 8.3* 8.4*   ------------------------------------------------------------------------------------------------------------------ CrCl cannot be calculated (Unknown ideal  weight.). ------------------------------------------------------------------------------------------------------------------ No results for input(s): HGBA1C in the last 72 hours. ------------------------------------------------------------------------------------------------------------------ No results for input(s): CHOL, HDL, LDLCALC, TRIG, CHOLHDL, LDLDIRECT in the last 72 hours. ------------------------------------------------------------------------------------------------------------------ No results for input(s): TSH, T4TOTAL, T3FREE, THYROIDAB in the last 72 hours.  Invalid input(s): FREET3 ------------------------------------------------------------------------------------------------------------------ No results for input(s): VITAMINB12, FOLATE, FERRITIN, TIBC, IRON, RETICCTPCT in the last 72 hours.  Coagulation profile  Recent Labs Lab 04/01/15 1456 04/02/15 0351  INR 1.33 1.25    No results for input(s): DDIMER in the last 72 hours.  Cardiac Enzymes No results for input(s): CKMB, TROPONINI, MYOGLOBIN in the last 168 hours.  Invalid input(s): CK ------------------------------------------------------------------------------------------------------------------ Invalid input(s): POCBNP   Time Spent in minutes  35   Cristhian Vanhook K M.D on 04/02/2015 at 10:48 AM  Between 7am to 7pm - Pager - [phone]  After 7pm go to [email or web address] - password [email or web address]  [facility] -  Office  [phone]

## 2015-04-02 NOTE — Progress Notes (Signed)
Paged Dr Candiss Norse son spoke with him on the phone,Family wants to see Dr face to face paged Dr Alvan Dame will speak with family after 1500.

## 2015-04-02 NOTE — Transfer of Care (Signed)
Immediate Anesthesia Transfer of Care Note  Patient: [name]  Procedure(s) Performed: Procedure(s): INTRAMEDULLARY (IM) NAIL FEMORAL (Right) HARDWARE REMOVAL (Right)  Patient Location: PACU  Anesthesia Type:General  Level of Consciousness: sedated, patient cooperative and responds to stimulation  Airway & Oxygen Therapy: Patient Spontanous Breathing and Patient connected to nasal cannula oxygen  Post-op Assessment: Report given to RN, Post -op Vital signs reviewed and stable and Patient moving all extremities X 4  Post vital signs: Reviewed and stable  Last Vitals:  Filed Vitals:   04/02/15 1646 04/02/15 1949  BP: 121/46   Pulse: 79   Temp: 36.8 C 36.4 C  Resp: 16     Complications: No apparent anesthesia complications

## 2015-04-02 NOTE — Brief Op Note (Signed)
04/01/2015 - 04/02/2015  8:04 PM  PATIENT:  [name]  81 y.o. female  PRE-OPERATIVE DIAGNOSIS:  right peri-prosthetic femur fracture  POST-OPERATIVE DIAGNOSIS:  right peri-prosthetic femur fracture  PROCEDURE:  Procedure(s): INTRAMEDULLARY (IM) NAIL FEMORAL (Right) HARDWARE REMOVAL (Right)  SURGEON:  Surgeon(s) and Role:    * Paralee Cancel, MD - Primary  PHYSICIAN ASSISTANT: None  ANESTHESIA:   general  EBL:  Total I/O In: 1000 [I.V.:1000] Out: 250 [Urine:200; Blood:50]  BLOOD ADMINISTERED:none  DRAINS: none   LOCAL MEDICATIONS USED:  NONE  SPECIMEN:  No Specimen  DISPOSITION OF SPECIMEN:  N/A  COUNTS:  YES  TOURNIQUET:  * No tourniquets in log *  DICTATION: .Other Dictation: Dictation Number [id number]  PLAN OF CARE: Admit to inpatient   PATIENT DISPOSITION:  PACU - hemodynamically stable.   Delay start of Pharmacological VTE agent (>24hrs) due to surgical blood loss or risk of bleeding: no

## 2015-04-02 NOTE — Anesthesia Postprocedure Evaluation (Addendum)
Anesthesia Post Note  Patient: [name]  Procedure(s) Performed: Procedure(s) (LRB): INTRAMEDULLARY (IM) NAIL FEMORAL (Right) HARDWARE REMOVAL (Right)  Patient location during evaluation: PACU Anesthesia Type: General Level of consciousness: awake and alert Pain management: pain level controlled Vital Signs Assessment: post-procedure vital signs reviewed and stable Respiratory status: spontaneous breathing, nonlabored ventilation, respiratory function stable and patient connected to nasal cannula oxygen Cardiovascular status: blood pressure returned to baseline and stable Postop Assessment: no signs of nausea or vomiting Anesthetic complications: no    Last Vitals:  Filed Vitals:   04/02/15 1646 04/02/15 1949  BP: 121/46   Pulse: 79 85  Temp: 36.8 C 36.4 C  Resp: 16 17    Last Pain:  Filed Vitals:   04/02/15 2003  PainSc: Asleep        RLE Motor Response: Purposeful movement, Responds to commands RLE Sensation: Full sensation      Zenaida Deed

## 2015-04-02 NOTE — Progress Notes (Signed)
To OR per bed Dr Alvan Dame had called and spoke with grandaughter.Son signed consent

## 2015-04-02 NOTE — Progress Notes (Signed)
Utilization review completed.  

## 2015-04-02 NOTE — Anesthesia Preprocedure Evaluation (Addendum)
Anesthesia Evaluation  Patient identified by MRN, date of birth, ID band Patient awake    Reviewed: Allergy & Precautions, NPO status , Patient's Chart, lab work & pertinent test results  Airway Mallampati: II  TM Distance: <3 FB Neck ROM: Full    Dental  (+) Teeth Intact, Dental Advisory Given   Pulmonary shortness of breath,  Lung ca with mets   Pulmonary exam normal breath sounds clear to auscultation       Cardiovascular (-) hypertension(-) angina(-) CAD and (-) Past MI negative cardio ROS Normal cardiovascular exam Rhythm:Regular Rate:Normal     Neuro/Psych PSYCHIATRIC DISORDERS Depression Demential; evidence of brain mets from [place] on decadron.    GI/Hepatic negative GI ROS, Neg liver ROS,   Endo/Other  negative endocrine ROS  Renal/GU Stage IV RCC with now evidence of brain metastases      Musculoskeletal Right femur fracture    Abdominal   Peds  Hematology  (+) Blood dyscrasia, anemia ,   Anesthesia Other Findings Day of surgery medications reviewed with the patient.  Reproductive/Obstetrics                          Anesthesia Physical Anesthesia Plan  ASA: III  Anesthesia Plan: General   Post-op Pain Management:    Induction: Intravenous  Airway Management Planned: Oral ETT  Additional Equipment:   Intra-op Plan:   Post-operative Plan: Possible Post-op intubation/ventilation  Informed Consent: I have reviewed the patients History and Physical, chart, labs and discussed the procedure including the risks, benefits and alternatives for the proposed anesthesia with the patient or authorized representative who has indicated his/her understanding and acceptance.   Dental advisory given  Plan Discussed with: CRNA  Anesthesia Plan Comments: (Risks/benefits of general anesthesia discussed with patient including risk of damage to teeth, lips, gum, and tongue, nausea/vomiting,  allergic reactions to medications, and the possibility of heart attack, stroke and death.  All patient questions answered.  Patient wishes to proceed.)       Anesthesia Quick Evaluation

## 2015-04-02 NOTE — Progress Notes (Signed)
Patient ID: [name], female   DOB: 1933-02-07, 81 y.o.   MRN: [id number]   Asked to assume surgical care of Ms Crenshaw's right femur fracture No other injuries to report  Right LE in Buck's traction  Plan to go to OR today for removal of old IM nail and replacement with new long nail  Dx:  Right peri-prosthetic femur fracture  Consent ordered

## 2015-04-03 ENCOUNTER — Encounter (HOSPITAL_COMMUNITY): Payer: Self-pay | Admitting: Orthopedic Surgery

## 2015-04-03 DIAGNOSIS — S72001G Fracture of unspecified part of neck of right femur, subsequent encounter for closed fracture with delayed healing: Secondary | ICD-10-CM

## 2015-04-03 DIAGNOSIS — C641 Malignant neoplasm of right kidney, except renal pelvis: Secondary | ICD-10-CM

## 2015-04-03 LAB — CBC
HCT: 25.5 % — ABNORMAL LOW (ref 36.0–46.0)
HEMATOCRIT: 19.5 % — AB (ref 36.0–46.0)
HEMOGLOBIN: 6.2 g/dL — AB (ref 12.0–15.0)
HEMOGLOBIN: 8.5 g/dL — AB (ref 12.0–15.0)
MCH: 28.1 pg (ref 26.0–34.0)
MCH: 28.8 pg (ref 26.0–34.0)
MCHC: 31.8 g/dL (ref 30.0–36.0)
MCHC: 33.3 g/dL (ref 30.0–36.0)
MCV: 88.2 fL (ref 78.0–100.0)
MCV: 86.4 fL (ref 78.0–100.0)
Platelets: 194 10*3/uL (ref 150–400)
Platelets: 188 10*3/uL (ref 150–400)
RBC: 2.21 MIL/uL — ABNORMAL LOW (ref 3.87–5.11)
RBC: 2.95 MIL/uL — AB (ref 3.87–5.11)
RDW: 13.4 % (ref 11.5–15.5)
RDW: 13.5 % (ref 11.5–15.5)
WBC: 10.4 10*3/uL (ref 4.0–10.5)
WBC: 10.8 10*3/uL — ABNORMAL HIGH (ref 4.0–10.5)

## 2015-04-03 LAB — BASIC METABOLIC PANEL
Anion gap: 7 (ref 5–15)
BUN: 24 mg/dL — AB (ref 6–20)
CHLORIDE: 110 mmol/L (ref 101–111)
CO2: 20 mmol/L — AB (ref 22–32)
CREATININE: 1.08 mg/dL — AB (ref 0.44–1.00)
Calcium: 7.8 mg/dL — ABNORMAL LOW (ref 8.9–10.3)
GFR calc Af Amer: 55 mL/min — ABNORMAL LOW (ref >60)
GFR calc non Af Amer: 47 mL/min — ABNORMAL LOW (ref >60)
Glucose, Bld: 127 mg/dL — ABNORMAL HIGH (ref 65–99)
Potassium: 4.4 mmol/L (ref 3.5–5.1)
Sodium: 137 mmol/L (ref 135–145)

## 2015-04-03 LAB — PREPARE RBC (CROSSMATCH)

## 2015-04-03 MED ORDER — SODIUM CHLORIDE 0.9 % IV BOLUS (SEPSIS)
500.0000 mL | Freq: Once | INTRAVENOUS | Status: AC
  Administered 2015-04-03: 500 mL via INTRAVENOUS

## 2015-04-03 MED ORDER — DIPHENHYDRAMINE HCL 50 MG/ML IJ SOLN: 25.0000 mg | Freq: Four times a day (QID) | INTRAMUSCULAR | Status: DC | PRN

## 2015-04-03 MED ORDER — ASPIRIN EC 325 MG PO TBEC: 325.0000 mg | DELAYED_RELEASE_TABLET | Freq: Every day | ORAL | Status: DC

## 2015-04-03 MED ORDER — SODIUM CHLORIDE 0.9 % IV SOLN
Freq: Once | INTRAVENOUS | Status: AC
  Administered 2015-04-03: 07:00:00 via INTRAVENOUS

## 2015-04-03 MED ORDER — SODIUM CHLORIDE 0.9 % IV SOLN: Freq: Once | INTRAVENOUS | Status: AC

## 2015-04-03 MED ORDER — DEXAMETHASONE 4 MG PO TABS
4.0000 mg | ORAL_TABLET | Freq: Two times a day (BID) | ORAL | Status: DC
Start: 2015-04-03 — End: 2015-04-04
  Administered 2015-04-03 – 2015-04-04 (×2): 4 mg via ORAL
  Filled 2015-04-03 (×2): qty 1

## 2015-04-03 MED ORDER — HYDROCODONE-ACETAMINOPHEN 5-325 MG PO TABS
1.0000 | ORAL_TABLET | Freq: Four times a day (QID) | ORAL | Status: DC | PRN
  Filled 2015-04-03: qty 1

## 2015-04-03 MED ORDER — SODIUM CHLORIDE 0.9 % IV SOLN: Freq: Once | INTRAVENOUS | Status: DC

## 2015-04-03 MED ORDER — FUROSEMIDE 10 MG/ML IJ SOLN
20.0000 mg | Freq: Once | INTRAMUSCULAR | Status: AC
  Administered 2015-04-03: 20 mg via INTRAVENOUS
  Filled 2015-04-03: qty 2

## 2015-04-03 MED ORDER — FUROSEMIDE 10 MG/ML IJ SOLN: 20.0000 mg | Freq: Once | INTRAMUSCULAR | Status: DC

## 2015-04-03 MED ORDER — HYDROCODONE-ACETAMINOPHEN 5-325 MG PO TABS: 1.0000 | ORAL_TABLET | Freq: Four times a day (QID) | ORAL | Status: DC | PRN

## 2015-04-03 NOTE — Progress Notes (Addendum)
Patient ID: [name], female   DOB: [birth year], 81 y.o.   MRN: [id number] Subjective: 1 Day Post-Op Procedure(s) (LRB): INTRAMEDULLARY (IM) NAIL FEMORAL (Right) HARDWARE REMOVAL (Right)    Patient resting comfortably this am.  Still confused - baseline  Objective:   VITALS:   Filed Vitals:   04/03/15 0504 04/03/15 0505  BP: 85/42 90/50  Pulse: 83   Temp:    Resp:      Neurovascular intact Incision: dressing C/D/I, right thigh - knee immobilizer on for comfort purposes  LABS  Recent Labs  04/01/15 1456 04/01/15 1935 04/02/15 0351  HGB 8.9* 9.2* 9.3*  HCT 27.8* 28.2* 28.7*  WBC 8.2 8.0 6.4  PLT 224 230 260     Recent Labs  04/01/15 1456 04/02/15 0351 04/03/15 0359  NA 134* 134* 137  K 4.0 4.1 4.4  BUN 19 21* 24*  CREATININE 0.96 1.02* 1.08*  GLUCOSE 134* 192* 127*     Recent Labs  04/01/15 1456 04/02/15 0351  INR 1.33 1.25     Assessment/Plan: 1 Day Post-Op Procedure(s) (LRB): INTRAMEDULLARY (IM) NAIL FEMORAL (Right) HARDWARE REMOVAL (Right)   Advance diet Up with therapy - knee immobilizer with activity to reduce stress at fracture site  PWB RLE for transfers and mobilization  Post op Hgb 6.2 -  ABLA - transfuse 2 units PRBCs this   Dispo per medicine

## 2015-04-03 NOTE — Progress Notes (Signed)
Transfusion order clarified with Dr. Candiss Norse and per MD, he want pt to received only 1 unit of blood. Gave verbal order to cancel remaining ordered units. P. Amo Tiah Heckel. RN.

## 2015-04-03 NOTE — Op Note (Signed)
[name], [name]           ACCOUNT NO.:  [id number]  MEDICAL RECORD NO.:  [id number]  LOCATION:  [id number]                        FACILITY:  [facility]  PHYSICIAN:  Pietro Cassis. Alvan Dame, M.D.  DATE OF BIRTH:  08-24-1932  DATE OF PROCEDURE:  04/02/2015 DATE OF DISCHARGE:                              OPERATIVE REPORT   PREOPERATIVE DIAGNOSES:  Right periprosthetic femur fracture below and previously-placed short trochanteric nail for intertrochanteric femur fracture.  POSTOPERATIVE DIAGNOSES:  Right periprosthetic femur fracture below and previously-placed short trochanteric nail for intertrochanteric femur fracture.  PROCEDURES: 1. Removal of deep implant. 2. Open reduction and internal fixation of right midshaft spiral femur     fracture utilizing a 360 x 13-mm trochanteric nail with a single     screw into the femoral head and neck, locked, and then two distal     interlocks.  SURGEON:  Pietro Cassis. Alvan Dame, M.D.  ASSISTANT:  Surgical team.  ANESTHESIA:  General.  SPECIMENS:  None.  COMPLICATION:  None.  BLOOD LOSS:  Probably 300 mL.  INDICATION FOR PROCEDURE:  [name] is an 81 year old female with a diagnosis of renal cell carcinoma.  She had been living alone, there has been some recent concerns about brain metastasis and some memory issues.  She unfortunately had a fall, trying to get out of bed on the 27th.  She was brought to the emergency room by EMS after her son found her.  Radiographs revealed a fracture distal to her previously-placed troch nail.  I discussed with her son who is her power-of-attorney as well as her granddaughter the indications for surgery in this case to see whether or not it is palliative or not, it helps with immobilization of the fracture and allow [place] for potential healing and pain control.  No matter further treatment and recommendations for her primary tumor, this would at least provide some comfort and support for.  Risks of nonunion,  need for future surgery were discussed with the benefit of stabilization proved to be beneficial.  PROCEDURE IN DETAIL:  The patient was brought to the operative theater. Once adequate anesthesia, preoperative antibiotics, Ancef administered, the patient was positioned supine on the fracture table.  Her unaffected left lower extremity was flexed and abducted out of the way with bony prominences padded, particularly the peroneal nerve.  Padded perineal post was placed in her groin.  Her right foot was placed in a traction boot.  Gentle traction was initially applied to apply tension across the fracture site and she was further positioned and prepared for surgery.  Once she was positioned adequately, fluoroscopy was brought to the field and under fluoroscopy, I applied traction across the fracture site.  We identified landmarks.  The right lower extremity was then prepped and draped from the iliac crest to below the knee.  Time-out was performed identifying the patient, planned procedure and extremity.  Attention was first directed towards the removal of the implant and this was done utilizing the implants from the De[employer]uy ACE set as the nail was recognized to be from that period of time.  Basically, standard incisions were utilized over the previously-placed nail and I had to use the osteotomes to remove some bone over  the proximal nail as well as the lag screw.  The nail was removed in that order that I had placed a guidewire into the proximal aspect of the femur and overdrilled this to allow for exposure of the proximal femur and then used the osteotomes to remove the remaining bone.  I then placed the insertion screwdriver onto the lag screw after removing the bone laterally.  I was able to remove the lag screw without difficulty.  Once this was done, the extraction rod was placed on the proximal nail and tightened down.  This was little bit more challenging than it appears, but  nonetheless it was carried out successfully.  Once I had this placed, I removed the distal interlocking screw.  The nail was then removed without complication.  At this point, attention was now directed to open reduction and internal fixation of the femur fracture utilizing the reduction tool and passes across the fracture site and then the ball-tipped guidewire was passed across this and confirmed on AP and lateral planes of the distal knee. I measured and selected a 360-mm nail.  I then reamed up and began with 11-mm reamer and reamed up to 14.5-mm and selected the 13-mm diameter nail.  This nail was then passed by hand to its appropriate depth.  The proximal lag screw was placed basically in the same lag screw position and then I tightened the locking bolt down to make this a fixed angle device.  At this point, utilizing fluoroscopy given the displaced and spiral nature of the fracture, I did make a separate incision and used an angled clamp to identify the reduction that I liked the best.  With this reduction held, two distal interlocks were placed through perfect circle technique distally.  Once this was done, final radiographs were obtained.  The wounds were all irrigated with normal saline solution.  The proximal three wounds were closed in layers using #1 Vicryl on the gluteal fascia and iliotibial band.  The remainder of the wound was closed with 2-0 Vicryl and staples.  The entire wound series was then cleaned, dried and dressed sterilely using Mepilex dressing.  She was brought to the recovery room in stable condition, extubated and tolerating the procedure well.  Findings were reviewed with her family.     Pietro Cassis Alvan Dame, M.D.     MDO/MEDQ  D:  04/02/2015  T:  04/03/2015  Job:  [id number]

## 2015-04-03 NOTE — Progress Notes (Signed)
OT Cancellation Note  Patient Details Name: [name] MRN: [id number] DOB: May 18, 1932   Cancelled Treatment:    Reason Eval/Treat Not Completed:  (OT screened) Pt's  current D/C plan is SNF. No apparent immediate acute care OT needs, therefore will defer OT to SNF. If OT eval is needed please call Acute Rehab Dept. at [phone] or text page OT at [phone].    Benito Mccreedy OTR/L [phone] 04/03/2015, 4:36 PM

## 2015-04-03 NOTE — Progress Notes (Signed)
PT Cancellation Note  Patient Details Name: [name] MRN: [id number] DOB: 07/08/1932   Cancelled Treatment:    Reason Eval/Treat Not Completed: Medical issues which prohibited therapy   [name]'s Hgb is currently 6.2; plans to transfuse today;   Will follow up later today after transfusion, as time allows;  Otherwise, will follow up for PT tomorrow;   Thank you,  Roney Marion, [place] Pager [phone] Office [phone]     Roney Marion [facility] 04/03/2015, 8:27 AM

## 2015-04-03 NOTE — Evaluation (Signed)
Physical Therapy Evaluation Patient Details Name: [name] MRN: [id number] DOB: 1932/09/19 Today's Date: 04/03/2015   History of Present Illness  [name] is an 81 year old female with renal cell carcinoma with brain mets; fell out of bed, sustained periprosthetic fracture; now s/p ORIF; 50%PWB  Clinical Impression   Patient is s/p above surgery resulting in functional limitations due to the deficits listed below (see PT Problem List).  Patient will benefit from skilled PT to increase their independence and safety with mobility to allow discharge to the venue listed below.    [name] was eager to move and participating, and seemed glad to be able to get OOB and move; I agree with SNF for rehab efforts at this point     Follow Up Recommendations SNF    Equipment Recommendations  Rolling walker with 5" wheels;3in1 (PT)    Recommendations for Other Services       Precautions / Restrictions Precautions Precautions: Fall Required Braces or Orthoses: Knee Immobilizer - Right (for comfort) Restrictions Weight Bearing Restrictions: Yes RLE Weight Bearing: Partial weight bearing RLE Partial Weight Bearing Percentage or Pounds: 50      Mobility  Bed Mobility Overal bed mobility: Needs Assistance;+2 for physical assistance Bed Mobility: Supine to Sit     Supine to sit: Mod assist;+2 for physical assistance     General bed mobility comments: Mod assist to elevate trunk to sit and square off hips at EOB; demonstrated good initiation of half-bridge to EOB  Transfers Overall transfer level: Needs assistance Equipment used: Rolling walker (2 wheeled) Transfers: Stand Pivot Transfers   Stand pivot transfers: +2 physical assistance;Max assist;Mod assist       General transfer comment: Max assist of two to reach fully upright standing to RW; Once up, [name] was able to keep good support on RW and LLE, and needed mod assist to pivot to recliner, and  mod assist to control descent to recliner  Ambulation/Gait                Stairs            Wheelchair Mobility    Modified Rankin (Stroke Patients Only)       Balance Overall balance assessment: Needs assistance   Sitting balance-Leahy Scale: Fair       Standing balance-Leahy Scale: Poor                               Pertinent Vitals/Pain Pain Assessment: Faces Faces Pain Scale: Hurts even more Pain Location: R hip with movement Pain Descriptors / Indicators: Aching;Grimacing Pain Intervention(s): Limited activity within patient's tolerance;Monitored during session;Repositioned    Home Living Family/patient expects to be discharged to:: Skilled nursing facility Living Arrangements: Alone                    Prior Function Level of Independence: Independent         Comments: Noted some recent family concern for safety at home     Hand Dominance        Extremity/Trunk Assessment   Upper Extremity Assessment: Generalized weakness           Lower Extremity Assessment: RLE deficits/detail RLE Deficits / Details: Grossly decr AROM and strength R hip, limited by pain postop       Communication   Communication: HOH  Cognition Arousal/Alertness: Awake/alert Behavior During Therapy: WFL for tasks assessed/performed Overall Cognitive Status: No  family/caregiver present to determine baseline cognitive functioning                      General Comments      Exercises        Assessment/Plan    PT Assessment Patient needs continued PT services  PT Diagnosis Difficulty walking;Acute pain   PT Problem List Decreased strength;Decreased range of motion;Decreased activity tolerance;Decreased balance;Decreased mobility;Decreased knowledge of use of DME;Pain;Decreased knowledge of precautions;Decreased safety awareness  PT Treatment Interventions DME instruction;Gait training;Functional mobility training;Therapeutic  activities;Therapeutic exercise;Patient/family education   PT Goals (Current goals can be found in the Care Plan section) Acute Rehab PT Goals Patient Stated Goal: did not state PT Goal Formulation: Patient unable to participate in goal setting Time For Goal Achievement: 04/17/15 Potential to Achieve Goals: Good    Frequency Min 3X/week   Barriers to discharge        Co-evaluation               End of Session Equipment Utilized During Treatment: Gait belt;Right knee immobilizer Activity Tolerance: Patient tolerated treatment well Patient left: in chair;with call bell/phone within reach Nurse Communication: Mobility status         Time: 3143-8887 PT Time Calculation (min) (ACUTE ONLY): 16 min   Charges:   PT Evaluation $Initial PT Evaluation Tier I: 1 Procedure     PT G CodesQuin Hoop 04/03/2015, 3:13 PM  Roney Marion, [place] Pager [phone] Office [phone]

## 2015-04-03 NOTE — Progress Notes (Signed)
Patient Demographics:    [name], is a 81 y.o. female, DOB - 09/01/1932, ZJQ:[id number]  Admit date - 04/01/2015   Admitting Physician Elmarie Shiley, MD  Outpatient Primary MD for the patient is Myrlene Broker, MD  LOS - 2   Chief Complaint  Patient presents with  . Leg Injury  . Fall        Subjective:    [name] today has, No headache, No chest pain, No abdominal pain - No Nausea, No new weakness tingling or numbness, No Cough - SOB.     Assessment  & Plan :     1. Mechanical fall with right femoral fracture. Orthopedics to evaluate, surgery will be only for pain control and palliative purposes. For now continue traction, continue pain control. Thereafter per orthopedics. Weightbearing 50% on the right leg per orthopedics, aspirin for DVT prophylaxis postop per orthopedics.   2. Stage IV RCC with now evidence of brain metastases. Discussed with her oncologist Dr. Alen Blew bedside, prognosis extremely poor, for now IV Decadron short-term for vasogenic edema, he recommends hospice, discussed with sister and patient's son, they both agree with it. Palliative care consulted.   3. Anemia of chronic disease along with perioperative blood loss related anemia requiring 1 unit of packed RBC transfusion on 04/03/2015, Will monitor H&H.   4. Early dementia. At risk for delirium, minimize narcotics and benzodiazepines.    Code Status : No CPR or intubation  Family Communication  : Sister, granddaughter and son x 2.  Disposition Plan  : TBD  Consults  :  Ortho, Pall care, Onco  Procedures  :    DVT Prophylaxis  :   SCDs     Lab Results  Component Value Date   PLT 194 04/03/2015    Inpatient Medications  Scheduled Meds: . sodium chloride   Intravenous Once  .  aspirin EC  325 mg Oral Q breakfast  . buPROPion  150 mg Oral Daily  . dexamethasone  8 mg Oral 3 times per day  . docusate sodium  100 mg Oral BID  . DULoxetine  30 mg Oral Daily  . furosemide  20 mg Intravenous Once  . megestrol  400 mg Oral BID  . pantoprazole (PROTONIX) IV  40 mg Intravenous Q24H   Continuous Infusions:   PRN Meds:.acetaminophen **OR** acetaminophen, alum & mag hydroxide-simeth, diphenhydrAMINE, HYDROcodone-acetaminophen, HYDROmorphone (DILAUDID) injection, methocarbamol (ROBAXIN)  IV, [DISCONTINUED] ondansetron **OR** ondansetron (ZOFRAN) IV, polyethylene glycol, traZODone  Antibiotics  :     Anti-infectives    Start     Dose/Rate Route Frequency Ordered Stop   04/02/15 2300  ceFAZolin (ANCEF) IVPB 1 g/50 mL premix     1 g 100 mL/hr over 30 Minutes Intravenous Every 6 hours 04/02/15 2044 04/03/15 0530   04/02/15 1730  ceFAZolin (ANCEF) IVPB 2 g/50 mL premix     2 g 100 mL/hr over 30 Minutes Intravenous  Once 04/02/15 1718 04/02/15 1820   04/02/15 1719  ceFAZolin (ANCEF) 2-3 GM-% IVPB SOLR    Comments:  Henrine Screws   : cabinet override      04/02/15 1719 04/03/15 0529        Objective:   Filed Vitals:   04/03/15 2409 04/03/15 7353  04/03/15 0730 04/03/15 1053  BP: '90/37 98/48 99/48 '$ 99/54  Pulse: 87 79 86 81  Temp: 98.2 F (36.8 C) 98 F (36.7 C) 98.4 F (36.9 C) 98.6 F (37 C)  TempSrc: Oral Oral Oral Oral  Resp: '18 18 18 18  '$ SpO2: 97% 96% 96% 97%    Wt Readings from Last 3 Encounters:  01/04/15 44.316 kg (97 lb 11.2 oz)  11/24/14 43.908 kg (96 lb 12.8 oz)  07/26/14 44.951 kg (99 lb 1.6 oz)     Intake/Output Summary (Last 24 hours) at 04/03/15 1059 Last data filed at 04/03/15 1053  Gross per 24 hour  Intake   1442 ml  Output    650 ml  Net    792 ml     Physical Exam  Awake, hard of hearing and pleasantly confused, No new F.N deficits, Normal affect [place].AT,PERRAL Supple Neck,No JVD, No cervical lymphadenopathy appriciated.    Symmetrical Chest wall movement, Good air movement bilaterally, CTAB RRR,No Gallops,Rubs or new Murmurs, No Parasternal Heave +ve B.Sounds, Abd Soft, No tenderness, No organomegaly appriciated, No rebound - guarding or rigidity. No Cyanosis, Clubbing or edema, No new Rash or bruise , right leg in traction    Data Review:   Micro Results Recent Results (from the past 240 hour(s))  Surgical pcr screen     Status: Abnormal   Collection Time: 04/02/15  6:19 AM  Result Value Ref Range Status   MRSA, PCR NEGATIVE NEGATIVE Final   Staphylococcus aureus POSITIVE (A) NEGATIVE Final    Comment:        The Xpert SA Assay (FDA approved for NASAL specimens in patients over 28 years of age), is one component of a comprehensive surveillance program.  Test performance has been validated by [facility] for patients greater than or equal to 3 year old. It is not intended to diagnose infection nor to guide or monitor treatment.     Radiology Reports Ct Head Wo Contrast  04/01/2015  CLINICAL DATA:  81 year old female with history of trauma from a fall this morning with laceration to the right temporal region. Head and neck pain. History of renal cell carcinoma status post nephrectomy with known metastatic disease. EXAM: CT HEAD WITHOUT CONTRAST CT CERVICAL SPINE WITHOUT CONTRAST TECHNIQUE: Multidetector CT imaging of the head and cervical spine was performed following the standard protocol without intravenous contrast. Multiplanar CT image reconstructions of the cervical spine were also generated. COMPARISON:  No priors. FINDINGS: CT HEAD FINDINGS There several high attenuation lesions throughout the brain, concerning for hemorrhagic metastases. These include a 6 mm lesion in the right frontal lobe (image 14 of series 2), an 8 mm lesion in the inferior left frontal lobe (image 11 of series 2), a 7 x 9 mm lesion in the left occipital lobe (image 17 of series 2), and a 5 x 7 mm lesion in the superior  aspect of the right cerebellar hemisphere (image 11 of series 2). These are surrounded by areas of low attenuation, presumably some associated vasogenic edema. Patchy and confluent areas of decreased attenuation are noted throughout the deep and periventricular white matter of the cerebral hemispheres bilaterally, compatible with chronic microvascular ischemic disease. Mild cerebral atrophy. No definite acute intra-axial or extra-axial hemorrhage. No hydrocephalus. No acute displaced skull fractures. Visualized paranasal sinuses and mastoids are well pneumatized. CT CERVICAL SPINE FINDINGS No acute displaced fracture of the cervical spine. Alignment is anatomic. Prevertebral soft tissues are normal. Multilevel degenerative disc disease, most pronounced at C3-C4,  C4-C5, C5-C6 and C6-C7. Mild multilevel facet arthropathy. Visualized portions of the upper thorax demonstrate some bilateral apical pleuroparenchymal thickening, most compatible with chronic post infectious or inflammatory scarring. IMPRESSION: 1. No signs of significant acute traumatic injury to the skull, brain or cervical spine. 2. However, there are 4 small high attenuation lesions in the brain, as detailed above, surrounded by extensive vasogenic edema, presumably multifocal metastatic lesions in this patient with known metastatic renal cell carcinoma. 3. Chronic microvascular ischemic changes and mild cerebral atrophy also noted. 4. Multilevel degenerative disc disease and cervical spondylosis, as above. Electronically Signed   By: Vinnie Langton M.D.   On: 04/01/2015 14:54   Ct Cervical Spine Wo Contrast  04/01/2015  CLINICAL DATA:  81 year old female with history of trauma from a fall this morning with laceration to the right temporal region. Head and neck pain. History of renal cell carcinoma status post nephrectomy with known metastatic disease. EXAM: CT HEAD WITHOUT CONTRAST CT CERVICAL SPINE WITHOUT CONTRAST TECHNIQUE: Multidetector CT  imaging of the head and cervical spine was performed following the standard protocol without intravenous contrast. Multiplanar CT image reconstructions of the cervical spine were also generated. COMPARISON:  No priors. FINDINGS: CT HEAD FINDINGS There several high attenuation lesions throughout the brain, concerning for hemorrhagic metastases. These include a 6 mm lesion in the right frontal lobe (image 14 of series 2), an 8 mm lesion in the inferior left frontal lobe (image 11 of series 2), a 7 x 9 mm lesion in the left occipital lobe (image 17 of series 2), and a 5 x 7 mm lesion in the superior aspect of the right cerebellar hemisphere (image 11 of series 2). These are surrounded by areas of low attenuation, presumably some associated vasogenic edema. Patchy and confluent areas of decreased attenuation are noted throughout the deep and periventricular white matter of the cerebral hemispheres bilaterally, compatible with chronic microvascular ischemic disease. Mild cerebral atrophy. No definite acute intra-axial or extra-axial hemorrhage. No hydrocephalus. No acute displaced skull fractures. Visualized paranasal sinuses and mastoids are well pneumatized. CT CERVICAL SPINE FINDINGS No acute displaced fracture of the cervical spine. Alignment is anatomic. Prevertebral soft tissues are normal. Multilevel degenerative disc disease, most pronounced at C3-C4, C4-C5, C5-C6 and C6-C7. Mild multilevel facet arthropathy. Visualized portions of the upper thorax demonstrate some bilateral apical pleuroparenchymal thickening, most compatible with chronic post infectious or inflammatory scarring. IMPRESSION: 1. No signs of significant acute traumatic injury to the skull, brain or cervical spine. 2. However, there are 4 small high attenuation lesions in the brain, as detailed above, surrounded by extensive vasogenic edema, presumably multifocal metastatic lesions in this patient with known metastatic renal cell carcinoma. 3.  Chronic microvascular ischemic changes and mild cerebral atrophy also noted. 4. Multilevel degenerative disc disease and cervical spondylosis, as above. Electronically Signed   By: Vinnie Langton M.D.   On: 04/01/2015 14:54   Dg Chest Port 1 View  04/01/2015  CLINICAL DATA:  Postoperative radiograph. Current history of metastatic lung cancer. Initial encounter. EXAM: PORTABLE CHEST 1 VIEW COMPARISON:  CT of the chest performed 07/19/2014 FINDINGS: Scattered nodules are again noted within both lungs, the largest of which measures 2.0 cm at the right lung base. This has increased in size from 1.5 cm in March. The left costophrenic angle is incompletely imaged on this study. Pulmonary vascularity is at the upper limits of normal. Mild left basilar atelectasis is noted. No pneumothorax is seen. The cardiomediastinal silhouette is borderline enlarged. No acute  osseous abnormalities are identified. IMPRESSION: 1. Scattered nodules within both lungs, measuring up to 2.0 cm at the right lung base, increased in size from March. This is compatible with the patient's known metastatic disease to the lungs. 2. Mild left basilar atelectasis noted. 3. Borderline cardiomegaly. Electronically Signed   By: Garald Balding M.D.   On: 04/01/2015 18:23   Dg C-arm 61-120 Min  04/02/2015  CLINICAL DATA:  81 year old female -ORIF right femur fracture. EXAM: DG C-ARM 61-120 MIN; RIGHT FEMUR 2 VIEWS COMPARISON:  04/01/2015 FINDINGS: Three intraoperative views of the right femur are submitted postoperatively. An intramedullary rod and screws are noted traversing a spiral fracture of the mid right femur. Minimal lateral and anterior displacement noted with near-anatomic alignment. Complicating hardware features are noted. IMPRESSION: ORIF right femur fracture as described. Electronically Signed   By: Margarette Canada M.D.   On: 04/02/2015 19:42   Dg Femur 1v Right  04/01/2015  CLINICAL DATA:  Fall this morning EXAM: RIGHT FEMUR 1 VIEW  COMPARISON:  None. FINDINGS: Two views of the right femur submitted. There is displaced spiral fracture of mid shaft of the right femur below the level of intra medullary rod. Metallic fixation pin and intra medullary rod is noted in proximal right femur. There is no hip dislocation. Diffuse osteopenia. IMPRESSION: There is displaced spiral fracture of mid shaft of the right femur below the level of intra medullary rod. Metallic fixation pin and intra medullary rod is noted in proximal right femur. There is no hip dislocation. Electronically Signed   By: Lahoma Crocker M.D.   On: 04/01/2015 14:27   Dg Femur, Min 2 Views Right  04/02/2015  CLINICAL DATA:  81 year old female -ORIF right femur fracture. EXAM: DG C-ARM 61-120 MIN; RIGHT FEMUR 2 VIEWS COMPARISON:  04/01/2015 FINDINGS: Three intraoperative views of the right femur are submitted postoperatively. An intramedullary rod and screws are noted traversing a spiral fracture of the mid right femur. Minimal lateral and anterior displacement noted with near-anatomic alignment. Complicating hardware features are noted. IMPRESSION: ORIF right femur fracture as described. Electronically Signed   By: Margarette Canada M.D.   On: 04/02/2015 19:42     CBC  Recent Labs Lab 04/01/15 1456 04/01/15 1935 04/02/15 0351 04/03/15 0359  WBC 8.2 8.0 6.4 10.4  HGB 8.9* 9.2* 9.3* 6.2*  HCT 27.8* 28.2* 28.7* 19.5*  PLT 224 230 260 194  MCV 89.4 88.7 88.3 88.2  MCH 28.6 28.9 28.6 28.1  MCHC 32.0 32.6 32.4 31.8  RDW 13.0 13.0 13.1 13.4  LYMPHSABS 0.3*  --   --   --   MONOABS 0.3  --   --   --   EOSABS 0.0  --   --   --   BASOSABS 0.0  --   --   --     Chemistries   Recent Labs Lab 04/01/15 1456 04/02/15 0351 04/03/15 0359  NA 134* 134* 137  K 4.0 4.1 4.4  CL 105 104 110  CO2 22 24 20*  GLUCOSE 134* 192* 127*  BUN 19 21* 24*  CREATININE 0.96 1.02* 1.08*  CALCIUM 8.3* 8.4* 7.8*    ------------------------------------------------------------------------------------------------------------------ CrCl cannot be calculated (Unknown ideal weight.). ------------------------------------------------------------------------------------------------------------------ No results for input(s): HGBA1C in the last 72 hours. ------------------------------------------------------------------------------------------------------------------ No results for input(s): CHOL, HDL, LDLCALC, TRIG, CHOLHDL, LDLDIRECT in the last 72 hours. ------------------------------------------------------------------------------------------------------------------ No results for input(s): TSH, T4TOTAL, T3FREE, THYROIDAB in the last 72 hours.  Invalid input(s): FREET3 ------------------------------------------------------------------------------------------------------------------ No results for input(s): VITAMINB12,  FOLATE, FERRITIN, TIBC, IRON, RETICCTPCT in the last 72 hours.  Coagulation profile  Recent Labs Lab 04/01/15 1456 04/02/15 0351  INR 1.33 1.25    No results for input(s): DDIMER in the last 72 hours.  Cardiac Enzymes No results for input(s): CKMB, TROPONINI, MYOGLOBIN in the last 168 hours.  Invalid input(s): CK ------------------------------------------------------------------------------------------------------------------ Invalid input(s): POCBNP   Time Spent in minutes  35   SINGH,PRASHANT K M.D on 04/03/2015 at 10:59 AM  Between 7am to 7pm - Pager - [phone]  After 7pm go to [email or web address] - password [facility]  [facility] -  Office  [phone]

## 2015-04-03 NOTE — Progress Notes (Signed)
CRITICAL VALUE ALERT  Critical value received:  Hgb 6.2  Date of notification: 04/03/2015  Time of notification:  0612  Critical value read back: Yes  Nurse who received alert: Royston Sinner RN  MD notified (1st page):  Dr. Alvan Dame and Dr. Hilbert Bible  Time of first page: results verbally given to Dr. Alvan Dame at [phone] and paged to Dr. Hilbert Bible as well  MD notified (2nd page):  Time of second page:  Responding MD:  Dr. Alvan Dame and Dr. Hilbert Bible  Time MD responded:  Dr. Danice Goltz, Dr. Hilbert Bible [phone]

## 2015-04-03 NOTE — Consult Note (Signed)
Met with Lovell Sheehan [facility]) son and Museum/gallery conservator (grand daughter)   There short term goal if for Mrs. Prezioso to go to SNF, preferrably [facility], for some rehab and establish and new normal for her.  They realize that the cancer has mets and that time may be more limited than they had expected.  It is unclear at this point whether she will be able to return home with supervised care along with hospice support or require LTC when she declines.  For know this is there plan.  I will continue to support the family with regards to poor prognosis and transition to SNF with as much information regarding supportive services as possible.  [name] would like to speak to Dr. Alen Blew to verify the dx of mets and his prognosis regarding the cancer.  Call placed to [facility], message left for Dr. Alen Blew to call [name] with information as requested.  Will follow until discharge with support.  Patient appears to be recovering well from surgery.  Sitting up in chair visiting with family.  Kizzie Fantasia, RN-BC, MSN, [facility]

## 2015-04-03 NOTE — Progress Notes (Signed)
Patient BP 76/40 with Dinamap and 70/30 when checked manually this morning, patient resting comfortably in bed with no cardiac complaints, HR 80's, patient 94-97% on 2 L oxygen via [place], Dr. Hilbert Bible notified and 500 mL normal saline bolus ordered and administered, BP rechecked when bolus finished and 82/40, Dr. Hilbert Bible notified and second 500 mL normal saline bolus ordered and administered with BP 89/39 with dina map and 92/48 manually when complete, Dr. Hilbert Bible notified of final blood pressure after completion of second bolus with no further interventions, CBC to be drawn in morning, patient with no complaints at this time, resting comfortably, will continue to monitor closely.

## 2015-04-04 DIAGNOSIS — D509 Iron deficiency anemia, unspecified: Secondary | ICD-10-CM | POA: Insufficient documentation

## 2015-04-04 LAB — BASIC METABOLIC PANEL
Anion gap: 6 (ref 5–15)
BUN: 21 mg/dL — AB (ref 6–20)
CALCIUM: 8.3 mg/dL — AB (ref 8.9–10.3)
CO2: 25 mmol/L (ref 22–32)
CREATININE: 1.24 mg/dL — AB (ref 0.44–1.00)
Chloride: 107 mmol/L (ref 101–111)
GFR calc non Af Amer: 40 mL/min — ABNORMAL LOW (ref >60)
GFR, EST AFRICAN AMERICAN: 46 mL/min — AB (ref >60)
Glucose, Bld: 124 mg/dL — ABNORMAL HIGH (ref 65–99)
Potassium: 3.8 mmol/L (ref 3.5–5.1)
SODIUM: 138 mmol/L (ref 135–145)

## 2015-04-04 LAB — CBC
HCT: 24.5 % — ABNORMAL LOW (ref 36.0–46.0)
Hemoglobin: 7.9 g/dL — ABNORMAL LOW (ref 12.0–15.0)
MCH: 28 pg (ref 26.0–34.0)
MCHC: 32.2 g/dL (ref 30.0–36.0)
MCV: 86.9 fL (ref 78.0–100.0)
PLATELETS: 185 10*3/uL (ref 150–400)
RBC: 2.82 MIL/uL — AB (ref 3.87–5.11)
RDW: 13.6 % (ref 11.5–15.5)
WBC: 8 10*3/uL (ref 4.0–10.5)

## 2015-04-04 MED ORDER — DIPHENHYDRAMINE HCL 25 MG PO CAPS: 25.0000 mg | ORAL_CAPSULE | Freq: Four times a day (QID) | ORAL | Status: DC | PRN

## 2015-04-04 MED ORDER — DEXAMETHASONE 4 MG PO TABS: 4.0000 mg | ORAL_TABLET | Freq: Every day | ORAL | Status: AC

## 2015-04-04 MED ORDER — PANTOPRAZOLE SODIUM 40 MG PO TBEC: 40.0000 mg | DELAYED_RELEASE_TABLET | Freq: Every day | ORAL | Status: DC

## 2015-04-04 MED ORDER — POLYETHYLENE GLYCOL 3350 17 G PO PACK: 17.0000 g | PACK | Freq: Every day | ORAL | Status: DC | PRN

## 2015-04-04 NOTE — Progress Notes (Signed)
Physical Therapy Treatment Patient Details Name: [name] MRN: [id number] DOB: 07-21-1932 Today's Date: 04/04/2015    History of Present Illness Ms. Matsumura is an 81 year old female with renal cell carcinoma with brain mets; fell out of bed, sustained periprosthetic fracture; now s/p ORIF; 50%PWB    PT Comments    Ms. Bittel was asleep when PT arrived, but agreeable to getting up and in the chair to eat breakfast; Very painful RLE, and tends to keep minimal to no weight through it with transfers; employed basic pivot transfer to the chair as the RW seemed to be in the way with the pivot transfer on eval; Will need RW for amb, and plan initial amb attempts next session;  Continue to agree with SNF for post-acute rehab  Follow Up Recommendations  SNF     Equipment Recommendations  Rolling walker with 5" wheels;3in1 (PT)    Recommendations for Other Services       Precautions / Restrictions Precautions Precautions: Fall Required Braces or Orthoses: Knee Immobilizer - Right (for comfort) Restrictions Weight Bearing Restrictions: Yes RLE Weight Bearing: Partial weight bearing RLE Partial Weight Bearing Percentage or Pounds: 50    Mobility  Bed Mobility Overal bed mobility: Needs Assistance;+2 for physical assistance Bed Mobility: Supine to Sit     Supine to sit: Mod assist;+2 for physical assistance     General bed mobility comments: Mod assist to elevate trunk to sit and square off hips at EOB; demonstrated good initiation of half-bridge to EOB  Transfers Overall transfer level: Needs assistance   Transfers: Stand Pivot Transfers   Stand pivot transfers: +2 physical assistance;Max assist;Mod assist       General transfer comment: Max assist of two to reach fully upright; Once up, Ms. Schamp was able to keep good support on RW and LLE, and needed mod assist to pivot to recliner, and mod assist to control descent to recliner  Ambulation/Gait                  Stairs            Wheelchair Mobility    Modified Rankin (Stroke Patients Only)       Balance     Sitting balance-Leahy Scale: Fair       Standing balance-Leahy Scale: Poor                      Cognition Arousal/Alertness: Awake/alert Behavior During Therapy: WFL for tasks assessed/performed Overall Cognitive Status: No family/caregiver present to determine baseline cognitive functioning                      Exercises General Exercises - Lower Extremity Quad Sets: AROM;Right;5 reps Heel Slides: AAROM;Left;5 reps (Stopped therex early as it was quite painful) Hip ABduction/ADduction: AAROM;5 reps (Stopped therex early as it was quite painful)    General Comments        Pertinent Vitals/Pain Pain Assessment: Faces Faces Pain Scale: Hurts even more Pain Location: R hip with attempts at ROM Pain Descriptors / Indicators: Grimacing;Guarding Pain Intervention(s): Limited activity within patient's tolerance;Monitored during session;Repositioned    Home Living                      Prior Function            PT Goals (current goals can now be found in the care plan section) Acute Rehab PT Goals Patient Stated Goal: did not state PT  Goal Formulation: Patient unable to participate in goal setting Time For Goal Achievement: 04/17/15 Potential to Achieve Goals: Good Progress towards PT goals: Progressing toward goals (slowly)    Frequency  Min 3X/week    PT Plan Current plan remains appropriate    Co-evaluation             End of Session Equipment Utilized During Treatment: Gait belt;Right knee immobilizer Activity Tolerance: Patient tolerated treatment well Patient left: in chair;with call bell/phone within reach;with chair alarm set;with family/visitor present     Time: 1020-1039 PT Time Calculation (min) (ACUTE ONLY): 19 min  Charges:  $Therapeutic Activity: 8-22 mins                    G  Codes:      Quin Hoop 04/04/2015, 12:24 PM  Roney Marion, [place] Pager [phone] Office [phone]

## 2015-04-04 NOTE — Care Management Note (Signed)
Case Management Note  Patient Details  Name: [name] MRN: [id number] Date of Birth: 1932-07-04  Subjective/Objective:        Admitted with right femur fracture, S/p ORIF 04/02/15            Action/Plan: Met with son,granddaughter and [name] from Palliative care on 04/03/15 to discuss discharge plan. Patient and family want acute rehab at [facility], PT is recommending SNF. Explained SNF placement process to family and made referral to [place]. Informed CSW that patient's preference is [facility]. CSW working on SNF placement. Will continue to follow.    Expected Discharge Date:                  Expected Discharge Plan:  [place]  In-House Referral:  Clinical Social Work, Hospice / Palliative Care, NA  Discharge planning Services  CM Consult  Post Acute Care Choice:    Choice offered to:     DME Arranged:    DME Agency:     HH Arranged:    HH Agency:     Status of Service:  In process, will continue to follow  Medicare Important Message Given:    Date Medicare IM Given:    Medicare IM give by:    Date Additional Medicare IM Given:    Additional Medicare Important Message give by:     If discussed at [facility] Meetings, dates discussed:    Additional Comments:  Nila Nephew, RN 04/04/2015, 10:27 AM

## 2015-04-04 NOTE — Progress Notes (Signed)
Patien can now be discharged to [facility] and rehab. [facility] called and arrived to pick up patient. Son updated and agreeable to discharged

## 2015-04-04 NOTE — Discharge Summary (Addendum)
[name], is a 81 y.o. female  DOB 1933-01-14  MRN [id number].  Admission date:  04/01/2015  Admitting Physician  Elmarie Shiley, MD  Discharge Date:  04/04/2015   Primary MD  Myrlene Broker, MD  Recommendations for primary care physician for things to follow:   Check CBC BMP in a week, continue gentle medical treatment.  Follow with her oncologist Dr. Osker Mason within a week.  Follow-up with orthopedics in 1-2 weeks.   Admission Diagnosis  Iron deficiency anemia [D50.9] Surgery follow-up [Z09] Pre-op evaluation [Z01.818] Renal cell carcinoma, right (HCC) [C64.1] Primary malignant neoplasm of lung metastatic to other site, unspecified laterality ([place]) [C34.90] Closed fracture of right femur, unspecified fracture morphology, unspecified portion of femur, initial encounter ([place]) [S72.91XA]   Discharge Diagnosis  Iron deficiency anemia [D50.9] Surgery follow-up [Z09] Pre-op evaluation [Z01.818] Renal cell carcinoma, right (HCC) [C64.1] Primary malignant neoplasm of lung metastatic to other site, unspecified laterality ([place]) [C34.90] Closed fracture of right femur, unspecified fracture morphology, unspecified portion of femur, initial encounter ([place]) [S72.91XA]     Principal Problem:   Femur fracture, right ([place]) Active Problems:   Renal cell cancer ([place])   Primary malignant neoplasm of lung metastatic to other site [facility])   Anemia   Brain lesion   Femur fracture (HCC)   Iron deficiency anemia      Past Medical History  Diagnosis Date  . Depression   . Nausea   . Metastatic lung cancer   . Anemia   . History of colon polyps   . Renal cell carcinoma dx'd 2001    right   . Renal cancer ([place]) dx'd 09/2010    left  . Femur fracture [facility])     right    Past Surgical History    Procedure Laterality Date  . Nephrectomy      right  . Rotator cuff repair      right  . Foot surgery    . Lung lobectomy    . Total hip arthroplasty    . Eus  04/10/2011    Procedure: UPPER ENDOSCOPIC ULTRASOUND (EUS) LINEAR;  Surgeon: Owens Loffler, MD;  Location: [facility];  Service: Endoscopy;  Laterality: N/A;  . Femur im nail Right 04/02/2015    Procedure: INTRAMEDULLARY (IM) NAIL FEMORAL;  Surgeon: Paralee Cancel, MD;  Location: [place];  Service: Orthopedics;  Laterality: Right;  . Hardware removal Right 04/02/2015    Procedure: HARDWARE REMOVAL;  Surgeon: Paralee Cancel, MD;  Location: [place];  Service: Orthopedics;  Laterality: Right;       HPI  from the history and physical done on the day of admission:    [name] is a very pleasant 81 y.o. female with a past medical history that includes renal cell carcinoma with lung metastases, anemia, depression presents to the emergency department from home after a mechanical fall with chief complaint right leg pain. Initial evaluation in the emergency department reveals right femur fracture.  Information obtained from the patient who reports getting up  this morning sitting on the side of bed and getting her feet tangled in the sheet when she got up to go the bathroom. She fell she hit her head. She denies losing consciousness. She reports feeling immediate pain in her right leg and was unable to get up. She was noted to have an obvious right femur fracture at the scene and placed in traction. She denies any chest pain palpitation headache dizziness syncope or near-syncope. She denies any recent illness fever chills diarrhea nausea vomiting sick contacts. She reports maintaining oral intake.  Workup in the emergency department includes x-ray of right leg there is displaced spiral fracture of mid shaft of the right femur below the level of intra medullary rod. Metallic fixation pin and intra medullary rod is noted in proximal right  femur. There is no hip dislocation. Lab work reveals an INR within the limits of normal CBC with a hemoglobin of 8.9 hematocrit 27.8 down from 12.52 months ago, basic metabolic panel was a sodium of 134 serum glucose 134.  She is afebrile and hemodynamically stable while in the emergency department. She is provided with Buck's traction, knee immobilize.     Hospital Course:     1. Mechanical fall with right femoral fracture. Orthopedics to evaluate, underwent right hip surgical correction for pain control purposes, per orthopedics Weightbearing 50% on the right leg , aspirin for DVT prophylaxis postop per orthopedics. Seen by PT will qualify for SNF per family wishes will be discharged to SNF.   2. Stage IV RCC with now evidence of brain metastases. Discussed with her oncologist Dr. Alen Blew bedside, prognosis extremely poor, has been placed on Decadron short-term for vasogenic edema, he recommends hospice, discussed with sister and patient's son, palliative care saw the patient and had discussions with family, at this time family wants another appointment with Dr. Osker Mason, they want to continue gentle medical treatment. Once they have spoken to Dr. Osker Mason one more time they will likely gradually transition to hospice.  Kindly note patient has been placed on 4 mg of Decadron orally, she must follow with her oncologist within 1-2 weeks thereafter Decadron taper per oncology.  Note I had multiple discussions with patient's family which included patient's sister, patient, granddaughter, son twice. However I think son has some problems grasping the situation and time and again he says he still does not understand what is going on with his mother. He wanted patient's oncologist to see his mother and was told that she had been seen the first day itself by the oncologist and he had a detailed discussion with patient in my presence. But he fails to believe that.  He did get abusive to me and was politely  told not to do so.   3. Anemia of chronic disease along with perioperative blood loss related anemia requiring 1 unit of packed RBC transfusion on 04/03/2015, stable posttransfusion H&H.   4. Early dementia and hearing loss. At risk for delirium, minimize narcotics and benzodiazepines.    CODE STATUS no CPR or intubation.   Discharge Condition: Guarded  Follow UP  Follow-up Information    Follow up with Mauri Pole, MD In 2 weeks.   Specialty:  Orthopedic Surgery   Why:  For suture removal   Contact information:   [address] [place] [address] [phone]       Follow up with ROBBINS,ROBERT A, MD. Schedule an appointment as soon as possible for a visit in 1 week.   Specialty:  Family Medicine   Contact information:   [place] [address]       Follow up with [facility], MD. Schedule an appointment as soon as possible for a visit in 1 week.   Specialty:  Oncology   Why:  Renal cell cancer with brain metastases   Contact information:   [address] [address] [phone]        Consults obtained - Oncology, Naoma Diener care  Diet and Activity recommendation: See Discharge Instructions below  Discharge Instructions       Discharge Instructions    Discharge instructions    Complete by:  As directed   PWB RLE Knee immobilizer on for activity Keep wounds dry until staples out.  Follow with Primary MD ROBBINS,ROBERT A, MD in 7 days   Get CBC, CMP, 2 view Chest X ray checked  by Primary MD next visit.    Activity: Partial weightbearing on the right lower leg as tolerated with Full fall precautions use walker/cane & assistance as needed   Disposition SNF   Diet: Heart Healthy  with feeding assistance and aspiration precautions.  For Heart failure patients - Check your Weight same time everyday, if you gain over 2 pounds, or you develop in leg swelling, experience more shortness of breath or chest  pain, call your Primary MD immediately. Follow Cardiac Low Salt Diet and 1.5 lit/day fluid restriction.   On your next visit with your primary care physician please Get Medicines reviewed and adjusted.   Please request your Prim.MD to go over all Hospital Tests and Procedure/Radiological results at the follow up, please get all Hospital records sent to your Prim MD by signing hospital release before you go home.   If you experience worsening of your admission symptoms, develop shortness of breath, life threatening emergency, suicidal or homicidal thoughts you must seek medical attention immediately by calling 911 or calling your MD immediately  if symptoms less severe.  You Must read complete instructions/literature along with all the possible adverse reactions/side effects for all the Medicines you take and that have been prescribed to you. Take any new Medicines after you have completely understood and accpet all the possible adverse reactions/side effects.   Do not drive, operating heavy machinery, perform activities at heights, swimming or participation in water activities or provide baby sitting services if your were admitted for syncope or siezures until you have seen by Primary MD or a Neurologist and advised to do so again.  Do not drive when taking Pain medications.    Do not take more than prescribed Pain, Sleep and Anxiety Medications  Special Instructions: If you have smoked or chewed Tobacco  in the last 2 yrs please stop smoking, stop any regular Alcohol  and or any Recreational drug use.  Wear Seat belts while driving.   Please note  You were cared for by a hospitalist during your hospital stay. If you have any questions about your discharge medications or the care you received while you were in the hospital after you are discharged, you can call the unit and asked to speak with the hospitalist on call if the hospitalist that took care of you is not available. Once you are  discharged, your primary care physician will handle any further medical issues. Please note that NO REFILLS for any discharge medications will be authorized once you are discharged, as it is imperative that you return to your primary care physician (or establish a relationship  with a primary care physician if you do not have one) for your aftercare needs so that they can reassess your need for medications and monitor your lab values.     Increase activity slowly    Complete by:  As directed      Partial weight bearing    Complete by:  As directed   50%             Discharge Medications       Medication List    TAKE these medications        aspirin EC 325 MG tablet  Take 1 tablet (325 mg total) by mouth daily.     buPROPion 150 MG 24 hr tablet  Commonly known as:  WELLBUTRIN XL  Take 150 mg by mouth daily.     CALCIUM-VITAMIN D PO  Take 1 tablet by mouth daily.     dexamethasone 4 MG tablet  Commonly known as:  DECADRON  Take 1 tablet (4 mg total) by mouth daily.     DULoxetine 30 MG capsule  Commonly known as:  CYMBALTA  Take 30 mg by mouth daily.     Fish Oil 1000 MG Caps  Take 1,000 mg by mouth 2 (two) times daily.     HYDROcodone-acetaminophen 5-325 MG tablet  Commonly known as:  NORCO  Take 1-2 tablets by mouth every 6 (six) hours as needed.     megestrol 400 MG/10ML suspension  Commonly known as:  MEGACE  Take 10 mLs (400 mg total) by mouth 2 (two) times daily.     multivitamin with minerals Tabs tablet  Take 1 tablet by mouth daily.     polyethylene glycol packet  Commonly known as:  MIRALAX / GLYCOLAX  Take 17 g by mouth daily as needed for mild constipation.        Major procedures and Radiology Reports - PLEASE review detailed and final reports for all details, in brief -       Ct Head Wo Contrast  04/01/2015  CLINICAL DATA:  81 year old female with history of trauma from a fall this morning with laceration to the right temporal region. Head  and neck pain. History of renal cell carcinoma status post nephrectomy with known metastatic disease. EXAM: CT HEAD WITHOUT CONTRAST CT CERVICAL SPINE WITHOUT CONTRAST TECHNIQUE: Multidetector CT imaging of the head and cervical spine was performed following the standard protocol without intravenous contrast. Multiplanar CT image reconstructions of the cervical spine were also generated. COMPARISON:  No priors. FINDINGS: CT HEAD FINDINGS There several high attenuation lesions throughout the brain, concerning for hemorrhagic metastases. These include a 6 mm lesion in the right frontal lobe (image 14 of series 2), an 8 mm lesion in the inferior left frontal lobe (image 11 of series 2), a 7 x 9 mm lesion in the left occipital lobe (image 17 of series 2), and a 5 x 7 mm lesion in the superior aspect of the right cerebellar hemisphere (image 11 of series 2). These are surrounded by areas of low attenuation, presumably some associated vasogenic edema. Patchy and confluent areas of decreased attenuation are noted throughout the deep and periventricular white matter of the cerebral hemispheres bilaterally, compatible with chronic microvascular ischemic disease. Mild cerebral atrophy. No definite acute intra-axial or extra-axial hemorrhage. No hydrocephalus. No acute displaced skull fractures. Visualized paranasal sinuses and mastoids are well pneumatized. CT CERVICAL SPINE FINDINGS No acute displaced fracture of the cervical spine. Alignment is anatomic. Prevertebral soft tissues are normal.  Multilevel degenerative disc disease, most pronounced at C3-C4, C4-C5, C5-C6 and C6-C7. Mild multilevel facet arthropathy. Visualized portions of the upper thorax demonstrate some bilateral apical pleuroparenchymal thickening, most compatible with chronic post infectious or inflammatory scarring. IMPRESSION: 1. No signs of significant acute traumatic injury to the skull, brain or cervical spine. 2. However, there are 4 small high  attenuation lesions in the brain, as detailed above, surrounded by extensive vasogenic edema, presumably multifocal metastatic lesions in this patient with known metastatic renal cell carcinoma. 3. Chronic microvascular ischemic changes and mild cerebral atrophy also noted. 4. Multilevel degenerative disc disease and cervical spondylosis, as above. Electronically Signed   By: Vinnie Langton M.D.   On: 04/01/2015 14:54   Ct Cervical Spine Wo Contrast  04/01/2015  CLINICAL DATA:  81 year old female with history of trauma from a fall this morning with laceration to the right temporal region. Head and neck pain. History of renal cell carcinoma status post nephrectomy with known metastatic disease. EXAM: CT HEAD WITHOUT CONTRAST CT CERVICAL SPINE WITHOUT CONTRAST TECHNIQUE: Multidetector CT imaging of the head and cervical spine was performed following the standard protocol without intravenous contrast. Multiplanar CT image reconstructions of the cervical spine were also generated. COMPARISON:  No priors. FINDINGS: CT HEAD FINDINGS There several high attenuation lesions throughout the brain, concerning for hemorrhagic metastases. These include a 6 mm lesion in the right frontal lobe (image 14 of series 2), an 8 mm lesion in the inferior left frontal lobe (image 11 of series 2), a 7 x 9 mm lesion in the left occipital lobe (image 17 of series 2), and a 5 x 7 mm lesion in the superior aspect of the right cerebellar hemisphere (image 11 of series 2). These are surrounded by areas of low attenuation, presumably some associated vasogenic edema. Patchy and confluent areas of decreased attenuation are noted throughout the deep and periventricular white matter of the cerebral hemispheres bilaterally, compatible with chronic microvascular ischemic disease. Mild cerebral atrophy. No definite acute intra-axial or extra-axial hemorrhage. No hydrocephalus. No acute displaced skull fractures. Visualized paranasal sinuses and  mastoids are well pneumatized. CT CERVICAL SPINE FINDINGS No acute displaced fracture of the cervical spine. Alignment is anatomic. Prevertebral soft tissues are normal. Multilevel degenerative disc disease, most pronounced at C3-C4, C4-C5, C5-C6 and C6-C7. Mild multilevel facet arthropathy. Visualized portions of the upper thorax demonstrate some bilateral apical pleuroparenchymal thickening, most compatible with chronic post infectious or inflammatory scarring. IMPRESSION: 1. No signs of significant acute traumatic injury to the skull, brain or cervical spine. 2. However, there are 4 small high attenuation lesions in the brain, as detailed above, surrounded by extensive vasogenic edema, presumably multifocal metastatic lesions in this patient with known metastatic renal cell carcinoma. 3. Chronic microvascular ischemic changes and mild cerebral atrophy also noted. 4. Multilevel degenerative disc disease and cervical spondylosis, as above. Electronically Signed   By: Vinnie Langton M.D.   On: 04/01/2015 14:54   Dg Chest Port 1 View  04/01/2015  CLINICAL DATA:  Postoperative radiograph. Current history of metastatic lung cancer. Initial encounter. EXAM: PORTABLE CHEST 1 VIEW COMPARISON:  CT of the chest performed 07/19/2014 FINDINGS: Scattered nodules are again noted within both lungs, the largest of which measures 2.0 cm at the right lung base. This has increased in size from 1.5 cm in March. The left costophrenic angle is incompletely imaged on this study. Pulmonary vascularity is at the upper limits of normal. Mild left basilar atelectasis is noted. No pneumothorax is seen.  The cardiomediastinal silhouette is borderline enlarged. No acute osseous abnormalities are identified. IMPRESSION: 1. Scattered nodules within both lungs, measuring up to 2.0 cm at the right lung base, increased in size from March. This is compatible with the patient's known metastatic disease to the lungs. 2. Mild left basilar  atelectasis noted. 3. Borderline cardiomegaly. Electronically Signed   By: Garald Balding M.D.   On: 04/01/2015 18:23   Dg C-arm 61-120 Min  04/02/2015  CLINICAL DATA:  81 year old female -ORIF right femur fracture. EXAM: DG C-ARM 61-120 MIN; RIGHT FEMUR 2 VIEWS COMPARISON:  04/01/2015 FINDINGS: Three intraoperative views of the right femur are submitted postoperatively. An intramedullary rod and screws are noted traversing a spiral fracture of the mid right femur. Minimal lateral and anterior displacement noted with near-anatomic alignment. Complicating hardware features are noted. IMPRESSION: ORIF right femur fracture as described. Electronically Signed   By: Margarette Canada M.D.   On: 04/02/2015 19:42   Dg Femur 1v Right  04/01/2015  CLINICAL DATA:  Fall this morning EXAM: RIGHT FEMUR 1 VIEW COMPARISON:  None. FINDINGS: Two views of the right femur submitted. There is displaced spiral fracture of mid shaft of the right femur below the level of intra medullary rod. Metallic fixation pin and intra medullary rod is noted in proximal right femur. There is no hip dislocation. Diffuse osteopenia. IMPRESSION: There is displaced spiral fracture of mid shaft of the right femur below the level of intra medullary rod. Metallic fixation pin and intra medullary rod is noted in proximal right femur. There is no hip dislocation. Electronically Signed   By: Lahoma Crocker M.D.   On: 04/01/2015 14:27   Dg Femur, Min 2 Views Right  04/02/2015  CLINICAL DATA:  81 year old female -ORIF right femur fracture. EXAM: DG C-ARM 61-120 MIN; RIGHT FEMUR 2 VIEWS COMPARISON:  04/01/2015 FINDINGS: Three intraoperative views of the right femur are submitted postoperatively. An intramedullary rod and screws are noted traversing a spiral fracture of the mid right femur. Minimal lateral and anterior displacement noted with near-anatomic alignment. Complicating hardware features are noted. IMPRESSION: ORIF right femur fracture as described.  Electronically Signed   By: Margarette Canada M.D.   On: 04/02/2015 19:42    Micro Results      Recent Results (from the past 240 hour(s))  Surgical pcr screen     Status: Abnormal   Collection Time: 04/02/15  6:19 AM  Result Value Ref Range Status   MRSA, PCR NEGATIVE NEGATIVE Final   Staphylococcus aureus POSITIVE (A) NEGATIVE Final    Comment:        The Xpert SA Assay (FDA approved for NASAL specimens in patients over 6 years of age), is one component of a comprehensive surveillance program.  Test performance has been validated by [facility] for patients greater than or equal to 73 year old. It is not intended to diagnose infection nor to guide or monitor treatment.        Today   Subjective    [name] today has no headache,no chest abdominal pain,no new weakness tingling or numbness, feels much better wants to go home today.     Objective   Blood pressure 108/50, pulse 80, temperature 97.1 F (36.2 C), temperature source Oral, resp. rate 19, SpO2 97 %.   Intake/Output Summary (Last 24 hours) at 04/04/15 0915 Last data filed at 04/04/15 0300  Gross per 24 hour  Intake 718.67 ml  Output   1000 ml  Net -281.33 ml  Exam Awake , visibly confused and hard of hearing, Oriented x 1, No new F.N deficits, Normal affect [place].AT,PERRAL Supple Neck,No JVD, No cervical lymphadenopathy appriciated.  Symmetrical Chest wall movement, Good air movement bilaterally, CTAB RRR,No Gallops,Rubs or new Murmurs, No Parasternal Heave +ve B.Sounds, Abd Soft, Non tender, No organomegaly appriciated, No rebound -guarding or rigidity. No Cyanosis, Clubbing or edema, No new Rash or bruise, Right hip scar appears stable   Data Review   CBC w Diff: Lab Results  Component Value Date   WBC 8.0 04/04/2015   WBC 5.2 01/04/2015   HGB 7.9* 04/04/2015   HGB 12.5 01/04/2015   HCT 24.5* 04/04/2015   HCT 38.0 01/04/2015   PLT 185 04/04/2015   PLT 281 01/04/2015   LYMPHOPCT 4  04/01/2015   LYMPHOPCT 21.6 01/04/2015   MONOPCT 3 04/01/2015   MONOPCT 9.9 01/04/2015   EOSPCT 0 04/01/2015   EOSPCT 5.0 01/04/2015   BASOPCT 0 04/01/2015   BASOPCT 1.0 01/04/2015    CMP: Lab Results  Component Value Date   NA 138 04/04/2015   NA 140 01/04/2015   NA 144 07/16/2011   K 3.8 04/04/2015   K 4.6 01/04/2015   K 4.8* 07/16/2011   CL 107 04/04/2015   CL 108* 10/05/2012   CL 100 07/16/2011   CO2 25 04/04/2015   CO2 26 01/04/2015   CO2 28 07/16/2011   BUN 21* 04/04/2015   BUN 25.0 01/04/2015   BUN 22 07/16/2011   CREATININE 1.24* 04/04/2015   CREATININE 1.3* 01/04/2015   CREATININE 1.3* 07/16/2011   PROT 6.8 01/04/2015   PROT 6.1 10/21/2011   PROT 6.7 07/16/2011   ALBUMIN 3.4* 01/04/2015   ALBUMIN 3.8 10/21/2011   ALBUMIN 3.5 07/16/2011   BILITOT 0.32 01/04/2015   BILITOT 0.4 10/21/2011   BILITOT 0.70 07/16/2011   ALKPHOS 91 01/04/2015   ALKPHOS 81 10/21/2011   ALKPHOS 107* 07/16/2011   AST 19 01/04/2015   AST 16 10/21/2011   AST 29 07/16/2011   ALT 9 01/04/2015   ALT <8 10/21/2011   ALT 17 07/16/2011  .   Total Time in preparing paper work, data evaluation and todays exam - 35 minutes  Thurnell Lose M.D on 04/04/2015 at [place]  [phone]

## 2015-04-04 NOTE — Clinical Social Work Note (Signed)
Clinical Social Work Assessment  Patient Details  Name: [name] MRN: [id number] Date of Birth: [birth year]  Date of referral:  04/04/15               Reason for consult:  Facility Placement, Discharge Planning                Permission sought to share information with:  Facility Sport and exercise psychologist, Family Supports Permission granted to share information::   (Patient disoriented per chart.)  Name::     [name]  Agency::  [facility] ([place] area preferred)  Relationship::  Adult son  Contact Information:  [phone]  Housing/Transportation Living arrangements for the past 2 months:  Single Family Home Source of Information:  Adult Children Patient Interpreter Needed:  None Criminal Activity/Legal Involvement Pertinent to Current Situation/Hospitalization:  No - Comment as needed Significant Relationships:  Adult Children, Siblings Lives with:  Self Do you feel safe going back to the place where you live?  No (High fall risk.) Need for family participation in patient care:  Yes (Comment) (Patient's son, [name], active in patient's care.)  Care giving concerns:  Patient's son expressed no concerns at this time.   Social Worker assessment / plan:  CSW received referral stating patient's disposition had changed from home with home health services to SNF placement. CSW attempted to meet with patient's son at bedside (at patient's son request), however, patient's son not present at bedside and patient asleep in the bed. CSW contacted patient's son via phone to discuss discharge planning for patient. Per patient's son, patient's family would prefer for patient to be discharged to [facility] once medically stable for discharge. CSW informed patient's son that [facility] currently has no bed availability. Patient's son expressed understanding and agreeable to CSW completing SNF search in the [place] area. CSW informed patient's son of MD discharge orders for  patient on 04/04/2015, patient's son expressed understanding of patient being discharged on 04/04/2015 once SNF bed found. CSW to continue to follow and assist with discharge planning needs.  Employment status:  Retired Forensic scientist:  [employer]) PT Recommendations:  [place] / Referral to community resources:  [place]  Patient/Family's Response to care:  Patient's son understanding and agreeable to CSW plan of care.  Patient/Family's Understanding of and Emotional Response to Diagnosis, Current Treatment, and Prognosis:  Patient's son understanding and agreeable to CSW plan of care.  Emotional Assessment Appearance:  Other (Comment Required (Patient disoriented per chart, CSW spoke with patient's son.) Attitude/Demeanor/Rapport:  Other (Patient disoriented per chart, CSW spoke with patient's son.) Affect (typically observed):  Other (Patient disoriented per chart, CSW spoke with patient's son.) Orientation:   (Disoriented per chart.) Alcohol / Substance use:  Not Applicable Psych involvement (Current and /or in the community):  No (Comment) (Not appropriate on this admission.)  Discharge Needs  Concerns to be addressed:  No discharge needs identified Readmission within the last 30 days:  No Current discharge risk:  None Barriers to Discharge:  No Barriers Identified   Caroline Sauger, LCSW 04/04/2015, 11:48 AM

## 2015-04-04 NOTE — Discharge Instructions (Signed)
PWB RLE Knee immobilizer on for activity Keep wounds dry until staples out.  Follow with Primary MD ROBBINS,ROBERT A, MD in 7 days   Get CBC, CMP, 2 view Chest X ray checked  by Primary MD next visit.    Activity: Partial weightbearing on the right lower leg as tolerated with Full fall precautions use walker/cane & assistance as needed   Disposition SNF   Diet: Heart Healthy  with feeding assistance and aspiration precautions.  For Heart failure patients - Check your Weight same time everyday, if you gain over 2 pounds, or you develop in leg swelling, experience more shortness of breath or chest pain, call your Primary MD immediately. Follow Cardiac Low Salt Diet and 1.5 lit/day fluid restriction.   On your next visit with your primary care physician please Get Medicines reviewed and adjusted.   Please request your Prim.MD to go over all Hospital Tests and Procedure/Radiological results at the follow up, please get all Hospital records sent to your Prim MD by signing hospital release before you go home.   If you experience worsening of your admission symptoms, develop shortness of breath, life threatening emergency, suicidal or homicidal thoughts you must seek medical attention immediately by calling 911 or calling your MD immediately  if symptoms less severe.  You Must read complete instructions/literature along with all the possible adverse reactions/side effects for all the Medicines you take and that have been prescribed to you. Take any new Medicines after you have completely understood and accpet all the possible adverse reactions/side effects.   Do not drive, operating heavy machinery, perform activities at heights, swimming or participation in water activities or provide baby sitting services if your were admitted for syncope or siezures until you have seen by Primary MD or a Neurologist and advised to do so again.  Do not drive when taking Pain medications.    Do not take  more than prescribed Pain, Sleep and Anxiety Medications  Special Instructions: If you have smoked or chewed Tobacco  in the last 2 yrs please stop smoking, stop any regular Alcohol  and or any Recreational drug use.  Wear Seat belts while driving.   Please note  You were cared for by a hospitalist during your hospital stay. If you have any questions about your discharge medications or the care you received while you were in the hospital after you are discharged, you can call the unit and asked to speak with the hospitalist on call if the hospitalist that took care of you is not available. Once you are discharged, your primary care physician will handle any further medical issues. Please note that NO REFILLS for any discharge medications will be authorized once you are discharged, as it is imperative that you return to your primary care physician (or establish a relationship with a primary care physician if you do not have one) for your aftercare needs so that they can reassess your need for medications and monitor your lab values.

## 2015-04-04 NOTE — Clinical Social Work Placement (Signed)
   CLINICAL SOCIAL WORK PLACEMENT  NOTE  Date:  04/04/2015  Patient Details  Name: [name] MRN: [id number] Date of Birth: 05-Jan-1933  Clinical Social Work is seeking post-discharge placement for this patient at the [place] level of care (*CSW will initial, date and re-position this form in  chart as items are completed):  Yes   Patient/family provided with [place] Work Department's list of facilities offering this level of care within the geographic area requested by the patient (or if unable, by the patient's family).  Yes   Patient/family informed of their freedom to choose among providers that offer the needed level of care, that participate in Medicare, Medicaid or managed care program needed by the patient, have an available bed and are willing to accept the patient.  Yes   Patient/family informed of [place]'s ownership interest in [facility] and [facility], as well as of the fact that they are under no obligation to receive care at these facilities.  PASRR submitted to EDS on       PASRR number received on       Existing PASRR number confirmed on 04/04/15     FL2 transmitted to all facilities in geographic area requested by pt/family on 04/04/15     FL2 transmitted to all facilities within larger geographic area on       Patient informed that his/her managed care company has contracts with or will negotiate with certain facilities, including the following:            Patient/family informed of bed offers received.  Patient chooses bed at       Physician recommends and patient chooses bed at      Patient to be transferred to   on  .  Patient to be transferred to facility by       Patient family notified on   of transfer.  Name of family member notified:        PHYSICIAN Please sign FL2     Additional Comment:    _______________________________________________ Caroline Sauger, LCSW 04/04/2015,  11:51 AM

## 2015-04-04 NOTE — Clinical Social Work Note (Signed)
PASARR #: [id number]

## 2015-04-04 NOTE — NC FL2 (Signed)
[place] LEVEL OF CARE SCREENING TOOL     IDENTIFICATION  Patient Name: [name] Birthdate: 1932-12-11 Sex: female Admission Date (Current Location): 04/01/2015  [facility] and [place] Number:     Facility and Address:  [facility] [place] [address], [place], [place] [address]      Provider Number: [id number]  Attending Physician Name and Address:  Thurnell Lose, MD  Relative Name and Phone Number:  [name]: [phone]    Current Level of Care: Hospital Recommended Level of Care: [place] Prior Approval Number:    Date Approved/Denied:   PASRR Number:    Discharge Plan: SNF    Current Diagnoses: Patient Active Problem List   Diagnosis Date Noted  . Iron deficiency anemia   . Femur fracture, right ([place]) 04/01/2015  . Brain lesion 04/01/2015  . Femur fracture ([place]) 04/01/2015  . Primary malignant neoplasm of lung metastatic to other site [facility])   . Renal cell carcinoma ([place])   . Anemia   . Closed fracture of right femur ([place])   . Renal cell cancer ([place]) 01/07/2011  . Dyspnea 08/19/2010  . Postural dizziness 08/19/2010    Orientation ACTIVITIES/SOCIAL BLADDER RESPIRATION     (Disoriented to time.)  Family supportive Continent Normal  BEHAVIORAL SYMPTOMS/MOOD NEUROLOGICAL BOWEL NUTRITION STATUS  Other (Comment) (n/a)  (n/a) Continent  (Please see discharge summary.)  PHYSICIAN VISITS COMMUNICATION OF NEEDS Height & Weight Skin    Verbally  (Not documented.) 97 lbs. Surgical wounds, Other (Comment) (Laceration on head.)          AMBULATORY STATUS RESPIRATION    Assist extensive Normal      Personal Care Assistance Level of Assistance  Bathing, Feeding, Dressing Bathing Assistance: Limited assistance Feeding assistance: Independent Dressing Assistance: Limited assistance      Functional Limitations Info   (n/a)             [place]  PT (By licensed PT), OT (By  licensed OT)     PT Frequency: 5 OT Frequency: 5           Additional Factors Info  Code Status, Allergies Code Status Info: Partial Allergies Info: No known allergies           Current Medications (04/04/2015):  This is the current hospital active medication list Current Facility-Administered Medications  Medication Dose Route Frequency Provider Last Rate Last Dose  . acetaminophen (TYLENOL) tablet 650 mg  650 mg Oral Q6H PRN Paralee Cancel, MD       Or  . acetaminophen (TYLENOL) suppository 650 mg  650 mg Rectal Q6H PRN Paralee Cancel, MD      . alum & mag hydroxide-simeth (MAALOX/MYLANTA) 200-200-20 MG/5ML suspension 30 mL  30 mL Oral Q4H PRN Paralee Cancel, MD      . aspirin EC tablet 325 mg  325 mg Oral Q breakfast Paralee Cancel, MD   325 mg at 04/04/15 1111  . buPROPion (WELLBUTRIN XL) 24 hr tablet 150 mg  150 mg Oral Daily Radene Gunning, NP   150 mg at 04/04/15 1111  . dexamethasone (DECADRON) tablet 4 mg  4 mg Oral Q12H Thurnell Lose, MD   4 mg at 04/04/15 1111  . diphenhydrAMINE (BENADRYL) injection 25 mg  25 mg Intravenous Q6H PRN Thurnell Lose, MD      . docusate sodium (COLACE) capsule 100 mg  100 mg Oral BID Paralee Cancel, MD   100 mg  at 04/04/15 1111  . DULoxetine (CYMBALTA) DR capsule 30 mg  30 mg Oral Daily Radene Gunning, NP   30 mg at 04/04/15 1111  . HYDROcodone-acetaminophen (NORCO/VICODIN) 5-325 MG per tablet 1 tablet  1 tablet Oral Q6H PRN Thurnell Lose, MD      . HYDROmorphone (DILAUDID) injection 0.5 mg  0.5 mg Intravenous Q2H PRN Radene Gunning, NP   0.5 mg at 04/02/15 2211  . megestrol (MEGACE) 400 MG/10ML suspension 400 mg  400 mg Oral BID Radene Gunning, NP   400 mg at 04/04/15 1116  . methocarbamol (ROBAXIN) 500 mg in dextrose 5 % 50 mL IVPB  500 mg Intravenous Q6H PRN Radene Gunning, NP      . ondansetron [facility]) injection 4 mg  4 mg Intravenous Q6H PRN Paralee Cancel, MD      . pantoprazole (PROTONIX) injection 40 mg  40 mg Intravenous Q24H Radene Gunning,  NP   40 mg at 04/03/15 2111  . polyethylene glycol (MIRALAX / GLYCOLAX) packet 17 g  17 g Oral Daily PRN Paralee Cancel, MD      . traZODone (DESYREL) tablet 25 mg  25 mg Oral QHS PRN Radene Gunning, NP         Discharge Medications: Please see discharge summary for a list of discharge medications.  Relevant Imaging Results:  Relevant Lab Results:  Recent Labs    Additional Information Social Security #: [id number]  Luna Kitchens [phone]

## 2015-04-04 NOTE — Discharge Planning (Signed)
Patient to be discharged to [place]. Patient's son, [name], updated regarding discharge.  Facility: [facility] and Rehab RN report number: [phone] Transportation: [place], [place] [facility]: [phone] Surgical: [phone]

## 2015-04-04 NOTE — Progress Notes (Signed)
Called to give report [facility] and rehab, patient was not accepted for admission due to Hgb 7.9.  Dr Candiss Norse via Text Page

## 2015-04-04 NOTE — Clinical Social Work Placement (Signed)
   CLINICAL SOCIAL WORK PLACEMENT  NOTE  Date:  04/04/2015  Patient Details  Name: [name] MRN: [id number] Date of Birth: [birth year]  Clinical Social Work is seeking post-discharge placement for this patient at the [place] level of care (*CSW will initial, date and re-position this form in  chart as items are completed):  Yes   Patient/family provided with [place] Work Department's list of facilities offering this level of care within the geographic area requested by the patient (or if unable, by the patient's family).  Yes   Patient/family informed of their freedom to choose among providers that offer the needed level of care, that participate in Medicare, Medicaid or managed care program needed by the patient, have an available bed and are willing to accept the patient.  Yes   Patient/family informed of [place]'s ownership interest in [facility] and [facility], as well as of the fact that they are under no obligation to receive care at these facilities.  PASRR submitted to EDS on       PASRR number received on       Existing PASRR number confirmed on 04/04/15     FL2 transmitted to all facilities in geographic area requested by pt/family on 04/04/15     FL2 transmitted to all facilities within larger geographic area on       Patient informed that his/her managed care company has contracts with or will negotiate with certain facilities, including the following:        Yes   Patient/family informed of bed offers received.  Patient chooses bed at [facility] and [place] recommends and patient chooses bed at      Patient to be transferred to [facility] on 04/04/15.  Patient to be transferred to facility by PTAR     Patient family notified on 04/04/15 of transfer.  Name of family member notified:  Todd     PHYSICIAN       Additional Comment:     _______________________________________________ Caroline Sauger, LCSW 04/04/2015, 2:40 PM

## 2015-04-04 NOTE — Care Management Important Message (Signed)
Important Message  Patient Details  Name: [name] MRN: [id number] Date of Birth: [birth year]   Medicare Important Message Given:  Yes    Nathen May 04/04/2015, 3:51 PM

## 2015-04-05 LAB — TYPE AND SCREEN
ABO/RH(D): A POS
Antibody Screen: NEGATIVE
Unit division: 0
Unit division: 0

## 2015-04-10 ENCOUNTER — Other Ambulatory Visit: Payer: PPO

## 2015-04-10 ENCOUNTER — Ambulatory Visit: Payer: PPO | Admitting: Oncology

## 2015-04-17 ENCOUNTER — Telehealth: Payer: Self-pay | Admitting: Oncology

## 2015-04-17 NOTE — Telephone Encounter (Signed)
Ricka Burdock from [facility] and [facility] called for an appointment. Rescheduled lab/FS from 12/6 and gave [facility] new appointment for lab/FS 12/27 @ 11:30 am.

## 2015-05-01 ENCOUNTER — Ambulatory Visit (HOSPITAL_BASED_OUTPATIENT_CLINIC_OR_DEPARTMENT_OTHER): Payer: PPO | Admitting: Oncology

## 2015-05-01 ENCOUNTER — Ambulatory Visit (HOSPITAL_COMMUNITY)
Admission: RE | Admit: 2015-05-01 | Discharge: 2015-05-01 | Disposition: A | Discharge status: Home / Self Care | Payer: PPO | Source: Ambulatory Visit | Attending: Anesthesiology | Admitting: Anesthesiology

## 2015-05-01 ENCOUNTER — Encounter (HOSPITAL_COMMUNITY)
Admission: RE | Admit: 2015-05-01 | Discharge: 2015-05-01 | Disposition: A | Discharge status: Home / Self Care | Payer: PPO | Source: Ambulatory Visit | Attending: Orthopedic Surgery | Admitting: Orthopedic Surgery

## 2015-05-01 ENCOUNTER — Other Ambulatory Visit: Payer: PPO

## 2015-05-01 ENCOUNTER — Telehealth: Payer: Self-pay | Admitting: Oncology

## 2015-05-01 ENCOUNTER — Encounter (HOSPITAL_COMMUNITY): Payer: Self-pay

## 2015-05-01 VITALS — BP 115/54 | HR 84 | Temp 98.1°F | Resp 16

## 2015-05-01 DIAGNOSIS — R9389 Abnormal findings on diagnostic imaging of other specified body structures: Secondary | ICD-10-CM

## 2015-05-01 DIAGNOSIS — S72491P Other fracture of lower end of right femur, subsequent encounter for closed fracture with malunion: Secondary | ICD-10-CM | POA: Diagnosis not present

## 2015-05-01 DIAGNOSIS — C7931 Secondary malignant neoplasm of brain: Secondary | ICD-10-CM

## 2015-05-01 DIAGNOSIS — C649 Malignant neoplasm of unspecified kidney, except renal pelvis: Secondary | ICD-10-CM | POA: Diagnosis not present

## 2015-05-01 HISTORY — DX: Personal history of other medical treatment: Z92.89

## 2015-05-01 HISTORY — DX: Malignant neoplasm of brain, unspecified: C71.9

## 2015-05-01 HISTORY — DX: Difficulty in walking, not elsewhere classified: R26.2

## 2015-05-01 HISTORY — DX: Muscle weakness (generalized): M62.81

## 2015-05-01 HISTORY — DX: Repeated falls: R29.6

## 2015-05-01 HISTORY — DX: Malignant neoplasm of unspecified part of unspecified bronchus or lung: C34.90

## 2015-05-01 LAB — CBC
HEMATOCRIT: 32.1 % — AB (ref 36.0–46.0)
HEMOGLOBIN: 10 g/dL — AB (ref 12.0–15.0)
MCH: 29.2 pg (ref 26.0–34.0)
MCHC: 31.2 g/dL (ref 30.0–36.0)
MCV: 93.6 fL (ref 78.0–100.0)
Platelets: 315 10*3/uL (ref 150–400)
RBC: 3.43 MIL/uL — AB (ref 3.87–5.11)
RDW: 16.4 % — ABNORMAL HIGH (ref 11.5–15.5)
WBC: 8.4 10*3/uL (ref 4.0–10.5)

## 2015-05-01 LAB — BASIC METABOLIC PANEL
ANION GAP: 8 (ref 5–15)
BUN: 63 mg/dL — ABNORMAL HIGH (ref 6–20)
CHLORIDE: 109 mmol/L (ref 101–111)
CO2: 21 mmol/L — AB (ref 22–32)
Calcium: 9 mg/dL (ref 8.9–10.3)
Creatinine, Ser: 1.09 mg/dL — ABNORMAL HIGH (ref 0.44–1.00)
GFR calc non Af Amer: 47 mL/min — ABNORMAL LOW (ref >60)
GFR, EST AFRICAN AMERICAN: 54 mL/min — AB (ref >60)
Glucose, Bld: 104 mg/dL — ABNORMAL HIGH (ref 65–99)
POTASSIUM: 4.5 mmol/L (ref 3.5–5.1)
SODIUM: 138 mmol/L (ref 135–145)

## 2015-05-01 LAB — APTT: aPTT: 26 seconds (ref 24–37)

## 2015-05-01 LAB — SURGICAL PCR SCREEN
MRSA, PCR: POSITIVE — AB
STAPHYLOCOCCUS AUREUS: POSITIVE — AB

## 2015-05-01 LAB — PROTIME-INR
INR: 1.18 (ref 0.00–1.49)
Prothrombin Time: 15.2 seconds (ref 11.6–15.2)

## 2015-05-01 NOTE — Progress Notes (Signed)
Hematology and Oncology Follow Up Visit  [name] [id number] June 29, 1932 81 y.o. 05/01/2015 12:33 PM  CC: [name], M.D.  [name], M.D.   Principle Diagnosis: This is a 81 year old female with renal cell carcinoma initially diagnosed with stage IIIB in August 2001.  She has developed a lung metastasis with stage IV disease in 2007. And recently found to have  Disease with pulmonary, pancreatic and lung nodules.  Prior Therapy: 1.         She had underwent a radical nephrectomy in August 2001.  She had a stage IIIB disease. 2. The patient developed pulmonary metastases in July 2007 status post surgical resection.  She has not had any recurrent disease since that time. 3.         S/P Cryoablation of a left kidney mass done in 09/2010 Done by IR. 4.         S/P recent FNA of a pancreatic lesion by Dr. Ardis Hughs. Pathology indicate metastatic renal cell cancer in 04/2011. 5.         Status post intramedullary nail placement done on 04/02/2015 after a right femur fracture.   Current therapy: Supportive care only.   Interim History: [name] presents today for a followup visit with her family. Since, the last visit, she sustained a fall and resulted in a spiral fracture of the mid shaft of the right femur. She underwent an operation and intramedullary nail placement on 04/02/2015. During her hospitalization, she also had CT scan of the brain and neck because of her recent trauma and CT scan showed potentially metastatic disease in the brain. Patient was asymptomatic from these findings and did not receive any treatment. She was discharged from the hospital to rehabilitation facility and currently under evaluation for revision of her ORIF of the right femur to be performed by Dr. Alvan Dame on 05/03/2015.  Since her discharge, she has felt reasonably well. Her pain is reasonably controlled but not able to ambulate or bear weight at this time. She has not reported any  headaches, blurry vision, seizures. Her appetite had been reasonable and her quality of life is affected by her fracture but not dramatically changed. Her memory has declined and had episodic confusion related to her dementia.   She does not report any fevers, chills, sweats and weight is stable. She does not report any chest pain palpitation or leg edema. He does not report any dyspnea on exertion. She has not reported any constipation or diarrhea. She does not report any frequency urgency or hematuria. Remaining review of system is unremarkable.   Medications: Current Outpatient Prescriptions  Medication Sig Dispense Refill  . aspirin EC 325 MG tablet Take 1 tablet (325 mg total) by mouth daily. 30 tablet 0  . buPROPion (WELLBUTRIN XL) 150 MG 24 hr tablet Take 150 mg by mouth daily.    . calcium-vitamin D (OSCAL-500) 500-400 MG-UNIT tablet Take 1 tablet by mouth daily.    Marland Kitchen dexamethasone (DECADRON) 4 MG tablet Take 1 tablet (4 mg total) by mouth daily.    . DULoxetine (CYMBALTA) 30 MG capsule Take 30 mg by mouth daily.    . ferrous sulfate 325 (65 FE) MG tablet Take 325 mg by mouth daily with breakfast.    . HYDROcodone-acetaminophen (NORCO) 5-325 MG tablet Take 1-2 tablets by mouth every 6 (six) hours as needed. 90 tablet 0  . megestrol (MEGACE) 400 MG/10ML suspension Take 10 mLs (400 mg total) by mouth 2 (two) times daily. [place]  mL 0  . Multiple Vitamin (MULTIVITAMIN WITH MINERALS) TABS tablet Take 1 tablet by mouth daily.    . Omega-3 Fatty Acids (FISH OIL) 1000 MG CAPS Take 1,000 mg by mouth 2 (two) times daily.    . polyethylene glycol (MIRALAX / GLYCOLAX) packet Take 17 g by mouth daily as needed for mild constipation. 14 each 0   No current facility-administered medications for this visit.    Allergies: No Known Allergies  Past Medical History, Surgical history, Social history, and Family History were reviewed and updated.   Physical Exam: Blood pressure 115/54, pulse 84,  temperature 98.1 F (36.7 C), temperature source Oral, resp. rate 16, SpO2 100 %. ECOG: 2 General appearance: Elderly, frail woman. Without distress. Head: Normocephalic, without obvious abnormality no oral ulcers or lesions. Neck: no adenopathy Lymph nodes: Cervical, supraclavicular, and axillary nodes normal. Heart:regular rate and rhythm, S1, S2 normal, no murmur, click, rub or gallop Lung:chest clear, no wheezing, rales, normal symmetric air entry Abdomin: soft, non-tender, without masses or organomegaly EXT:no erythema, induration, or nodules. Her right leg appeared to be in a brace.   Lab Results: Lab Results  Component Value Date   WBC 8.4 05/01/2015   HGB 10.0* 05/01/2015   HCT 32.1* 05/01/2015   MCV 93.6 05/01/2015   PLT 315 05/01/2015     Chemistry      Component Value Date/Time   NA 138 05/01/2015 1110   NA 140 01/04/2015 1241   NA 144 07/16/2011 0928   K 4.5 05/01/2015 1110   K 4.6 01/04/2015 1241   K 4.8* 07/16/2011 0928   CL 109 05/01/2015 1110   CL 108* 10/05/2012 1454   CL 100 07/16/2011 0928   CO2 21* 05/01/2015 1110   CO2 26 01/04/2015 1241   CO2 28 07/16/2011 0928   BUN 63* 05/01/2015 1110   BUN 25.0 01/04/2015 1241   BUN 22 07/16/2011 0928   CREATININE 1.09* 05/01/2015 1110   CREATININE 1.3* 01/04/2015 1241   CREATININE 1.3* 07/16/2011 0928      Component Value Date/Time   CALCIUM 9.0 05/01/2015 1110   CALCIUM 9.4 01/04/2015 1241   CALCIUM 8.8 07/16/2011 0928   ALKPHOS 91 01/04/2015 1241   ALKPHOS 81 10/21/2011 0818   ALKPHOS 107* 07/16/2011 0928   AST 19 01/04/2015 1241   AST 16 10/21/2011 0818   AST 29 07/16/2011 0928   ALT 9 01/04/2015 1241   ALT <8 10/21/2011 0818   ALT 17 07/16/2011 0928   BILITOT 0.32 01/04/2015 1241   BILITOT 0.4 10/21/2011 0818   BILITOT 0.70 07/16/2011 0928     EXAM: CT HEAD WITHOUT CONTRAST  CT CERVICAL SPINE WITHOUT CONTRAST  TECHNIQUE: Multidetector CT imaging of the head and cervical spine  was performed following the standard protocol without intravenous contrast. Multiplanar CT image reconstructions of the cervical spine were also generated.  COMPARISON: No priors.  FINDINGS: CT HEAD FINDINGS  There several high attenuation lesions throughout the brain, concerning for hemorrhagic metastases. These include a 6 mm lesion in the right frontal lobe (image 14 of series 2), an 8 mm lesion in the inferior left frontal lobe (image 11 of series 2), a 7 x 9 mm lesion in the left occipital lobe (image 17 of series 2), and a 5 x 7 mm lesion in the superior aspect of the right cerebellar hemisphere (image 11 of series 2). These are surrounded by areas of low attenuation, presumably some associated vasogenic edema. Patchy and confluent areas of decreased  attenuation are noted throughout the deep and periventricular white matter of the cerebral hemispheres bilaterally, compatible with chronic microvascular ischemic disease. Mild cerebral atrophy. No definite acute intra-axial or extra-axial hemorrhage. No hydrocephalus. No acute displaced skull fractures. Visualized paranasal sinuses and mastoids are well pneumatized.  CT CERVICAL SPINE FINDINGS  No acute displaced fracture of the cervical spine. Alignment is anatomic. Prevertebral soft tissues are normal. Multilevel degenerative disc disease, most pronounced at C3-C4, C4-C5, C5-C6 and C6-C7. Mild multilevel facet arthropathy. Visualized portions of the upper thorax demonstrate some bilateral apical pleuroparenchymal thickening, most compatible with chronic post infectious or inflammatory scarring.  IMPRESSION: 1. No signs of significant acute traumatic injury to the skull, brain or cervical spine. 2. However, there are 4 small high attenuation lesions in the brain, as detailed above, surrounded by extensive vasogenic edema, presumably multifocal metastatic lesions in this patient with known metastatic renal cell  carcinoma. 3. Chronic microvascular ischemic changes and mild cerebral atrophy also noted. 4. Multilevel degenerative disc disease and cervical spondylosis, as above.  Impression and Plan:  This is a pleasant 81 year old female with the following issues: 1. Stage IV renal cell carcinoma, clear cell histology resected initially in 2001.  She had a metastasectomy in 2007 for a lung lesion.  She is also S/P Cryoablation of a Left renal mass. She also has documented pancreatic lesion biopsy proven to be renal in etiology. CT scans from 07/19/2014  showed progression of disease especially with pulmonary metastasis, adrenal metastasis and possible hepatic metastasis. She is not a candidate for any aggressive therapy and I have recommended continued supportive care and observation and surveillance. 2. Right femur fracture: She is status post ORIF and she will have a redo procedure on 05/03/2015. I have recommended proceeding with this procedure as it will impact her quality of life moving forward. Maintaining her ability to ambulate will go a long way in preserving some quality of life despite her cancer. 3. Brain metastasis: She is quite frail and asymptomatic from her brain metastasis. I have recommended supportive care only and defer radiation at this time. Her life expectancy is limited and I believe radiation therapy will offer very little palliation at this time.. 4.         Prognosis: Fair reported at this time given her progressive disease, frail status and limited performance status. I recommended hospice enrollment once she have recovered from surgery and her disposition is finalized. She will likely require placement in a school nursing facility and have hospice care provided assistance at that time. I believe her life expectancy is 6 months or less at this time. 5.         Pain: Appears to be adequately controlled at this time and not an issue. 6.         Poor memory and episodic confusion: Mostly  related to progressive dementia which makes her very poor candidate for any aggressive therapy. 7.         Follow-up: Will be in 3 months and sooner if needed to.  Zola Button, MD 12/27/201612:33 PM

## 2015-05-01 NOTE — Patient Instructions (Signed)
[name]  05/01/2015   Your procedure is scheduled on: Thursday May 03, 2015  Report to [facility]  Entrance take [place]  elevators to 3rd floor to  [place] at 12:45 PM.  Call this number if you have problems the morning of surgery [phone]   Remember: ONLY 1 PERSON MAY GO WITH YOU TO SHORT STAY TO GET  READY MORNING OF [place].  Do not eat foodAfter Midnight but may take clear liquids till 8:45 am day of surgery then nothing by mouth.      Take these medicines the morning of surgery with A SIP OF WATER: Bupropion (Wellbutrin); Duloxetine (CYmbalta); May have hydrocodone-acetaminophen if needed  DO NOT TAKE ANY DIABETIC MEDICATIONS DAY OF YOUR SURGERY                               You may not have any metal on your body including hair pins and              piercings  Do not wear jewelry, make-up, lotions, powders or perfumes, deodorant             Do not wear nail polish.  Do not shave  48 hours prior to surgery.               Do not bring valuables to the hospital. [place].  Contacts, dentures or bridgework may not be worn into surgery.  Leave suitcase in the car. After surgery it may be brought to your room.                Please read over the following fact sheets you were given:INCENTIVE SPIROMETER; BLOOD TRANSFUSION INFORMATION SHEET _____________________________________________________________________             [facility] - Preparing for Surgery Before surgery, you can play an important role.  Because skin is not sterile, your skin needs to be as free of germs as possible.  You can reduce the number of germs on your skin by washing with CHG (chlorahexidine gluconate) soap before surgery.  CHG is an antiseptic cleaner which kills germs and bonds with the skin to continue killing germs even after washing. Please DO NOT use if you have an allergy to CHG or  antibacterial soaps.  If your skin becomes reddened/irritated stop using the CHG and inform your nurse when you arrive at Short Stay. Do not shave (including legs and underarms) for at least 48 hours prior to the first CHG shower.  You may shave your face/neck. Please follow these instructions carefully:  1.  Shower with CHG Soap the night before surgery and the  morning of Surgery.  2.  If you choose to wash your hair, wash your hair first as usual with your  normal  shampoo.  3.  After you shampoo, rinse your hair and body thoroughly to remove the  shampoo.                           4.  Use CHG as you would any other liquid soap.  You can apply chg directly  to the skin and wash  Gently with a scrungie or clean washcloth.  5.  Apply the CHG Soap to your body ONLY FROM THE NECK DOWN.   Do not use on face/ open                           Wound or open sores. Avoid contact with eyes, ears mouth and genitals (private parts).                       Wash face,  Genitals (private parts) with your normal soap.             6.  Wash thoroughly, paying special attention to the area where your surgery  will be performed.  7.  Thoroughly rinse your body with warm water from the neck down.  8.  DO NOT shower/wash with your normal soap after using and rinsing off  the CHG Soap.                9.  Pat yourself dry with a clean towel.            10.  Wear clean pajamas.            11.  Place clean sheets on your bed the night of your first shower and do not  sleep with pets. Day of Surgery : Do not apply any lotions/deodorants the morning of surgery.  Please wear clean clothes to the hospital/surgery center.  FAILURE TO FOLLOW THESE INSTRUCTIONS MAY RESULT IN THE CANCELLATION OF YOUR SURGERY PATIENT SIGNATURE_________________________________  NURSE SIGNATURE__________________________________  ________________________________________________________________________    CLEAR LIQUID  DIET   Foods Allowed                                                                     Foods Excluded  Coffee and tea, regular and decaf                             liquids that you cannot  Plain Jell-O in any flavor                                             see through such as: Fruit ices (not with fruit pulp)                                     milk, soups, orange juice  Iced Popsicles                                    All solid food Carbonated beverages, regular and diet                                    Cranberry, grape and apple juices Sports drinks like Gatorade Lightly seasoned clear broth or consume(fat free) Sugar, honey syrup  Sample Menu Breakfast                                Lunch                                     Supper Cranberry juice                    Beef broth                            Chicken broth Jell-O                                     Grape juice                           Apple juice Coffee or tea                        Jell-O                                      Popsicle                                                Coffee or tea                        Coffee or tea  _____________________________________________________________________    Incentive Spirometer  An incentive spirometer is a tool that can help keep your lungs clear and active. This tool measures how well you are filling your lungs with each breath. Taking long deep breaths may help reverse or decrease the chance of developing breathing (pulmonary) problems (especially infection) following:  A long period of time when you are unable to move or be active. BEFORE THE PROCEDURE   If the spirometer includes an indicator to show your best effort, your nurse or respiratory therapist will set it to a desired goal.  If possible, sit up straight or lean slightly forward. Try not to slouch.  Hold the incentive spirometer in an upright position. INSTRUCTIONS FOR USE   Sit on the edge of  your bed if possible, or sit up as far as you can in bed or on a chair.  Hold the incentive spirometer in an upright position.  Breathe out normally.  Place the mouthpiece in your mouth and seal your lips tightly around it.  Breathe in slowly and as deeply as possible, raising the piston or the ball toward the top of the column.  Hold your breath for 3-5 seconds or for as long as possible. Allow the piston or ball to fall to the bottom of the column.  Remove the mouthpiece from your mouth and breathe out normally.  Rest for a few seconds and repeat Steps 1 through 7 at least 10 times every 1-2 hours when you are awake. Take your time and take a few normal breaths between  deep breaths.  The spirometer may include an indicator to show your best effort. Use the indicator as a goal to work toward during each repetition.  After each set of 10 deep breaths, practice coughing to be sure your lungs are clear. If you have an incision (the cut made at the time of surgery), support your incision when coughing by placing a pillow or rolled up towels firmly against it. Once you are able to get out of bed, walk around indoors and cough well. You may stop using the incentive spirometer when instructed by your caregiver.  RISKS AND COMPLICATIONS  Take your time so you do not get dizzy or light-headed.  If you are in pain, you may need to take or ask for pain medication before doing incentive spirometry. It is harder to take a deep breath if you are having pain. AFTER USE  Rest and breathe slowly and easily.  It can be helpful to keep track of a log of your progress. Your caregiver can provide you with a simple table to help with this. If you are using the spirometer at home, follow these instructions: [place] IF:   You are having difficultly using the spirometer.  You have trouble using the spirometer as often as instructed.  Your pain medication is not giving enough relief while using  the spirometer.  You develop fever of 100.5 F (38.1 C) or higher. SEEK IMMEDIATE MEDICAL CARE IF:   You cough up bloody sputum that had not been present before.  You develop fever of 102 F (38.9 C) or greater.  You develop worsening pain at or near the incision site. MAKE SURE YOU:   Understand these instructions.  Will watch your condition.  Will get help right away if you are not doing well or get worse. Document Released: 09/01/2006 Document Revised: 07/14/2011 Document Reviewed: 11/02/2006 Exit[employer] Patient Information 2014 Exit[employer], [place].   ________________________________________________________________________  WHAT IS A BLOOD TRANSFUSION? Blood Transfusion Information  A transfusion is the replacement of blood or some of its parts. Blood is made up of multiple cells which provide different functions.  Red blood cells carry oxygen and are used for blood loss replacement.  White blood cells fight against infection.  Platelets control bleeding.  Plasma helps clot blood.  Other blood products are available for specialized needs, such as hemophilia or other clotting disorders. BEFORE THE TRANSFUSION  Who gives blood for transfusions?   Healthy volunteers who are fully evaluated to make sure their blood is safe. This is blood bank blood. Transfusion therapy is the safest it has ever been in the practice of medicine. Before blood is taken from a donor, a complete history is taken to make sure that person has no history of diseases nor engages in risky social behavior (examples are intravenous drug use or sexual activity with multiple partners). The donor's travel history is screened to minimize risk of transmitting infections, such as malaria. The donated blood is tested for signs of infectious diseases, such as HIV and hepatitis. The blood is then tested to be sure it is compatible with you in order to minimize the chance of a transfusion reaction. If you or a relative  donates blood, this is often done in anticipation of surgery and is not appropriate for emergency situations. It takes many days to process the donated blood. RISKS AND COMPLICATIONS Although transfusion therapy is very safe and saves many lives, the main dangers of transfusion include:   Getting an infectious disease.  Developing a transfusion reaction. This is an allergic reaction to something in the blood you were given. Every precaution is taken to prevent this. The decision to have a blood transfusion has been considered carefully by your caregiver before blood is given. Blood is not given unless the benefits outweigh the risks. AFTER THE TRANSFUSION  Right after receiving a blood transfusion, you will usually feel much better and more energetic. This is especially true if your red blood cells have gotten low (anemic). The transfusion raises the level of the red blood cells which carry oxygen, and this usually causes an energy increase.  The nurse administering the transfusion will monitor you carefully for complications. HOME CARE INSTRUCTIONS  No special instructions are needed after a transfusion. You may find your energy is better. Speak with your caregiver about any limitations on activity for underlying diseases you may have. SEEK MEDICAL CARE IF:   Your condition is not improving after your transfusion.  You develop redness or irritation at the intravenous (IV) site. SEEK IMMEDIATE MEDICAL CARE IF:  Any of the following symptoms occur over the next 12 hours:  Shaking chills.  You have a temperature by mouth above 102 F (38.9 C), not controlled by medicine.  Chest, back, or muscle pain.  People around you feel you are not acting correctly or are confused.  Shortness of breath or difficulty breathing.  Dizziness and fainting.  You get a rash or develop hives.  You have a decrease in urine output.  Your urine turns a dark color or changes to pink, red, or brown. Any  of the following symptoms occur over the next 10 days:  You have a temperature by mouth above 102 F (38.9 C), not controlled by medicine.  Shortness of breath.  Weakness after normal activity.  The white part of the eye turns yellow (jaundice).  You have a decrease in the amount of urine or are urinating less often.  Your urine turns a dark color or changes to pink, red, or brown. Document Released: 04/18/2000 Document Revised: 07/14/2011 Document Reviewed: 12/06/2007 [facility] Patient Information 2014 [place], [place].  _______________________________________________________________________

## 2015-05-01 NOTE — Telephone Encounter (Signed)
Gave relative avs report and appointments for March.

## 2015-05-01 NOTE — Progress Notes (Addendum)
Attempted to obtain urinalysis during PAT visit 05/01/2015 per MD order. Unable to obtain due to pt urinating and having a BM mixed in. Will need to obtain day of surgery. Placed order back in epic and to obtain through catherization due to pt is incont also.  EKG reviewed per Dr Germeroth/anesthesia. No orders given. Anesthesia to see pt day of surgery.  ECHO per epic 08/21/2010

## 2015-05-02 ENCOUNTER — Inpatient Hospital Stay (HOSPITAL_COMMUNITY): Admission: RE | Admit: 2015-05-02 | Payer: PPO | Source: Ambulatory Visit

## 2015-05-02 NOTE — Progress Notes (Signed)
CBC, BMP and surgical screening results in epic per PAT visit 05/01/2015 sent to Dr Alvan Dame.

## 2015-05-02 NOTE — H&P (Signed)
[name] is an 81 y.o. female.    Procedure:    Revision of the previous ORIF of the right femur  Chief Complaint:    Failed right femur ORIF   HPI:       [name] is an 81 year old female who followed up in the clinic for her first postop visit, roughly 16 days out from an open reduction and internal fixation of her right distal periprosthetic femur fracture. This was a fracture below a previously placed trochanteric nail that Dr. Alvan Dame exchanged out for a longer nail.    She came in for routine followup. There have been no obvious postoperative complications that she can report despite the radiographic findings today. No fevers, chills, night sweats.   She is seen and evaluated in the office. She is in a wheelchair. She has been limited in weightbearing per our restrictions.  Knee immobilizer removed. Staples removed.   She has no signs of any wound infection or drainage.   Dr. Alvan Dame reviewed with [name] her current situation as well as her daughter that at this point, based on her radiographic appearance, there is a displacement and dislodgement of the distal interlocks, that Dr. Alvan Dame feels that she needs to get back to the operating room. He would like to take her go back to the operating room and open up the fracture site and place two cables around the fracture site and of course adjusts her interlock screws and provide longer screws that may provide some prominence medially, but they need to be bicortical to provide some support to this. Risks, benefits, and necessity of the procedure was discussed and reviewed with she and her daughter.  In the office, four views of the right femur were reviewed with them, showing the indications for this procedure. I worry based on the amount of displacement and the lack of continuity of the distal interlock that the fracture will not heal, this will provide more confidence for fracture union. Questions were encouraged and answers reviewed.      PCP: Myrlene Broker, MD  D/C Plans:      Home with HHPT/SNF  Post-op Meds:       No Rx given  Tranexamic Acid:      To be given - IV  Decadron:      Is to be given  FYI:     ASA post-op  Norco post-op   PMH: Past Medical History  Diagnosis Date  . Depression   . Nausea   . Metastatic lung cancer   . Anemia   . History of colon polyps   . Renal cell carcinoma dx'd 2001    right   . Renal cancer ([place]) dx'd 09/2010    left  . Femur fracture ([facility])     right  . Malignant neoplasm of unspecified part of unspecified bronchus or lung ([place])   . Malignant neoplasm of brain, unspecified ([place])   . Difficulty in walking, not elsewhere classified   . Muscle weakness   . History of blood transfusion   . Multiple falls     PSH: Past Surgical History  Procedure Laterality Date  . Nephrectomy      right  . Rotator cuff repair      right  . Foot surgery    . Lung lobectomy    . Total hip arthroplasty      bilat   . Eus  04/10/2011    Procedure: UPPER ENDOSCOPIC ULTRASOUND (EUS) LINEAR;  Surgeon:  Owens Loffler, MD;  Location: [facility];  Service: Endoscopy;  Laterality: N/A;  . Femur im nail Right 04/02/2015    Procedure: INTRAMEDULLARY (IM) NAIL FEMORAL;  Surgeon: Paralee Cancel, MD;  Location: [facility];  Service: Orthopedics;  Laterality: Right;  . Hardware removal Right 04/02/2015    Procedure: HARDWARE REMOVAL;  Surgeon: Paralee Cancel, MD;  Location: [facility];  Service: Orthopedics;  Laterality: Right;  . Wrist fracture surgery      bilat     Social History:  reports that she has never smoked. She has never used smokeless tobacco. She reports that she does not drink alcohol or use illicit drugs.  Allergies:  No Known Allergies  Medications: No current facility-administered medications for this encounter.   Current Outpatient Prescriptions  Medication Sig Dispense Refill  . aspirin EC 325 MG tablet Take 1 tablet (325 mg total) by mouth daily. 30 tablet 0  .  buPROPion (WELLBUTRIN XL) 150 MG 24 hr tablet Take 150 mg by mouth daily.    . calcium-vitamin D (OSCAL-500) 500-400 MG-UNIT tablet Take 1 tablet by mouth daily.    Marland Kitchen dexamethasone (DECADRON) 4 MG tablet Take 1 tablet (4 mg total) by mouth daily.    . DULoxetine (CYMBALTA) 30 MG capsule Take 30 mg by mouth daily.    . ferrous sulfate 325 (65 FE) MG tablet Take 325 mg by mouth daily with breakfast.    . HYDROcodone-acetaminophen (NORCO) 5-325 MG tablet Take 1-2 tablets by mouth every 6 (six) hours as needed. 90 tablet 0  . megestrol (MEGACE) 400 MG/10ML suspension Take 10 mLs (400 mg total) by mouth 2 (two) times daily. 240 mL 0  . Omega-3 Fatty Acids (FISH OIL) 1000 MG CAPS Take 1,000 mg by mouth 2 (two) times daily.    . polyethylene glycol (MIRALAX / GLYCOLAX) packet Take 17 g by mouth daily as needed for mild constipation. 14 each 0  . Multiple Vitamin (MULTIVITAMIN WITH MINERALS) TABS tablet Take 1 tablet by mouth daily.      Results for orders placed or performed during the hospital encounter of 05/01/15 (from the past 48 hour(s))  APTT     Status: None   Collection Time: 05/01/15 11:10 AM  Result Value Ref Range   aPTT 26 24 - 37 seconds  Basic metabolic panel     Status: Abnormal   Collection Time: 05/01/15 11:10 AM  Result Value Ref Range   Sodium 138 135 - 145 mmol/L   Potassium 4.5 3.5 - 5.1 mmol/L   Chloride 109 101 - 111 mmol/L   CO2 21 (L) 22 - 32 mmol/L   Glucose, Bld 104 (H) 65 - 99 mg/dL   BUN 63 (H) 6 - 20 mg/dL   Creatinine, Ser 1.09 (H) 0.44 - 1.00 mg/dL   Calcium 9.0 8.9 - 10.3 mg/dL   GFR calc non Af Amer 47 (L) >60 mL/min   GFR calc Af Amer 54 (L) >60 mL/min    Comment: (NOTE) The eGFR has been calculated using the CKD EPI equation. This calculation has not been validated in all clinical situations. eGFR's persistently <60 mL/min signify possible Chronic Kidney Disease.    Anion gap 8 5 - 15  CBC     Status: Abnormal   Collection Time: 05/01/15 11:10 AM   Result Value Ref Range   WBC 8.4 4.0 - 10.5 K/uL   RBC 3.43 (L) 3.87 - 5.11 MIL/uL   Hemoglobin 10.0 (L) 12.0 - 15.0 g/dL   HCT  32.1 (L) 36.0 - 46.0 %   MCV 93.6 78.0 - 100.0 fL   MCH 29.2 26.0 - 34.0 pg   MCHC 31.2 30.0 - 36.0 g/dL   RDW 16.4 (H) 11.5 - 15.5 %   Platelets 315 150 - 400 K/uL  Protime-INR     Status: None   Collection Time: 05/01/15 11:10 AM  Result Value Ref Range   Prothrombin Time 15.2 11.6 - 15.2 seconds   INR 1.18 0.00 - 1.49  Surgical pcr screen     Status: Abnormal   Collection Time: 05/01/15 11:22 AM  Result Value Ref Range   MRSA, PCR POSITIVE (A) NEGATIVE    Comment: RESULT CALLED TO, READ BACK BY AND VERIFIED WITH: SHARON SHOFFNER [id number] @ 1540 BY J SCOTTON    Staphylococcus aureus POSITIVE (A) NEGATIVE    Comment:        The Xpert SA Assay (FDA approved for NASAL specimens in patients over 1 years of age), is one component of a comprehensive surveillance program.  Test performance has been validated by [facility] for patients greater than or equal to 77 year old. It is not intended to diagnose infection nor to guide or monitor treatment. RESULT CALLED TO, READ BACK BY AND VERIFIED WITH: [facility] [id number] @ 1540 BY J SCOTTON    Dg Chest 2 View  05/01/2015  CLINICAL DATA:  Preop. Right femur fracture. Metastatic lung cancer. EXAM: CHEST  2 VIEW COMPARISON:  04/01/2015 chest radiograph and 07/19/2014 chest CT FINDINGS: The cardiomediastinal silhouette is unchanged and within normal limits. The lungs are hyperinflated. Scattered bilateral lung nodules do not appear significantly changed from the prior radiograph, with the largest measuring 1.9 cm in the right base. There is no evidence of acute airspace consolidation, edema, sizable pleural effusion, or pneumothorax. Mild biapical pleural thickening is noted. Surgical clips are present in the right upper abdomen. No acute osseous abnormality is identified. IMPRESSION: 1. No evidence of active  cardiopulmonary disease. 2. Similar appearance of bilateral lung nodules consistent with known metastatic lung cancer. Electronically Signed   By: Logan Bores M.D.   On: 05/01/2015 12:01     Review of Systems  Constitutional: Negative.   HENT: Negative.   Eyes: Negative.   Respiratory: Negative.   Cardiovascular: Negative.   Gastrointestinal: Negative.   Genitourinary: Negative.   Musculoskeletal: Positive for joint pain.  Skin: Negative.   Neurological: Negative.   Endo/Heme/Allergies: Negative.   Psychiatric/Behavioral: Positive for depression.       Physical Exam  Constitutional: She is oriented to person, place, and time. She appears well-developed.  HENT:  Head: Normocephalic.  Eyes: Pupils are equal, round, and reactive to light.  Neck: Neck supple. No JVD present. No tracheal deviation present. No thyromegaly present.  Cardiovascular: Normal rate, regular rhythm, normal heart sounds and intact distal pulses.   Respiratory: Effort normal and breath sounds normal. No stridor. No respiratory distress. She has no wheezes.  GI: Soft. There is no tenderness. There is no guarding.  Musculoskeletal:       Right hip: She exhibits decreased range of motion, decreased strength, tenderness and bony tenderness.  Lymphadenopathy:    She has no cervical adenopathy.  Neurological: She is alert and oriented to person, place, and time.  Skin: Skin is warm and dry.  Psychiatric: She has a normal mood and affect.       Assessment/Plan Assessment:    Failed right femur ORIF   Plan: Patient will undergo a revision  of the previous ORIF of the right femur on 05/03/2015 per Dr. Alvan Dame at [facility]. Risks benefits and expectations were discussed with the patient. Patient understand risks, benefits and expectations and wishes to proceed.   West Pugh Khori Underberg   PA-C  05/02/2015, 9:55 PM

## 2015-05-03 ENCOUNTER — Encounter (HOSPITAL_COMMUNITY): Payer: Self-pay | Admitting: *Deleted

## 2015-05-03 ENCOUNTER — Inpatient Hospital Stay (HOSPITAL_COMMUNITY): Payer: PPO | Admitting: Certified Registered Nurse Anesthetist

## 2015-05-03 ENCOUNTER — Inpatient Hospital Stay (HOSPITAL_COMMUNITY)
Admission: RE | Admit: 2015-05-03 | Discharge: 2015-05-05 | DRG: 481 | Disposition: A | Discharge status: Skilled Nursing Facility | Payer: PPO | Source: Ambulatory Visit | Attending: Orthopedic Surgery | Admitting: Orthopedic Surgery

## 2015-05-03 ENCOUNTER — Encounter (HOSPITAL_COMMUNITY)
Admission: RE | Disposition: A | Discharge status: Skilled Nursing Facility | Payer: Self-pay | Source: Ambulatory Visit | Attending: Orthopedic Surgery

## 2015-05-03 ENCOUNTER — Inpatient Hospital Stay (HOSPITAL_COMMUNITY): Payer: PPO

## 2015-05-03 DIAGNOSIS — F329 Major depressive disorder, single episode, unspecified: Secondary | ICD-10-CM | POA: Diagnosis present

## 2015-05-03 DIAGNOSIS — S72491P Other fracture of lower end of right femur, subsequent encounter for closed fracture with malunion: Principal | ICD-10-CM

## 2015-05-03 DIAGNOSIS — R262 Difficulty in walking, not elsewhere classified: Secondary | ICD-10-CM | POA: Diagnosis present

## 2015-05-03 DIAGNOSIS — Z85841 Personal history of malignant neoplasm of brain: Secondary | ICD-10-CM | POA: Diagnosis not present

## 2015-05-03 DIAGNOSIS — M6281 Muscle weakness (generalized): Secondary | ICD-10-CM | POA: Diagnosis present

## 2015-05-03 DIAGNOSIS — M810 Age-related osteoporosis without current pathological fracture: Secondary | ICD-10-CM | POA: Diagnosis present

## 2015-05-03 DIAGNOSIS — Z9889 Other specified postprocedural states: Secondary | ICD-10-CM

## 2015-05-03 DIAGNOSIS — Z85528 Personal history of other malignant neoplasm of kidney: Secondary | ICD-10-CM | POA: Diagnosis not present

## 2015-05-03 DIAGNOSIS — Z419 Encounter for procedure for purposes other than remedying health state, unspecified: Secondary | ICD-10-CM

## 2015-05-03 DIAGNOSIS — Z22322 Carrier or suspected carrier of Methicillin resistant Staphylococcus aureus: Secondary | ICD-10-CM

## 2015-05-03 DIAGNOSIS — D62 Acute posthemorrhagic anemia: Secondary | ICD-10-CM | POA: Diagnosis not present

## 2015-05-03 DIAGNOSIS — Z86010 Personal history of colonic polyps: Secondary | ICD-10-CM | POA: Diagnosis not present

## 2015-05-03 DIAGNOSIS — Z85118 Personal history of other malignant neoplasm of bronchus and lung: Secondary | ICD-10-CM

## 2015-05-03 DIAGNOSIS — Z8781 Personal history of (healed) traumatic fracture: Secondary | ICD-10-CM

## 2015-05-03 DIAGNOSIS — S7291XP Unspecified fracture of right femur, subsequent encounter for closed fracture with malunion: Secondary | ICD-10-CM | POA: Diagnosis present

## 2015-05-03 HISTORY — PX: ORIF FEMUR FRACTURE: SHX2119

## 2015-05-03 LAB — URINALYSIS, ROUTINE W REFLEX MICROSCOPIC
BILIRUBIN URINE: NEGATIVE
GLUCOSE, UA: NEGATIVE mg/dL
HGB URINE DIPSTICK: NEGATIVE
KETONES UR: NEGATIVE mg/dL
Leukocytes, UA: NEGATIVE
Nitrite: NEGATIVE
PH: 6 (ref 5.0–8.0)
Protein, ur: NEGATIVE mg/dL
Specific Gravity, Urine: 1.02 (ref 1.005–1.030)

## 2015-05-03 LAB — TYPE AND SCREEN
ABO/RH(D): A POS
Antibody Screen: NEGATIVE

## 2015-05-03 SURGERY — OPEN REDUCTION INTERNAL FIXATION (ORIF) DISTAL FEMUR FRACTURE
Anesthesia: Monitor Anesthesia Care | Site: Leg Upper | Laterality: Right

## 2015-05-03 MED ORDER — HYDROMORPHONE HCL 1 MG/ML IJ SOLN: 0.2500 mg | INTRAMUSCULAR | Status: DC | PRN

## 2015-05-03 MED ORDER — PROPOFOL 10 MG/ML IV BOLUS
INTRAVENOUS | Status: AC
  Filled 2015-05-03: qty 20

## 2015-05-03 MED ORDER — CEFAZOLIN SODIUM-DEXTROSE 2-3 GM-% IV SOLR
2.0000 g | Freq: Four times a day (QID) | INTRAVENOUS | Status: AC
  Administered 2015-05-03 – 2015-05-04 (×2): 2 g via INTRAVENOUS
  Filled 2015-05-03 (×2): qty 50

## 2015-05-03 MED ORDER — MIDAZOLAM HCL 2 MG/2ML IJ SOLN
INTRAMUSCULAR | Status: AC
  Filled 2015-05-03: qty 2

## 2015-05-03 MED ORDER — BUPROPION HCL ER (XL) 150 MG PO TB24
150.0000 mg | ORAL_TABLET | Freq: Every day | ORAL | Status: DC
  Administered 2015-05-03 – 2015-05-05 (×3): 150 mg via ORAL
  Filled 2015-05-03 (×3): qty 1

## 2015-05-03 MED ORDER — METHOCARBAMOL 1000 MG/10ML IJ SOLN
500.0000 mg | Freq: Four times a day (QID) | INTRAVENOUS | Status: DC | PRN
  Administered 2015-05-03: 500 mg via INTRAVENOUS
  Filled 2015-05-03 (×2): qty 5

## 2015-05-03 MED ORDER — TRANEXAMIC ACID 1000 MG/10ML IV SOLN
1000.0000 mg | Freq: Once | INTRAVENOUS | Status: AC
  Administered 2015-05-03: 1000 mg via INTRAVENOUS
  Filled 2015-05-03: qty 10

## 2015-05-03 MED ORDER — PROPOFOL 10 MG/ML IV BOLUS
INTRAVENOUS | Status: DC | PRN
  Administered 2015-05-03 (×4): 10 mg via INTRAVENOUS

## 2015-05-03 MED ORDER — DEXAMETHASONE SODIUM PHOSPHATE 10 MG/ML IJ SOLN
INTRAMUSCULAR | Status: AC
  Filled 2015-05-03: qty 1

## 2015-05-03 MED ORDER — METHOCARBAMOL 500 MG PO TABS: 500.0000 mg | ORAL_TABLET | Freq: Four times a day (QID) | ORAL | Status: DC | PRN

## 2015-05-03 MED ORDER — DULOXETINE HCL 30 MG PO CPEP
30.0000 mg | ORAL_CAPSULE | Freq: Every day | ORAL | Status: DC
  Administered 2015-05-04 – 2015-05-05 (×2): 30 mg via ORAL
  Filled 2015-05-03 (×2): qty 1

## 2015-05-03 MED ORDER — ONDANSETRON HCL 4 MG/2ML IJ SOLN
INTRAMUSCULAR | Status: AC
  Filled 2015-05-03: qty 2

## 2015-05-03 MED ORDER — DOCUSATE SODIUM 100 MG PO CAPS
100.0000 mg | ORAL_CAPSULE | Freq: Two times a day (BID) | ORAL | Status: DC
  Administered 2015-05-03 – 2015-05-05 (×4): 100 mg via ORAL

## 2015-05-03 MED ORDER — ONDANSETRON HCL 4 MG PO TABS: 4.0000 mg | ORAL_TABLET | Freq: Four times a day (QID) | ORAL | Status: DC | PRN

## 2015-05-03 MED ORDER — SODIUM CHLORIDE 0.9 % IV SOLN
INTRAVENOUS | Status: DC
  Administered 2015-05-04 (×2): via INTRAVENOUS
  Filled 2015-05-03 (×7): qty 1000

## 2015-05-03 MED ORDER — POLYETHYLENE GLYCOL 3350 17 G PO PACK: 17.0000 g | PACK | Freq: Every day | ORAL | Status: DC | PRN

## 2015-05-03 MED ORDER — 0.9 % SODIUM CHLORIDE (POUR BTL) OPTIME
TOPICAL | Status: DC | PRN
  Administered 2015-05-03: 1000 mL

## 2015-05-03 MED ORDER — MIDAZOLAM HCL 5 MG/5ML IJ SOLN
INTRAMUSCULAR | Status: DC | PRN
  Administered 2015-05-03: 1 mg via INTRAVENOUS

## 2015-05-03 MED ORDER — CEFAZOLIN SODIUM-DEXTROSE 2-3 GM-% IV SOLR
2.0000 g | INTRAVENOUS | Status: AC
  Administered 2015-05-03: 2 g via INTRAVENOUS

## 2015-05-03 MED ORDER — CHLORHEXIDINE GLUCONATE 4 % EX LIQD
60.0000 mL | Freq: Once | CUTANEOUS | Status: DC
Start: 2015-05-03 — End: 2015-05-03

## 2015-05-03 MED ORDER — ASPIRIN EC 325 MG PO TBEC
325.0000 mg | DELAYED_RELEASE_TABLET | Freq: Two times a day (BID) | ORAL | Status: DC
  Administered 2015-05-04 – 2015-05-05 (×3): 325 mg via ORAL
  Filled 2015-05-03 (×5): qty 1

## 2015-05-03 MED ORDER — FENTANYL CITRATE (PF) 100 MCG/2ML IJ SOLN
INTRAMUSCULAR | Status: AC
  Filled 2015-05-03: qty 2

## 2015-05-03 MED ORDER — PROPOFOL 500 MG/50ML IV EMUL
INTRAVENOUS | Status: DC | PRN
  Administered 2015-05-03: 25 ug/kg/min via INTRAVENOUS

## 2015-05-03 MED ORDER — ONDANSETRON HCL 4 MG/2ML IJ SOLN
INTRAMUSCULAR | Status: DC | PRN
  Administered 2015-05-03: 4 mg via INTRAVENOUS

## 2015-05-03 MED ORDER — FENTANYL CITRATE (PF) 100 MCG/2ML IJ SOLN
INTRAMUSCULAR | Status: DC | PRN
  Administered 2015-05-03 (×2): 25 ug via INTRAVENOUS

## 2015-05-03 MED ORDER — ENSURE ENLIVE PO LIQD
60.0000 mL | Freq: Three times a day (TID) | ORAL | Status: DC
  Administered 2015-05-04 – 2015-05-05 (×4): 60 mL via ORAL

## 2015-05-03 MED ORDER — DEXAMETHASONE SODIUM PHOSPHATE 10 MG/ML IJ SOLN
10.0000 mg | Freq: Once | INTRAMUSCULAR | Status: AC
  Administered 2015-05-03: 10 mg via INTRAVENOUS

## 2015-05-03 MED ORDER — MENTHOL 3 MG MT LOZG: 1.0000 | LOZENGE | OROMUCOSAL | Status: DC | PRN

## 2015-05-03 MED ORDER — ONDANSETRON HCL 4 MG/2ML IJ SOLN: 4.0000 mg | Freq: Four times a day (QID) | INTRAMUSCULAR | Status: DC | PRN

## 2015-05-03 MED ORDER — LACTATED RINGERS IV SOLN
INTRAVENOUS | Status: DC | PRN
  Administered 2015-05-03 (×2): via INTRAVENOUS

## 2015-05-03 MED ORDER — CEFAZOLIN SODIUM-DEXTROSE 2-3 GM-% IV SOLR
INTRAVENOUS | Status: AC
  Filled 2015-05-03: qty 50

## 2015-05-03 MED ORDER — BUPIVACAINE IN DEXTROSE 0.75-8.25 % IT SOLN
INTRATHECAL | Status: DC | PRN
  Administered 2015-05-03: 1.7 mL via INTRATHECAL

## 2015-05-03 MED ORDER — HYDROMORPHONE HCL 1 MG/ML IJ SOLN: 0.5000 mg | INTRAMUSCULAR | Status: DC | PRN

## 2015-05-03 MED ORDER — HYDROCODONE-ACETAMINOPHEN 5-325 MG PO TABS
1.0000 | ORAL_TABLET | ORAL | Status: DC | PRN
  Administered 2015-05-04 – 2015-05-05 (×5): 1 via ORAL
  Filled 2015-05-03 (×5): qty 1

## 2015-05-03 MED ORDER — METOCLOPRAMIDE HCL 10 MG PO TABS: 5.0000 mg | ORAL_TABLET | Freq: Three times a day (TID) | ORAL | Status: DC | PRN

## 2015-05-03 MED ORDER — PHENYLEPHRINE HCL 10 MG/ML IJ SOLN
INTRAMUSCULAR | Status: DC | PRN
  Administered 2015-05-03: 40 ug via INTRAVENOUS

## 2015-05-03 MED ORDER — LACTATED RINGERS IV SOLN: INTRAVENOUS | Status: DC

## 2015-05-03 MED ORDER — METOCLOPRAMIDE HCL 5 MG/ML IJ SOLN: 5.0000 mg | Freq: Three times a day (TID) | INTRAMUSCULAR | Status: DC | PRN

## 2015-05-03 MED ORDER — ALUM & MAG HYDROXIDE-SIMETH 200-200-20 MG/5ML PO SUSP: 30.0000 mL | ORAL | Status: DC | PRN

## 2015-05-03 MED ORDER — MEGESTROL ACETATE 400 MG/10ML PO SUSP
400.0000 mg | Freq: Two times a day (BID) | ORAL | Status: DC
  Administered 2015-05-03 – 2015-05-05 (×4): 400 mg via ORAL
  Filled 2015-05-03 (×5): qty 10

## 2015-05-03 MED ORDER — FERROUS SULFATE 325 (65 FE) MG PO TABS
325.0000 mg | ORAL_TABLET | Freq: Three times a day (TID) | ORAL | Status: DC
  Administered 2015-05-04 – 2015-05-05 (×4): 325 mg via ORAL
  Filled 2015-05-03 (×7): qty 1

## 2015-05-03 MED ORDER — PHENOL 1.4 % MT LIQD: 1.0000 | OROMUCOSAL | Status: DC | PRN

## 2015-05-03 MED ORDER — VANCOMYCIN HCL IN DEXTROSE 1-5 GM/200ML-% IV SOLN
INTRAVENOUS | Status: AC
  Filled 2015-05-03: qty 200

## 2015-05-03 MED ORDER — VANCOMYCIN HCL 1000 MG IV SOLR
1000.0000 mg | Freq: Once | INTRAVENOUS | Status: AC
  Administered 2015-05-03: 1000 mg via INTRAVENOUS
  Filled 2015-05-03: qty 1000

## 2015-05-03 MED ORDER — PROMETHAZINE HCL 25 MG/ML IJ SOLN: 6.2500 mg | INTRAMUSCULAR | Status: DC | PRN

## 2015-05-03 MED ORDER — MAGNESIUM CITRATE PO SOLN: 1.0000 | Freq: Once | ORAL | Status: DC | PRN

## 2015-05-03 MED ORDER — DIPHENHYDRAMINE HCL 25 MG PO CAPS: 25.0000 mg | ORAL_CAPSULE | Freq: Four times a day (QID) | ORAL | Status: DC | PRN

## 2015-05-03 MED ORDER — BISACODYL 10 MG RE SUPP
10.0000 mg | Freq: Every day | RECTAL | Status: DC | PRN
  Administered 2015-05-05: 10 mg via RECTAL
  Filled 2015-05-03: qty 1

## 2015-05-03 SURGICAL SUPPLY — 40 items
BAG SPEC THK2 15X12 ZIP CLS (MISCELLANEOUS)
BAG ZIPLOCK 12X15 (MISCELLANEOUS) ×1 IMPLANT
BANDAGE ELASTIC 6 VELCRO ST LF (GAUZE/BANDAGES/DRESSINGS) ×2 IMPLANT
BIT DRILL 4.3MMS DISTAL GRDTED (BIT) IMPLANT
CABLE (Orthopedic Implant) ×8 IMPLANT
DRAPE C-ARM 42X120 X-RAY (DRAPES) ×2 IMPLANT
DRAPE C-ARMOR (DRAPES) ×2 IMPLANT
DRILL 4.3MMS DISTAL GRADUATED (BIT) ×3
DRSG AQUACEL AG ADV 3.5X10 (GAUZE/BANDAGES/DRESSINGS) ×2 IMPLANT
DRSG MEPILEX BORDER 4X8 (GAUZE/BANDAGES/DRESSINGS) ×2 IMPLANT
DRSG PAD ABDOMINAL 8X10 ST (GAUZE/BANDAGES/DRESSINGS) ×2 IMPLANT
DURAPREP 26ML APPLICATOR (WOUND CARE) ×3 IMPLANT
ELECT BLADE TIP CTD 4 INCH (ELECTRODE) ×2 IMPLANT
ELECT REM PT RETURN 9FT ADLT (ELECTROSURGICAL) ×3
ELECTRODE REM PT RTRN 9FT ADLT (ELECTROSURGICAL) ×1 IMPLANT
GAUZE XEROFORM 1X8 LF (GAUZE/BANDAGES/DRESSINGS) ×2 IMPLANT
GLOVE BIOGEL PI IND STRL 7.5 (GLOVE) ×1 IMPLANT
GLOVE BIOGEL PI IND STRL 8.5 (GLOVE) ×1 IMPLANT
GLOVE BIOGEL PI INDICATOR 7.5 (GLOVE) ×4
GLOVE BIOGEL PI INDICATOR 8.5 (GLOVE) ×2
GLOVE ECLIPSE 8.0 STRL XLNG CF (GLOVE) ×4 IMPLANT
GLOVE ORTHO TXT STRL SZ7.5 (GLOVE) ×4 IMPLANT
GLOVE SURG SS PI 7.5 STRL IVOR (GLOVE) ×2 IMPLANT
GOWN SPEC L3 XXLG W/TWL (GOWN DISPOSABLE) ×2 IMPLANT
GOWN STRL REUS W/TWL LRG LVL3 (GOWN DISPOSABLE) ×3 IMPLANT
GOWN STRL REUS W/TWL XL LVL3 (GOWN DISPOSABLE) ×4 IMPLANT
KIT BASIN OR (CUSTOM PROCEDURE TRAY) ×3 IMPLANT
MANIFOLD NEPTUNE II (INSTRUMENTS) ×3 IMPLANT
PACK TOTAL JOINT (CUSTOM PROCEDURE TRAY) ×3 IMPLANT
POSITIONER SURGICAL ARM (MISCELLANEOUS) ×3 IMPLANT
SCREW BONE CORTICAL 5.0X52 (Screw) ×2 IMPLANT
SCREW CORTICAL 5X56 (Screw) ×2 IMPLANT
STAPLER VISISTAT (STAPLE) ×2 IMPLANT
SUT MNCRL AB 3-0 PS2 18 (SUTURE) ×2 IMPLANT
SUT STRATAFIX 0 PDS 27 VIOLET (SUTURE) ×3
SUT VIC AB 1 CT1 36 (SUTURE) ×8 IMPLANT
SUT VIC AB 2-0 CT1 27 (SUTURE) ×9
SUT VIC AB 2-0 CT1 TAPERPNT 27 (SUTURE) ×2 IMPLANT
SUTURE STRATFX 0 PDS 27 VIOLET (SUTURE) IMPLANT
TOWEL OR 17X26 10 PK STRL BLUE (TOWEL DISPOSABLE) ×4 IMPLANT

## 2015-05-03 NOTE — Anesthesia Postprocedure Evaluation (Signed)
Anesthesia Post Note  Patient: MALEA SWILLING  Procedure(s) Performed: Procedure(s) (LRB): REVISION OPEN REDUCTION INTERNAL FIXATION  RIGHT DISTAL FEMUR FRACTURE MALUNION (Right)  Patient location during evaluation: PACU Anesthesia Type: Spinal Level of consciousness: oriented and awake and alert Pain management: pain level controlled Vital Signs Assessment: post-procedure vital signs reviewed and stable Respiratory status: spontaneous breathing, respiratory function stable and patient connected to nasal cannula oxygen Cardiovascular status: blood pressure returned to baseline and stable Postop Assessment: no headache and no backache Anesthetic complications: no    Last Vitals:  Filed Vitals:   05/03/15 1730 05/03/15 1756  BP: 111/57 121/55  Pulse: 76 78  Temp: 36.6 C 36.3 C  Resp: 12 15    Last Pain: There were no vitals filed for this visit.               Richetta Cubillos DANIEL

## 2015-05-03 NOTE — Brief Op Note (Signed)
05/03/2015  7:17 PM  PATIENT:  [name]  81 y.o. female  PRE-OPERATIVE DIAGNOSIS:  failed ORIF right distal femur fracture  POST-OPERATIVE DIAGNOSIS:  failed ORIF right distal femur fracture  PROCEDURE:  Procedure(s): REVISION OPEN REDUCTION INTERNAL FIXATION  RIGHT DISTAL FEMUR FRACTURE MALUNION (Right)  SURGEON:  Surgeon(s) and Role:    * Paralee Cancel, MD - Primary  PHYSICIAN ASSISTANT: Danae Orleans, PA-C  ANESTHESIA:   general  EBL:   300cc  BLOOD ADMINISTERED:none  DRAINS: none   LOCAL MEDICATIONS USED:  NONE  SPECIMEN:  No Specimen  DISPOSITION OF SPECIMEN:  N/A  COUNTS:  YES  TOURNIQUET:  * No tourniquets in log *  DICTATION: .Other Dictation: Dictation Number [id number]  PLAN OF CARE: Admit to inpatient   PATIENT DISPOSITION:  PACU - hemodynamically stable.   Delay start of Pharmacological VTE agent (>24hrs) due to surgical blood loss or risk of bleeding: no

## 2015-05-03 NOTE — Anesthesia Procedure Notes (Signed)
Spinal Patient location during procedure: OR Start time: 05/03/2015 2:54 PM End time: 05/03/2015 3:02 PM Staffing Anesthesiologist: Duane Boston Resident/CRNA: Darlys Gales R Performed by: resident/CRNA  Preanesthetic Checklist Completed: patient identified, site marked, surgical consent, pre-op evaluation, timeout performed, IV checked, risks and benefits discussed and monitors and equipment checked Spinal Block Patient position: sitting Prep: Betadine Patient monitoring: heart rate, continuous pulse ox and blood pressure Approach: midline Location: L3-4 Injection technique: single-shot Needle Needle type: Spinocan  Needle gauge: 22 G Needle length: 9 cm Needle insertion depth: 7 cm Assessment Sensory level: T6 Additional Notes Expiration date of kit checked and confirmed. Patient tolerated procedure well, without complications.

## 2015-05-03 NOTE — Anesthesia Preprocedure Evaluation (Addendum)
Anesthesia Evaluation  Patient identified by MRN, date of birth, ID band Patient awake    Reviewed: Allergy & Precautions, NPO status , Patient's Chart, lab work & pertinent test results  History of Anesthesia Complications Negative for: history of anesthetic complications  Airway Mallampati: II  TM Distance: >3 FB Neck ROM: Full    Dental  (+) Teeth Intact, Dental Advisory Given   Pulmonary neg pulmonary ROS,    Pulmonary exam normal        Cardiovascular negative cardio ROS Normal cardiovascular exam     Neuro/Psych PSYCHIATRIC DISORDERS Depression Metastatic brain lesions    GI/Hepatic negative GI ROS, Neg liver ROS,   Endo/Other  negative endocrine ROS  Renal/GU Renal InsufficiencyRenal diseaseMetastatic renal cancer     Musculoskeletal   Abdominal   Peds  Hematology   Anesthesia Other Findings   Reproductive/Obstetrics                           Anesthesia Physical Anesthesia Plan  ASA: III  Anesthesia Plan: MAC and Spinal   Post-op Pain Management:    Induction:   Airway Management Planned: Simple Face Mask  Additional Equipment:   Intra-op Plan:   Post-operative Plan:   Informed Consent: I have reviewed the patients History and Physical, chart, labs and discussed the procedure including the risks, benefits and alternatives for the proposed anesthesia with the patient or authorized representative who has indicated his/her understanding and acceptance.   Dental advisory given  Plan Discussed with: CRNA, Anesthesiologist and Surgeon  Anesthesia Plan Comments: (Son present during interview.)       Anesthesia Quick Evaluation                                   Anesthesia Evaluation  Patient identified by MRN, date of birth, ID band Patient awake    Reviewed: Allergy & Precautions, NPO status , Patient's Chart, lab work & pertinent test  results  Airway Mallampati: II  TM Distance: <3 FB Neck ROM: Full    Dental  (+) Teeth Intact, Dental Advisory Given   Pulmonary shortness of breath,  Lung ca with mets   Pulmonary exam normal breath sounds clear to auscultation       Cardiovascular (-) hypertension(-) angina(-) CAD and (-) Past MI negative cardio ROS Normal cardiovascular exam Rhythm:Regular Rate:Normal     Neuro/Psych PSYCHIATRIC DISORDERS Depression Demential; evidence of brain mets from [place] on decadron.    GI/Hepatic negative GI ROS, Neg liver ROS,   Endo/Other  negative endocrine ROS  Renal/GU Stage IV RCC with now evidence of brain metastases      Musculoskeletal Right femur fracture    Abdominal   Peds  Hematology  (+) Blood dyscrasia, anemia ,   Anesthesia Other Findings Day of surgery medications reviewed with the patient.  Reproductive/Obstetrics                          Anesthesia Physical Anesthesia Plan  ASA: III  Anesthesia Plan: General   Post-op Pain Management:    Induction: Intravenous  Airway Management Planned: Oral ETT  Additional Equipment:   Intra-op Plan:   Post-operative Plan: Possible Post-op intubation/ventilation  Informed Consent: I have reviewed the patients History and Physical, chart, labs and discussed the procedure including the risks, benefits and alternatives for the proposed anesthesia with the patient  or authorized representative who has indicated his/her understanding and acceptance.   Dental advisory given  Plan Discussed with: CRNA  Anesthesia Plan Comments: (Risks/benefits of general anesthesia discussed with patient including risk of damage to teeth, lips, gum, and tongue, nausea/vomiting, allergic reactions to medications, and the possibility of heart attack, stroke and death.  All patient questions answered.  Patient wishes to proceed.)       Anesthesia Quick Evaluation                                    Anesthesia Evaluation  Patient identified by MRN, date of birth, ID band Patient awake    Reviewed: Allergy & Precautions, NPO status , Patient's Chart, lab work & pertinent test results  Airway Mallampati: II  TM Distance: <3 FB Neck ROM: Full    Dental  (+) Teeth Intact, Dental Advisory Given   Pulmonary shortness of breath,  Lung ca with mets   Pulmonary exam normal breath sounds clear to auscultation       Cardiovascular (-) hypertension(-) angina(-) CAD and (-) Past MI negative cardio ROS Normal cardiovascular exam Rhythm:Regular Rate:Normal     Neuro/Psych PSYCHIATRIC DISORDERS Depression Demential; evidence of brain mets from [place] on decadron.    GI/Hepatic negative GI ROS, Neg liver ROS,   Endo/Other  negative endocrine ROS  Renal/GU Stage IV RCC with now evidence of brain metastases      Musculoskeletal Right femur fracture    Abdominal   Peds  Hematology  (+) Blood dyscrasia, anemia ,   Anesthesia Other Findings Day of surgery medications reviewed with the patient.  Reproductive/Obstetrics                          Anesthesia Physical Anesthesia Plan  ASA: III  Anesthesia Plan: General   Post-op Pain Management:    Induction: Intravenous  Airway Management Planned: Oral ETT  Additional Equipment:   Intra-op Plan:   Post-operative Plan: Possible Post-op intubation/ventilation  Informed Consent: I have reviewed the patients History and Physical, chart, labs and discussed the procedure including the risks, benefits and alternatives for the proposed anesthesia with the patient or authorized representative who has indicated his/her understanding and acceptance.   Dental advisory given  Plan Discussed with: CRNA  Anesthesia Plan Comments: (Risks/benefits of general anesthesia discussed with patient including risk of damage to teeth, lips, gum, and tongue, nausea/vomiting, allergic reactions to  medications, and the possibility of heart attack, stroke and death.  All patient questions answered.  Patient wishes to proceed.)       Anesthesia Quick Evaluation

## 2015-05-03 NOTE — Progress Notes (Signed)
Spoke w nurse , Carlean Purl, at [facility] re: meds, and  NPO status.

## 2015-05-03 NOTE — Progress Notes (Signed)
Arrived on unit approximately 1905. Pt had questionable seizure activity, witnessed by patient care tech that lasted approximately 10 seconds. Per tech pt. Had jerking like movements, RN made aware. When RN arrived in room no jerk like movements witnessed pt was drowsy and responded to stimulation. Upon arrival pt noted to be drowsy vitals were obtained by nurse. Pt responded to sternal rub, complete set of vitals taken BP 88/38 Hr 78 100% O2sat on 2L [place], respirations regular non labored no noted distress. Pupils reactive right pupil 62m brisk and left pupil 358mand sluggish. Pt positioned to left side for precaution measures, 250 NS bolus given. Dr. OlAlvan Dameotified made aware, no additional orders recieved. Pt responded to bolus Vitals on departure BP105/42, HR 79 100% O2sat on 2L [place] respiratory rate and rhythm regular. RN advised to monitor closely and notify myself and provider for worsening changes in condition.

## 2015-05-03 NOTE — Interval H&P Note (Signed)
History and Physical Interval Note:  05/03/2015 1:33 PM  [name]  has presented today for surgery, with the diagnosis of failed ORIF right distal femur fracture  The various methods of treatment have been discussed with the patient and family. After consideration of risks, benefits and other options for treatment, the patient has consented to  Procedure(s): REVISION OPEN REDUCTION INTERNAL FIXATION (ORIF) RIGHT DISTAL FEMUR FRACTURE (Right) as a surgical intervention .  The patient's history has been reviewed, patient examined, no change in status, stable for surgery.  I have reviewed the patient's chart and labs.  Questions were answered to the patient's satisfaction.     Mauri Pole

## 2015-05-03 NOTE — Transfer of Care (Signed)
Immediate Anesthesia Transfer of Care Note  Patient: [name]  Procedure(s) Performed: Procedure(s): REVISION OPEN REDUCTION INTERNAL FIXATION  RIGHT DISTAL FEMUR FRACTURE MALUNION (Right)  Patient Location: PACU  Anesthesia Type:Spinal  Level of Consciousness:  sedated, patient cooperative and responds to stimulation  Airway & Oxygen Therapy:Patient Spontanous Breathing and Patient connected to face mask oxgen  Post-op Assessment:  Report given to PACU RN and Post -op Vital signs reviewed and stable  Post vital signs:  Reviewed and stable  Last Vitals:  Filed Vitals:   05/03/15 1235  BP: 116/55  Pulse: 85  Temp: 36.4 C  Resp: 16    Complications: No apparent anesthesia complications

## 2015-05-03 NOTE — Op Note (Signed)
NAMELAVILLA, DELAMORA NO.:  [id number]  MEDICAL RECORD NO.:  [id number]  LOCATION:  1602                         FACILITY:  [facility]  PHYSICIAN:  Pietro Cassis. Alvan Dame, M.D.  DATE OF BIRTH:  Jan 14, 1933  DATE OF PROCEDURE:  05/03/2015 DATE OF DISCHARGE:                              OPERATIVE REPORT   PREOPERATIVE DIAGNOSES: 1. Failed open reduction and internal fixation. 2. Right distal periprosthetic fracture.  POSTOPERATIVE DIAGNOSES: 1. Failed open reduction and internal fixation. 2. Right distal periprosthetic fracture.  PROCEDURE:  Revision open reduction and internal fixation of right distal femur fracture with takedown of femoral nonunion.  SURGEON:  Pietro Cassis. Alvan Dame, M.D.  ASSISTANT:  Danae Orleans, PA-C.  Note that Mr. Guinevere Scarlet was present for the entirety of the case from preoperative position, perioperative management of operative extremity, general facilitation of the case, and primary wound closure.  ANESTHESIA:  General.  BLOOD LOSS:  About 300 mL.  COMPLICATION:  None apparent.  INDICATION FOR PROCEDURE:  Ms. Strider is an 81 year old female, who has been a patient of mine since she had sustained a fall and had intertrochanteric femur fracture.  She had initially gone on to heal for that index surgery, but subsequently had another fall and had a fracture in the distal third of the femur.  She had been recently revised to a long intramedullary nail bypassing the fracture site.  Initial films revealed near anatomic reduction.  She had been seen recently in the office in routine followup with radiographs revealing a displaced and angulated segment with pulling out of her distal interlocking screws.  Based on these findings, I felt it was in her best interest to try to revise this first and enhance stability of the fracture site to allow for better healing and better alignment.  Risks, benefits, discussed family as the patient has had some memory  issues recently.  She is currently in a nursing facility.  Risks of further nonunion need for future surgery, were all discussed and reviewed. Consent was obtained.  PROCEDURE IN DETAIL:  The patient was brought to the operative theater. Once adequate anesthesia, preop antibiotics, Ancef administered, she was positioned in the supine on the on the flat [place] table.  The right lower extremity was then prepped and draped in sterile fashion from the groin to the mid leg.  Her previous incision had been identified.  A time-out was performed identifying the patient, planned procedure, and extremity.  After appropriate amount of time, the right lateral thigh incision was made after assessing the location of fracture site fluoroscopically. Sharp dissection was carried down to the iliotibial band.  The iliotibial band was then incised and the vastus lateralis level lifted off the lateral intermuscular septum and retracted anteriorly.  The fracture site, nonunion material, and fracture fibrous tissue was identified.  Her bone quality is noted to be significantly osteopenic or osteoporotic at this point.  I then was able to use some bone clamps better able to reduce the fracture, still with some displacement.  With retractors in place, I elected to place 4 non cobalt chromium cables from [place] around the fracture site.  This was confirmed in both the AP and lateral planes using  fluoroscopy during the case.  Please note, prior to this, I had removed the 2 distal interlocking screws that came out without difficulty.  Once I had the fracture stabilized now with 4 cables, under fluoroscopic imaging using perfect surgical technique, I placed 2 more distal interlocks purposely leaving them longer both medially and laterally that provide fixation on the weakened cortical bone.  Final radiographs were obtained in AP and lateral planes.  I was overall satisfied with the reduction of the  fracture with the bone surfaces available for fracture union.  The wound was irrigated in normal saline solution.  The vastus lateralis was laid over top of the fracture site and the iliotibial band reapproximated using a combination of #1 Vicryl and 0 Quill suture.  The remainder of the wound was closed with 2-0 Vicryl and staples on the skin.  The skin was then cleaned, dried, and dressed sterilely using a Mepilex and an ABD to apply pressure in an Ace wrap.  She was then brought to the recovery room in stable condition, tolerating the procedure well.  May still have her be partial weightbearing in postoperative period depending on her ability to follow instructions.  Findings were reviewed with family.     Pietro Cassis Alvan Dame, M.D.     MDO/MEDQ  D:  05/03/2015  T:  05/03/2015  Job:  [id number]

## 2015-05-04 ENCOUNTER — Encounter (HOSPITAL_COMMUNITY): Payer: Self-pay | Admitting: Orthopedic Surgery

## 2015-05-04 LAB — CBC
HCT: 23.9 % — ABNORMAL LOW (ref 36.0–46.0)
HEMOGLOBIN: 7.6 g/dL — AB (ref 12.0–15.0)
MCH: 29.6 pg (ref 26.0–34.0)
MCHC: 31.8 g/dL (ref 30.0–36.0)
MCV: 93 fL (ref 78.0–100.0)
Platelets: 244 10*3/uL (ref 150–400)
RBC: 2.57 MIL/uL — AB (ref 3.87–5.11)
RDW: 15.9 % — ABNORMAL HIGH (ref 11.5–15.5)
WBC: 10.5 10*3/uL (ref 4.0–10.5)

## 2015-05-04 LAB — BASIC METABOLIC PANEL
ANION GAP: 9 (ref 5–15)
BUN: 34 mg/dL — ABNORMAL HIGH (ref 6–20)
CHLORIDE: 108 mmol/L (ref 101–111)
CO2: 19 mmol/L — ABNORMAL LOW (ref 22–32)
CREATININE: 0.97 mg/dL (ref 0.44–1.00)
Calcium: 8 mg/dL — ABNORMAL LOW (ref 8.9–10.3)
GFR calc non Af Amer: 54 mL/min — ABNORMAL LOW (ref >60)
Glucose, Bld: 121 mg/dL — ABNORMAL HIGH (ref 65–99)
Potassium: 4.7 mmol/L (ref 3.5–5.1)
SODIUM: 136 mmol/L (ref 135–145)

## 2015-05-04 MED ORDER — HYDROCODONE-ACETAMINOPHEN 5-325 MG PO TABS: 1.0000 | ORAL_TABLET | ORAL | Status: AC | PRN

## 2015-05-04 MED ORDER — POLYETHYLENE GLYCOL 3350 17 G PO PACK: 17.0000 g | PACK | Freq: Every day | ORAL | Status: AC | PRN

## 2015-05-04 MED ORDER — DOCUSATE SODIUM 100 MG PO CAPS: 100.0000 mg | ORAL_CAPSULE | Freq: Two times a day (BID) | ORAL | Status: AC

## 2015-05-04 MED ORDER — FERROUS SULFATE 325 (65 FE) MG PO TABS: 325.0000 mg | ORAL_TABLET | Freq: Three times a day (TID) | ORAL | Status: AC

## 2015-05-04 MED ORDER — ASPIRIN 325 MG PO TBEC: 325.0000 mg | DELAYED_RELEASE_TABLET | Freq: Two times a day (BID) | ORAL | Status: AC

## 2015-05-04 NOTE — Clinical Social Work Note (Signed)
Clinical Social Work Assessment  Patient Details  Name: [name] MRN: [id number] Date of Birth: [birth year]  Date of referral:  05/04/15               Reason for consult:  Facility Placement, Discharge Planning                Permission sought to share information with:  Facility Art therapist granted to share information::  Yes, Verbal Permission Granted  Name::        Agency::     Relationship::     Contact Information:     Housing/Transportation Living arrangements for the past 2 months:  [place] of Information:  Adult Children Patient Interpreter Needed:  None Criminal Activity/Legal Involvement Pertinent to Current Situation/Hospitalization:  No - Comment as needed Significant Relationships:  Adult Children, Friend Lives with:  Facility Resident Do you feel safe going back to the place where you live?  Yes Need for family participation in patient care:  Yes (Comment)  Care giving concerns:  Son is interested in looking into other possible placements.   Social Worker assessment / plan:  Pt hospitalized for pre plan Revision ORIF ( right ) . Pt admitted from [facility]. CSW consulted to assist wit d/c planning. CSW met briefly with pt and with pt's permission, contacted pt's son. Son reports that he would like a new placement for pt , if possible. Son reports that pt can return to [place] if a new placement cannot be arranged by d/c date. New SNF search initiated and bed offers are pending. Pt has Health Team Adv. Medicare which requires prior authorization. CSW will assist with this process.  Employment status:  Retired Nurse, adult PT Recommendations:  [place] / Referral to community resources:  [place]  Patient/Family's Response to care:  Son reports that he isn't sure if pt is getting the care she needs at  [facility].  Patient/Family's Understanding of and Emotional Response to Diagnosis, Current Treatment, and Prognosis:  Pt's son is aware of pt's medical status. He feels LTC will be needed following rehab.   Emotional Assessment Appearance:  Appears stated age Attitude/Demeanor/Rapport:  Other (cooperative) Affect (typically observed):  Calm, Quiet Orientation:  Oriented to Self, Oriented to Place, Oriented to Situation Alcohol / Substance use:  Not Applicable Psych involvement (Current and /or in the community):  No (Comment)  Discharge Needs  Concerns to be addressed:  Discharge Planning Concerns Readmission within the last 30 days:  No Current discharge risk:  None Barriers to Discharge:  No Barriers Identified   Luretha Rued, [place] 05/04/2015, 12:20 PM

## 2015-05-04 NOTE — Progress Notes (Signed)
While receiving report at the start of the shift, Nurse techs called out and reported that the patient was having seizure like activity . Unwitnessed by RN, but the patient appeared to be in a postictal state, and only responding to sternal rubs and pain opening eyes. Further examination both pupils were reactive but the OS was sluggish and the OD was brisk. Also noted the patient had a low pressure, 84/38. The Surgeon was called and  Rapid Response was called.   Per the M.D. And Rapid Team and bolus of NS was given. And orders to continue to monitor the pt throughout the shift. The patient came around slowly after receiving fluid bolus. Pt was able to make request and tolerated drinking fluids however responds slowly at times. R femur surgical site with dressing C/D/I, with ice pack. No order to remove foley, I will f/u with day shift. Monitoring continued per M.D. Order and unit protocol.

## 2015-05-04 NOTE — NC FL2 (Signed)
[place] LEVEL OF CARE SCREENING TOOL     IDENTIFICATION  Patient Name: [name] Birthdate: Oct 14, 1932 Sex: female Admission Date (Current Location): 05/03/2015  [facility] and [place] Number:  [place] and Address:  [facility],  [place] [address] [place]      Provider Number: [id number]  Attending Physician Name and Address:  Paralee Cancel, MD  Relative Name and Phone Number:       Current Level of Care: Hospital Recommended Level of Care: [place] Prior Approval Number:    Date Approved/Denied:   PASRR Number: [id number]  Discharge Plan: SNF    Current Diagnoses: Patient Active Problem List   Diagnosis Date Noted  . S/P right femur ORIF 05/03/2015  . Femur fracture, right, closed, with malunion, subsequent encounter 05/03/2015  . Iron deficiency anemia   . Femur fracture, right ([place]) 04/01/2015  . Brain lesion 04/01/2015  . Femur fracture ([place]) 04/01/2015  . Primary malignant neoplasm of lung metastatic to other site [facility])   . Renal cell carcinoma ([place])   . Anemia   . Closed fracture of right femur ([place])   . Renal cell cancer ([place]) 01/07/2011  . Dyspnea 08/19/2010  . Postural dizziness 08/19/2010    Orientation RESPIRATION BLADDER Height & Weight    Self, Time, Situation, Place  Normal Indwelling catheter      BEHAVIORAL SYMPTOMS/MOOD NEUROLOGICAL BOWEL NUTRITION STATUS    Convulsions/Seizures Continent Diet  AMBULATORY STATUS COMMUNICATION OF NEEDS Skin   Extensive Assist Verbally Surgical wounds                       Personal Care Assistance Level of Assistance  Bathing, Feeding, Dressing Bathing Assistance: Maximum assistance Feeding assistance: Independent Dressing Assistance: Maximum assistance     Functional Limitations Info  Sight, Hearing, Speech Sight Info: Adequate Hearing Info: Adequate Speech Info: Adequate    SPECIAL CARE FACTORS FREQUENCY  PT (By  licensed PT), OT (By licensed OT)     PT Frequency: 5 x wk OT Frequency: 5 x wk            Contractures Contractures Info: Not present    Additional Factors Info  Code Status, Psychotropic Code Status Info: Full Code             Current Medications (05/04/2015):  This is the current hospital active medication list Current Facility-Administered Medications  Medication Dose Route Frequency Provider Last Rate Last Dose  . alum & mag hydroxide-simeth (MAALOX/MYLANTA) 200-200-20 MG/5ML suspension 30 mL  30 mL Oral Q4H PRN Danae Orleans, PA-C      . aspirin EC tablet 325 mg  325 mg Oral BID Danae Orleans, PA-C   325 mg at 05/04/15 0930  . bisacodyl (DULCOLAX) suppository 10 mg  10 mg Rectal Daily PRN Danae Orleans, PA-C      . buPROPion (WELLBUTRIN XL) 24 hr tablet 150 mg  150 mg Oral Daily Danae Orleans, PA-C   150 mg at 05/04/15 0931  . diphenhydrAMINE (BENADRYL) capsule 25 mg  25 mg Oral Q6H PRN Danae Orleans, PA-C      . docusate sodium (COLACE) capsule 100 mg  100 mg Oral BID Danae Orleans, PA-C   100 mg at 05/04/15 0931  . DULoxetine (CYMBALTA) DR capsule 30 mg  30 mg Oral Daily Danae Orleans, PA-C   30 mg at 05/04/15 0931  . feeding supplement (ENSURE ENLIVE) (ENSURE ENLIVE) liquid 60 mL  60  mL Oral TID WC Danae Orleans, PA-C   60 mL at 05/04/15 0931  . ferrous sulfate tablet 325 mg  325 mg Oral TID PC Danae Orleans, PA-C   325 mg at 05/04/15 0932  . HYDROcodone-acetaminophen (NORCO/VICODIN) 5-325 MG per tablet 1-2 tablet  1-2 tablet Oral Q4H PRN Danae Orleans, PA-C   1 tablet at 05/04/15 0807  . HYDROmorphone (DILAUDID) injection 0.5-1 mg  0.5-1 mg Intravenous Q2H PRN Danae Orleans, PA-C      . magnesium citrate solution 1 Bottle  1 Bottle Oral Once PRN Danae Orleans, PA-C      . megestrol (MEGACE) 400 MG/10ML suspension 400 mg  400 mg Oral BID Danae Orleans, PA-C   400 mg at 05/04/15 0931  . menthol-cetylpyridinium (CEPACOL) lozenge 3 mg  1 lozenge Oral PRN  Danae Orleans, PA-C       Or  . phenol (CHLORASEPTIC) mouth spray 1 spray  1 spray Mouth/Throat PRN Danae Orleans, PA-C      . methocarbamol (ROBAXIN) tablet 500 mg  500 mg Oral Q6H PRN Danae Orleans, PA-C       Or  . methocarbamol (ROBAXIN) 500 mg in dextrose 5 % 50 mL IVPB  500 mg Intravenous Q6H PRN Danae Orleans, PA-C   500 mg at 05/03/15 1730  . metoCLOPramide (REGLAN) tablet 5-10 mg  5-10 mg Oral Q8H PRN Danae Orleans, PA-C       Or  . metoCLOPramide (REGLAN) injection 5-10 mg  5-10 mg Intravenous Q8H PRN Danae Orleans, PA-C      . ondansetron [facility]) tablet 4 mg  4 mg Oral Q6H PRN Danae Orleans, PA-C       Or  . ondansetron [facility]) injection 4 mg  4 mg Intravenous Q6H PRN Danae Orleans, PA-C      . polyethylene glycol (MIRALAX / GLYCOLAX) packet 17 g  17 g Oral Daily PRN Danae Orleans, PA-C      . sodium chloride 0.9 % 1,000 mL with potassium chloride 10 mEq infusion   Intravenous Continuous Danae Orleans, PA-C 75 mL/hr at 05/04/15 4967       Discharge Medications: Please see discharge summary for a list of discharge medications.  Relevant Imaging Results:  Relevant Lab Results:   Additional Information Social Security #: [id number]. MRSA + surgical pcr 05/02/15 . No encounter.  Delvin Hedeen, [place] An, LCSW

## 2015-05-04 NOTE — Progress Notes (Signed)
Patient ID: [name], female   DOB: 1932-05-17, 81 y.o.   MRN: [id number] Subjective: 1 Day Post-Op Procedure(s) (LRB): REVISION OPEN REDUCTION INTERNAL FIXATION  RIGHT DISTAL FEMUR FRACTURE MALUNION (Right)    Patient doing well, no events reported. Pleasantly confused.    Objective:   VITALS:   Filed Vitals:   05/04/15 0805 05/04/15 1100  BP: 106/54 156/54  Pulse: 73 72  Temp: 97.9 F (36.6 C) 97.5 F (36.4 C)  Resp: 16 16    Neurovascular intact Incision: dressing C/D/I  LABS  Recent Labs  05/04/15 0430  HGB 7.6*  HCT 23.9*  WBC 10.5  PLT 244     Recent Labs  05/04/15 0430  NA 136  K 4.7  BUN 34*  CREATININE 0.97  GLUCOSE 121*    No results for input(s): LABPT, INR in the last 72 hours.   Assessment/Plan: 1 Day Post-Op Procedure(s) (LRB): REVISION OPEN REDUCTION INTERNAL FIXATION  RIGHT DISTAL FEMUR FRACTURE MALUNION (Right)   Advance diet Up with therapy Discharge to SNF, FL-2 filled out. Family interested in finding different facility  ABLA, Hgb 7.6 today  Started on IRON supplement, observe, recheck labs in am

## 2015-05-04 NOTE — Care Management Note (Signed)
Case Management Note  Patient Details  Name: [name] MRN: [id number] Date of Birth: 1932/08/18  Subjective/Objective:   S/p Revision open reduction and internal fixation of right distal femur fracture                  Action/Plan: Discharge planning per CSW  Expected Discharge Date:                  Expected Discharge Plan:  Skilled Nursing Facility  In-House Referral:  Clinical Social Work  Discharge planning Services  CM Consult  Post Acute Care Choice:  NA Choice offered to:  NA  DME Arranged:  N/A DME Agency:  NA  HH Arranged:  NA HH Agency:  NA  Status of Service:  Completed, signed off  Medicare Important Message Given:    Date Medicare IM Given:    Medicare IM give by:    Date Additional Medicare IM Given:    Additional Medicare Important Message give by:     If discussed at [place] of Stay Meetings, dates discussed:    Additional Comments:  Guadalupe Maple, RN 05/04/2015, 1:40 PM

## 2015-05-04 NOTE — Progress Notes (Signed)
CSW assisting with d/c planning. CSW was unable to find new SNF in [place]. Son is aware and in agreement with pt returning to [facility] and rehab at d/c. Week end CSW will assist wit obtaining Health Team Adv authorization for SNF placement this week end if stable for d/c.  Werner Lean LCSW [phone]

## 2015-05-04 NOTE — Progress Notes (Signed)
Utilization review completed. Asako Saliba, RN, BSN. 

## 2015-05-04 NOTE — Clinical Social Work Placement (Signed)
   CLINICAL SOCIAL WORK PLACEMENT  NOTE  Date:  05/04/2015  Patient Details  Name: [name] MRN: [id number] Date of Birth: April 10, 1933  Clinical Social Work is seeking post-discharge placement for this patient at the [place] level of care (*CSW will initial, date and re-position this form in  chart as items are completed):  No   Patient/family provided with [place] Work Department's list of facilities offering this level of care within the geographic area requested by the patient (or if unable, by the patient's family).  Yes   Patient/family informed of their freedom to choose among providers that offer the needed level of care, that participate in Medicare, Medicaid or managed care program needed by the patient, have an available bed and are willing to accept the patient.  Yes   Patient/family informed of [facility]'s ownership interest in [facility] and [facility], as well as of the fact that they are under no obligation to receive care at these facilities.  PASRR submitted to EDS on       PASRR number received on       Existing PASRR number confirmed on 05/04/15     FL2 transmitted to all facilities in geographic area requested by pt/family on 05/04/15     FL2 transmitted to all facilities within larger geographic area on       Patient informed that his/her managed care company has contracts with or will negotiate with certain facilities, including the following:            Patient/family informed of bed offers received.  Patient chooses bed at       Physician recommends and patient chooses bed at      Patient to be transferred to   on  .  Patient to be transferred to facility by       Patient family notified on   of transfer.  Name of family member notified:        PHYSICIAN       Additional Comment:    _______________________________________________ [name] [phone] 05/04/2015, 12:31  PM

## 2015-05-04 NOTE — Evaluation (Signed)
Physical Therapy Evaluation Patient Details Name: [name] MRN: [id number] DOB: July 02, 1932 Today's Date: 05/04/2015   History of Present Illness  [name] is an 81 year old female with renal cell carcinoma with brain mets; fell out of bed, sustained periprosthetic fracture and s/p ORIF 11/16.  Now revision of R distal femur ORIF  Clinical Impression  Pt admitted as above and presenting with functional mobility limitations 2* decreased R LE strength/ROM, post op pain, PWB status on R LE and cognitive deficits.  Pt would benefit from follow up rehab at SNF level.    Follow Up Recommendations SNF    Equipment Recommendations  None recommended by PT    Recommendations for Other Services OT consult     Precautions / Restrictions Precautions Precautions: Fall Restrictions Weight Bearing Restrictions: Yes RLE Weight Bearing: Partial weight bearing RLE Partial Weight Bearing Percentage or Pounds: 50%      Mobility  Bed Mobility Overal bed mobility: Needs Assistance;+2 for physical assistance;+ 2 for safety/equipment Bed Mobility: Supine to Sit;Sit to Supine     Supine to sit: Max assist;+2 for physical assistance;+2 for safety/equipment Sit to supine: Max assist;+2 for physical assistance;+2 for safety/equipment   General bed mobility comments: Utilized pad to assist pt to/from EOB.  Pt able to maintain balance at EOB with UEs to assist.  Transfers Overall transfer level: Needs assistance Equipment used: Rolling walker (2 wheeled) Transfers: Sit to/from Stand Sit to Stand: Mod assist;Max assist;+2 physical assistance;+2 safety/equipment         General transfer comment: cues for transition position, LE management and use of UEs to self assist  Ambulation/Gait Ambulation/Gait assistance: +2 physical assistance;+2 safety/equipment;Mod assist Ambulation Distance (Feet): 0 Feet Assistive device: Rolling walker (2 wheeled)       General Gait Details: Pt  able to advance R LE fwd/bkwd in small increments but unable to sufficiently WB on UEs to advance L LE  Stairs            Wheelchair Mobility    Modified Rankin (Stroke Patients Only)       Balance                                             Pertinent Vitals/Pain Pain Assessment: Faces Faces Pain Scale: Hurts little more Pain Location: R LE Pain Intervention(s): Limited activity within patient's tolerance;Monitored during session;Premedicated before session;Ice applied    Home Living Family/patient expects to be discharged to:: Skilled nursing facility                      Prior Function Level of Independence: Needs assistance         Comments: Pt unable to provided reliable history     Hand Dominance        Extremity/Trunk Assessment   Upper Extremity Assessment: Generalized weakness           Lower Extremity Assessment: RLE deficits/detail;Difficult to assess due to impaired cognition         Communication   Communication: Other (comment);HOH (Limited verbalization)  Cognition Arousal/Alertness: Awake/alert Behavior During Therapy: Flat affect Overall Cognitive Status: History of cognitive impairments - at baseline       Memory: Decreased short-term memory              General Comments      Exercises General Exercises - Upper Extremity  Shoulder Flexion: AAROM;Both;15 reps;Supine      Assessment/Plan    PT Assessment Patient needs continued PT services  PT Diagnosis Difficulty walking   PT Problem List Decreased strength;Decreased range of motion;Decreased activity tolerance;Decreased mobility;Decreased knowledge of use of DME;Decreased balance;Decreased cognition;Pain  PT Treatment Interventions DME instruction;Gait training;Functional mobility training;Therapeutic activities;Therapeutic exercise;Cognitive remediation;Patient/family education   PT Goals (Current goals can be found in the Care Plan  section) Acute Rehab PT Goals Patient Stated Goal: No goals stated PT Goal Formulation: With patient Time For Goal Achievement: 05/11/15 Potential to Achieve Goals: Fair    Frequency Min 3X/week   Barriers to discharge        Co-evaluation               End of Session Equipment Utilized During Treatment: Gait belt Activity Tolerance: Patient limited by fatigue Patient left: in bed;with call bell/phone within reach Nurse Communication: Mobility status         Time: 1225-8346 PT Time Calculation (min) (ACUTE ONLY): 25 min   Charges:   PT Evaluation $Initial PT Evaluation Tier I: 1 Procedure PT Treatments $Therapeutic Activity: 8-22 mins   PT G Codes:        Desira Alessandrini 2015-05-14, 1:16 PM

## 2015-05-04 NOTE — Progress Notes (Signed)
OT Cancellation Note  Patient Details Name: [name] MRN: [id number] DOB: January 01, 1933   Cancelled Treatment:    Reason Eval/Treat Not Completed: Other (comment). Pt is from a SNF and will return to a SNF for rehab. Will defer OT evaluation to that venue.  Dj Senteno 05/04/2015, 12:54 PM  Lesle Chris, OTR/L [phone] 05/04/2015

## 2015-05-04 NOTE — Discharge Summary (Signed)
Physician Discharge Summary  Patient ID: [name] MRN: [id number] DOB/AGE: Jul 12, 1932 81 y.o.  Admit date: 05/03/2015 Discharge date:  05/05/2015   Procedures:  Procedure(s) (LRB): REVISION OPEN REDUCTION INTERNAL FIXATION  RIGHT DISTAL FEMUR FRACTURE MALUNION (Right)  Attending Physician:  Dr. Paralee Cancel   Admission Diagnoses:   Failed right femur ORIF   Discharge Diagnoses:  Principal Problem:   S/P right femur ORIF Active Problems:   Femur fracture, right, closed, with malunion, subsequent encounter  Past Medical History  Diagnosis Date  . Depression   . Nausea   . Metastatic lung cancer   . Anemia   . History of colon polyps   . Renal cell carcinoma dx'd 2001    right   . Renal cancer ([place]) dx'd 09/2010    left  . Femur fracture ([facility])     right  . Malignant neoplasm of unspecified part of unspecified bronchus or lung ([place])   . Malignant neoplasm of brain, unspecified ([place])   . Difficulty in walking, not elsewhere classified   . Muscle weakness   . History of blood transfusion   . Multiple falls     HPI:    [name] is an 81 year old female who followed up in the clinic for her first postop visit, roughly 16 days out from an open reduction and internal fixation of her right distal periprosthetic femur fracture. This was a fracture below a previously placed trochanteric nail that Dr. Alvan Dame exchanged out for a longer nail. She came in for routine followup. There have been no obvious postoperative complications that she can report despite the radiographic findings today. No fevers, chills, night sweats. She is seen and evaluated in the office. She is in a wheelchair. She has been limited in weightbearing per our restrictions. Knee immobilizer removed. Staples removed. She has no signs of any wound infection or drainage. Dr. Alvan Dame reviewed with [name] her current situation as well as her daughter that at this point, based on her  radiographic appearance, there is a displacement and dislodgement of the distal interlocks, that Dr. Alvan Dame feels that she needs to get back to the operating room. He would like to take her go back to the operating room and open up the fracture site and place two cables around the fracture site and of course adjusts her interlock screws and provide longer screws that may provide some prominence medially, but they need to be bicortical to provide some support to this. Risks, benefits, and necessity of the procedure was discussed and reviewed with she and her daughter.  In the office, four views of the right femur were reviewed with them, showing the indications for this procedure. I worry based on the amount of displacement and the lack of continuity of the distal interlock that the fracture will not heal, this will provide more confidence for fracture union. Questions were encouraged and answers reviewed.   PCP: Myrlene Broker, MD   Discharged Condition: good  Hospital Course:  Patient underwent the above stated procedure on 05/03/2015. Patient tolerated the procedure well and brought to the recovery room in good condition and subsequently to the floor.  POD #1 BP: 156/54 ; Pulse: 72 ; Temp: 97.5 F (36.4 C) ; Resp: 16 Patient doing well, no events reported. Pleasantly confused. Neurovascular intact and incision: dressing C/D/I  LABS  Basename    HGB     7.6  HCT     23.9   POD #2  BP: 117/54 ;  Pulse: 78 ; Temp: 98.8 F (37.1 C) ; Resp: 14 Doing well, no events. Hgb stable and pt asymptomatic  LABS  Basename    HGB     7.5  HCT     23.6     LABS   No new labs  Discharge Exam: General appearance: alert and no distress Extremities: Homans sign is negative, no sign of DVT, no edema, redness or tenderness in the calves or thighs and no ulcers, gangrene or trophic changes  Disposition:    Skilled nursing facility  with follow up in 2 weeks   Follow-up Information    Follow up with  Mauri Pole, MD. Schedule an appointment as soon as possible for a visit in 2 weeks.   Specialty:  Orthopedic Surgery   Contact information:   [address] [place] [address] [phone]       Discharge Instructions    Call MD / Call 911    Complete by:  As directed   If you experience chest pain or shortness of breath, CALL 911 and be transported to the hospital emergency room.  If you develope a fever above 101 F, pus (white drainage) or increased drainage or redness at the wound, or calf pain, call your surgeon's office.     Constipation Prevention    Complete by:  As directed   Drink plenty of fluids.  Prune juice may be helpful.  You may use a stool softener, such as Colace (over the counter) 100 mg twice a day.  Use MiraLax (over the counter) for constipation as needed.     Diet - low sodium heart healthy    Complete by:  As directed      Discharge instructions    Complete by:  As directed   Maintain surgical dressing until follow up in the clinic. If the edges start to pull up, may reinforce with tape. If the dressing is no longer working, may remove and cover with gauze and tape, but must keep the area dry and clean.  Follow up in 2 weeks at [facility]. Call with any questions or concerns.     Partial weight bearing    Complete by:  As directed   % Body Weight:  50  Laterality:  right  Extremity:  Lower             Medication List    TAKE these medications        aspirin 325 MG EC tablet  Take 1 tablet (325 mg total) by mouth 2 (two) times daily.     buPROPion 150 MG 24 hr tablet  Commonly known as:  WELLBUTRIN XL  Take 150 mg by mouth daily.     calcium-vitamin D 500-400 MG-UNIT tablet  Commonly known as:  OSCAL-500  Take 1 tablet by mouth daily.     dexamethasone 4 MG tablet  Commonly known as:  DECADRON  Take 1 tablet (4 mg total) by mouth daily.     docusate sodium 100 MG capsule  Commonly known as:  COLACE  Take 1  capsule (100 mg total) by mouth 2 (two) times daily.     DULoxetine 30 MG capsule  Commonly known as:  CYMBALTA  Take 30 mg by mouth daily.     ENSURE  Take 60 mLs by mouth 3 (three) times daily with meals.     ferrous sulfate 325 (65 FE) MG tablet  Take 1 tablet (325 mg total) by mouth 3 (  three) times daily after meals.     Fish Oil 1000 MG Caps  Take 1,000 mg by mouth 2 (two) times daily.     HYDROcodone-acetaminophen 5-325 MG tablet  Commonly known as:  NORCO/VICODIN  Take 1-2 tablets by mouth every 4 (four) hours as needed for moderate pain.     megestrol 400 MG/10ML suspension  Commonly known as:  MEGACE  Take 10 mLs (400 mg total) by mouth 2 (two) times daily.     multivitamin with minerals Tabs tablet  Take 1 tablet by mouth daily.     polyethylene glycol packet  Commonly known as:  MIRALAX / GLYCOLAX  Take 17 g by mouth daily as needed for mild constipation.     PROSTAT PO  Take 30 mLs by mouth 3 (three) times daily with meals.         Signed: West Pugh. Babish   PA-C  05/04/2015, 10:09 PM

## 2015-05-05 LAB — BASIC METABOLIC PANEL
Anion gap: 7 (ref 5–15)
BUN: 34 mg/dL — ABNORMAL HIGH (ref 6–20)
CO2: 20 mmol/L — ABNORMAL LOW (ref 22–32)
Calcium: 8.1 mg/dL — ABNORMAL LOW (ref 8.9–10.3)
Chloride: 109 mmol/L (ref 101–111)
Creatinine, Ser: 1.21 mg/dL — ABNORMAL HIGH (ref 0.44–1.00)
GFR calc Af Amer: 48 mL/min — ABNORMAL LOW (ref >60)
GFR calc non Af Amer: 41 mL/min — ABNORMAL LOW (ref >60)
Glucose, Bld: 121 mg/dL — ABNORMAL HIGH (ref 65–99)
Potassium: 4.7 mmol/L (ref 3.5–5.1)
Sodium: 136 mmol/L (ref 135–145)

## 2015-05-05 LAB — CBC
HCT: 23.6 % — ABNORMAL LOW (ref 36.0–46.0)
Hemoglobin: 7.5 g/dL — ABNORMAL LOW (ref 12.0–15.0)
MCH: 29.2 pg (ref 26.0–34.0)
MCHC: 31.8 g/dL (ref 30.0–36.0)
MCV: 91.8 fL (ref 78.0–100.0)
Platelets: 226 10*3/uL (ref 150–400)
RBC: 2.57 MIL/uL — ABNORMAL LOW (ref 3.87–5.11)
RDW: 16 % — ABNORMAL HIGH (ref 11.5–15.5)
WBC: 8.3 10*3/uL (ref 4.0–10.5)

## 2015-05-05 NOTE — Plan of Care (Signed)
Problem: Education: Goal: Knowledge of  General Education information/materials will improve Outcome: Completed/Met Date Met:  05/05/15 Report given to [facility].  Problem: Health Behavior/Discharge Planning: Goal: Ability to manage health-related needs will improve Outcome: Adequate for Discharge SNF  Problem: Fluid Volume: Goal: Ability to maintain a balanced intake and output will improve Outcome: Completed/Met Date Met:  05/05/15 SNF  Problem: Nutrition: Goal: Adequate nutrition will be maintained Outcome: Completed/Met Date Met:  05/05/15 SNF  Problem: Education: Goal: Knowledge of the prescribed therapeutic regimen will improve Outcome: Completed/Met Date Met:  05/05/15 SNF Goal: Understanding of discharge needs will improve Outcome: Completed/Met Date Met:  05/05/15 SNF

## 2015-05-05 NOTE — Clinical Social Work Note (Signed)
CSW arranged for pt transport back to [facility] and rehab.  Pt's son aware.  Packet provided to RN.   Marland KitchenDede Query, LCSW [facility] Clinical Social Worker - Weekend Coverage cell #: [phone]

## 2015-05-05 NOTE — Clinical Social Work Note (Signed)
CSW received insurance authorization [id number] for pt to go to [facility] today.  CSW spoke with Fransisco Beau at [facility] and they can take pt today. Per pt's son pt will be transported via ambulance  .Dede Query, LCSW [facility] Clinical Social Worker - Weekend Coverage cell #: [phone]

## 2015-05-05 NOTE — Progress Notes (Signed)
Subjective: 2 Days Post-Op Procedure(s) (LRB): REVISION OPEN REDUCTION INTERNAL FIXATION  RIGHT DISTAL FEMUR FRACTURE MALUNION (Right) Patient reports pain as 3 on 0-10 scale.    Objective: Vital signs in last 24 hours: Temp:  [97.5 F (36.4 C)-98.8 F (37.1 C)] 98.8 F (37.1 C) (12/31 0657) Pulse Rate:  [72-79] 78 (12/31 0657) Resp:  [14-16] 14 (12/31 0657) BP: (114-156)/(51-54) 117/54 mmHg (12/31 0657) SpO2:  [99 %-100 %] 100 % (12/31 0657)  Intake/Output from previous day: 12/30 0701 - 12/31 0700 In: 1486.3 [P.O.:120; I.V.:1366.3] Out: 625 [Urine:625] Intake/Output this shift:     Recent Labs  05/04/15 0430 05/05/15 0443  HGB 7.6* 7.5*    Recent Labs  05/04/15 0430 05/05/15 0443  WBC 10.5 8.3  RBC 2.57* 2.57*  HCT 23.9* 23.6*  PLT 244 226    Recent Labs  05/04/15 0430 05/05/15 0443  NA 136 136  K 4.7 4.7  CL 108 109  CO2 19* 20*  BUN 34* 34*  CREATININE 0.97 1.21*  GLUCOSE 121* 121*  CALCIUM 8.0* 8.1*   No results for input(s): LABPT, INR in the last 72 hours.  Neurologically intact Neurovascular intact Intact pulses distally Incision: dressing C/D/I Compartment soft  Assessment/Plan: 2 Days Post-Op Procedure(s) (LRB): REVISION OPEN REDUCTION INTERNAL FIXATION  RIGHT DISTAL FEMUR FRACTURE MALUNION (Right) Advance diet Up with therapy Discharge to SNF when avail. Asx anemia. Good UO.  Jany Buckwalter C 05/05/2015, 8:13 AM

## 2015-05-05 NOTE — Clinical Social Work Note (Signed)
CSW left message for Fransisco Beau at [facility] to provide discharge information  .Dede Query, LCSW [facility] Clinical Social Worker - Weekend Coverage cell #: [phone]

## 2015-05-05 NOTE — Clinical Social Work Note (Signed)
CSW reviewed discharge paperwork reflecting she is ready for discharge today.  CSW called and spoke with pt's son to provide information.  CSW called and left message for pt's insurance (health team) to obtain authorization for her to go to SNF for rehab.  CSW will follow up with [facility] which is where pt was doing her rehab before this recent hospitalization.  Dede Query, LCSW [place] Worker - Weekend Coverage cell #: [phone]

## 2015-05-05 NOTE — Progress Notes (Signed)
Report called to Josph Macho at [facility]. Pt discharged via [facility] stretcher, EMT with pt. No changes in assessment. Danzel Marszalek, [employer]

## 2015-05-31 ENCOUNTER — Telehealth: Payer: Self-pay | Admitting: *Deleted

## 2015-05-31 ENCOUNTER — Encounter: Payer: Self-pay | Admitting: *Deleted

## 2015-05-31 NOTE — Telephone Encounter (Signed)
Patient's family requesting hospice. Dr Alen Blew to be the attending, hospice physicians may do symptom management and have DNR signed. Faxed dr Hazeline Junker last note 05/01/15 to [facility]

## 2015-06-14 ENCOUNTER — Encounter: Payer: Self-pay | Admitting: *Deleted

## 2015-07-04 ENCOUNTER — Ambulatory Visit: Payer: PPO | Admitting: Oncology

## 2015-08-04 DEATH — deceased

## 2015-08-08 ENCOUNTER — Telehealth: Payer: Self-pay | Admitting: Oncology

## 2015-08-08 NOTE — Telephone Encounter (Signed)
mailed death certificate to [facility].

## 2015-08-13 ENCOUNTER — Telehealth: Payer: Self-pay | Admitting: Oncology

## 2015-08-13 NOTE — Telephone Encounter (Signed)
Patient deceased - death certificate received in [facility].

## 2016-05-23 IMAGING — CR DG CHEST 2V
1 series · 1 of 1 positions shown · non-contrast
Comparison: 04/01/2015 chest radiograph and 07/19/2014 chest CT

CLINICAL DATA: Preop. Right femur fracture. Metastatic lung cancer.

EXAM:
CHEST  2 VIEW

[view not recorded]
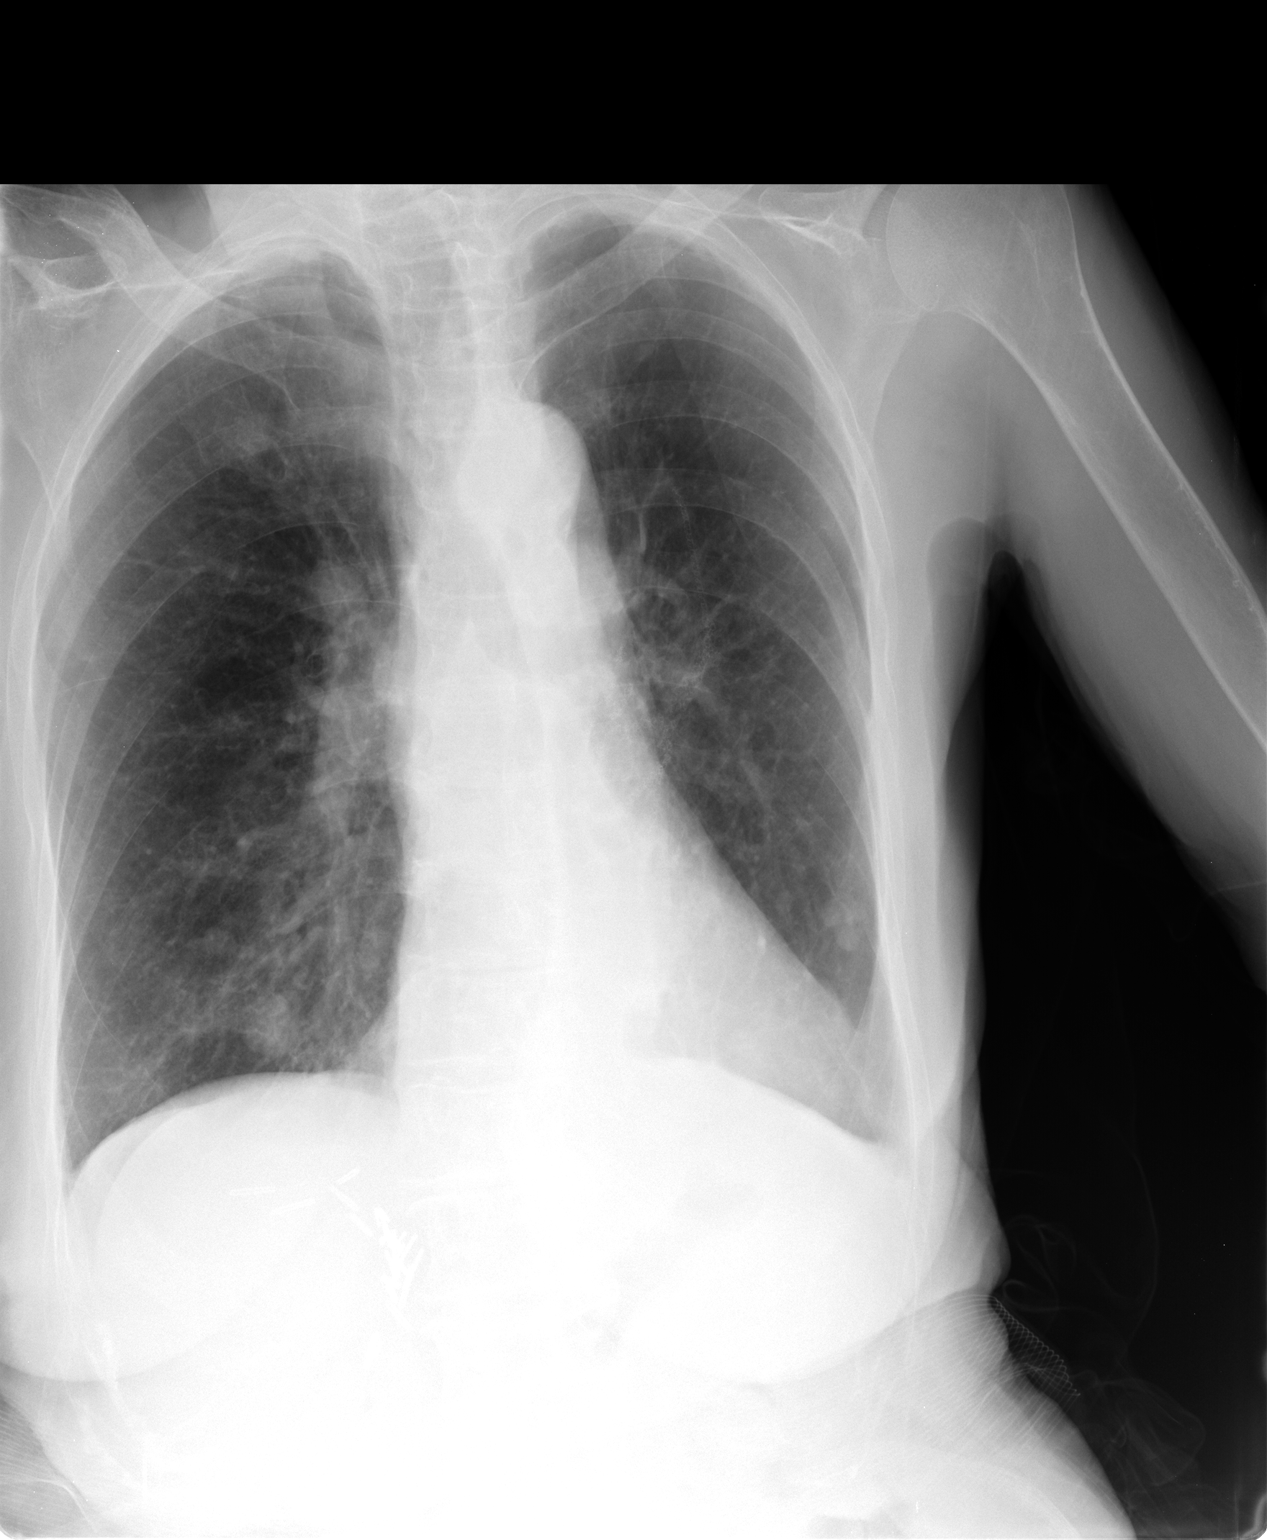

[1 of 1 positions shown; findings below may reference images not displayed]

FINDINGS: The cardiomediastinal silhouette is unchanged and within normal
limits. The lungs are hyperinflated. Scattered bilateral lung
nodules do not appear significantly changed from the prior
radiograph, with the largest measuring 1.9 cm in the right base.
There is no evidence of acute airspace consolidation, edema, sizable
pleural effusion, or pneumothorax. Mild biapical pleural thickening
is noted. Surgical clips are present in the right upper abdomen. No
acute osseous abnormality is identified.
IMPRESSION: 1. No evidence of active cardiopulmonary disease.
2. Similar appearance of bilateral lung nodules consistent with
known metastatic lung cancer.
